# Patient Record
Sex: Male | Born: 1935 | Race: Black or African American | Hispanic: No | Smoking: Former smoker
Health system: Southern US, Community
[De-identification: ages and names within clinical notes are randomized; demographics above are authoritative.]

## PROBLEM LIST (undated history)

## (undated) DIAGNOSIS — M5136 Other intervertebral disc degeneration, lumbar region: Secondary | ICD-10-CM

## (undated) DIAGNOSIS — D494 Neoplasm of unspecified behavior of bladder: Secondary | ICD-10-CM

## (undated) DIAGNOSIS — R296 Repeated falls: Secondary | ICD-10-CM

## (undated) DIAGNOSIS — R079 Chest pain, unspecified: Secondary | ICD-10-CM

## (undated) DIAGNOSIS — J189 Pneumonia, unspecified organism: Secondary | ICD-10-CM

## (undated) DIAGNOSIS — I10 Essential (primary) hypertension: Secondary | ICD-10-CM

## (undated) DIAGNOSIS — E041 Nontoxic single thyroid nodule: Secondary | ICD-10-CM

## (undated) DIAGNOSIS — R0600 Dyspnea, unspecified: Secondary | ICD-10-CM

## (undated) DIAGNOSIS — C61 Malignant neoplasm of prostate: Secondary | ICD-10-CM

## (undated) DIAGNOSIS — G5603 Carpal tunnel syndrome, bilateral upper limbs: Principal | ICD-10-CM

## (undated) DIAGNOSIS — C189 Malignant neoplasm of colon, unspecified: Secondary | ICD-10-CM

## (undated) DIAGNOSIS — R319 Hematuria, unspecified: Secondary | ICD-10-CM

## (undated) DIAGNOSIS — M51369 Other intervertebral disc degeneration, lumbar region without mention of lumbar back pain or lower extremity pain: Secondary | ICD-10-CM

## (undated) DIAGNOSIS — E789 Disorder of lipoprotein metabolism, unspecified: Secondary | ICD-10-CM

## (undated) DIAGNOSIS — I5030 Unspecified diastolic (congestive) heart failure: Secondary | ICD-10-CM

## (undated) DIAGNOSIS — H9190 Unspecified hearing loss, unspecified ear: Secondary | ICD-10-CM

## (undated) DIAGNOSIS — J90 Pleural effusion, not elsewhere classified: Secondary | ICD-10-CM

## (undated) DIAGNOSIS — I447 Left bundle-branch block, unspecified: Secondary | ICD-10-CM

## (undated) DIAGNOSIS — R42 Dizziness and giddiness: Secondary | ICD-10-CM

## (undated) DIAGNOSIS — I517 Cardiomegaly: Secondary | ICD-10-CM

## (undated) DIAGNOSIS — R410 Disorientation, unspecified: Secondary | ICD-10-CM

## (undated) DIAGNOSIS — D369 Benign neoplasm, unspecified site: Secondary | ICD-10-CM

## (undated) DIAGNOSIS — D649 Anemia, unspecified: Secondary | ICD-10-CM

## (undated) DIAGNOSIS — N183 Chronic kidney disease, stage 3 unspecified: Secondary | ICD-10-CM

## (undated) DIAGNOSIS — E119 Type 2 diabetes mellitus without complications: Secondary | ICD-10-CM

## (undated) DIAGNOSIS — M792 Neuralgia and neuritis, unspecified: Secondary | ICD-10-CM

## (undated) DIAGNOSIS — Z8719 Personal history of other diseases of the digestive system: Secondary | ICD-10-CM

## (undated) DIAGNOSIS — M1990 Unspecified osteoarthritis, unspecified site: Secondary | ICD-10-CM

## (undated) HISTORY — DX: Unspecified osteoarthritis, unspecified site: M19.90

## (undated) HISTORY — PX: EYE SURGERY: SHX253

## (undated) HISTORY — PX: COLONOSCOPY: SHX174

## (undated) HISTORY — PX: ESOPHAGOGASTRODUODENOSCOPY: SHX1529

## (undated) HISTORY — DX: Benign neoplasm, unspecified site: D36.9

## (undated) HISTORY — DX: Disorder of lipoprotein metabolism, unspecified: E78.9

## (undated) HISTORY — DX: Carpal tunnel syndrome, bilateral upper limbs: G56.03

## (undated) HISTORY — DX: Unspecified hearing loss, unspecified ear: H91.90

---
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
## 2007-09-17 ENCOUNTER — Inpatient Hospital Stay (HOSPITAL_COMMUNITY): Admission: EM | Admit: 2007-09-17 | Discharge: 2007-09-26 | Payer: Self-pay | Admitting: Emergency Medicine

## 2007-09-18 ENCOUNTER — Encounter: Payer: Self-pay | Admitting: Internal Medicine

## 2007-09-19 ENCOUNTER — Encounter (INDEPENDENT_AMBULATORY_CARE_PROVIDER_SITE_OTHER): Payer: Self-pay | Admitting: *Deleted

## 2007-09-19 ENCOUNTER — Encounter: Payer: Self-pay | Admitting: Internal Medicine

## 2007-09-21 ENCOUNTER — Encounter (INDEPENDENT_AMBULATORY_CARE_PROVIDER_SITE_OTHER): Payer: Self-pay | Admitting: *Deleted

## 2007-09-21 ENCOUNTER — Encounter (INDEPENDENT_AMBULATORY_CARE_PROVIDER_SITE_OTHER): Payer: Self-pay | Admitting: General Surgery

## 2007-09-21 HISTORY — PX: OTHER SURGICAL HISTORY: SHX169

## 2007-09-22 DIAGNOSIS — E041 Nontoxic single thyroid nodule: Secondary | ICD-10-CM

## 2007-09-22 HISTORY — DX: Nontoxic single thyroid nodule: E04.1

## 2007-09-23 ENCOUNTER — Ambulatory Visit: Payer: Self-pay | Admitting: Internal Medicine

## 2007-09-25 ENCOUNTER — Encounter (INDEPENDENT_AMBULATORY_CARE_PROVIDER_SITE_OTHER): Payer: Self-pay | Admitting: *Deleted

## 2007-09-26 ENCOUNTER — Encounter (INDEPENDENT_AMBULATORY_CARE_PROVIDER_SITE_OTHER): Payer: Self-pay | Admitting: *Deleted

## 2007-09-30 ENCOUNTER — Ambulatory Visit: Payer: Self-pay | Admitting: Hematology and Oncology

## 2007-10-21 LAB — CBC WITH DIFFERENTIAL/PLATELET
BASO%: 0 % (ref 0.0–2.0)
EOS%: 2.1 % (ref 0.0–7.0)
LYMPH%: 22.9 % (ref 14.0–48.0)
MCH: 23 pg — ABNORMAL LOW (ref 28.0–33.4)
MCHC: 32.8 g/dL (ref 32.0–35.9)
MCV: 70.1 fL — ABNORMAL LOW (ref 81.6–98.0)
MONO%: 5.4 % (ref 0.0–13.0)
Platelets: 243 10*3/uL (ref 145–400)
RBC: 4.89 10*6/uL (ref 4.20–5.71)
WBC: 6.8 10*3/uL (ref 4.0–10.0)

## 2007-10-21 LAB — COMPREHENSIVE METABOLIC PANEL
ALT: 11 U/L (ref 0–53)
Alkaline Phosphatase: 68 U/L (ref 39–117)
Creatinine, Ser: 1.41 mg/dL (ref 0.40–1.50)
Sodium: 144 mEq/L (ref 135–145)
Total Bilirubin: 0.4 mg/dL (ref 0.3–1.2)
Total Protein: 7.6 g/dL (ref 6.0–8.3)

## 2007-11-25 ENCOUNTER — Ambulatory Visit (HOSPITAL_COMMUNITY): Admission: RE | Admit: 2007-11-25 | Discharge: 2007-11-25 | Payer: Self-pay | Admitting: Urology

## 2008-01-13 ENCOUNTER — Ambulatory Visit (HOSPITAL_COMMUNITY): Admission: RE | Admit: 2008-01-13 | Discharge: 2008-01-13 | Payer: Self-pay | Admitting: Hematology and Oncology

## 2008-01-13 ENCOUNTER — Ambulatory Visit: Payer: Self-pay | Admitting: Hematology and Oncology

## 2008-01-13 ENCOUNTER — Encounter (INDEPENDENT_AMBULATORY_CARE_PROVIDER_SITE_OTHER): Payer: Self-pay | Admitting: *Deleted

## 2008-04-05 ENCOUNTER — Ambulatory Visit: Payer: Self-pay | Admitting: Hematology and Oncology

## 2008-04-07 LAB — COMPREHENSIVE METABOLIC PANEL
ALT: 14 U/L (ref 0–53)
AST: 17 U/L (ref 0–37)
Albumin: 3.8 g/dL (ref 3.5–5.2)
Alkaline Phosphatase: 65 U/L (ref 39–117)
BUN: 38 mg/dL — ABNORMAL HIGH (ref 6–23)
Potassium: 4.1 mEq/L (ref 3.5–5.3)
Sodium: 139 mEq/L (ref 135–145)
Total Protein: 7.5 g/dL (ref 6.0–8.3)

## 2008-04-07 LAB — CBC WITH DIFFERENTIAL/PLATELET
BASO%: 0.4 % (ref 0.0–2.0)
EOS%: 3 % (ref 0.0–7.0)
MCH: 26.9 pg — ABNORMAL LOW (ref 28.0–33.4)
MCHC: 34.2 g/dL (ref 32.0–35.9)
MCV: 78.8 fL — ABNORMAL LOW (ref 81.6–98.0)
MONO%: 5.9 % (ref 0.0–13.0)
RBC: 4.3 10*6/uL (ref 4.20–5.71)
RDW: 17 % — ABNORMAL HIGH (ref 11.2–14.6)

## 2008-06-22 ENCOUNTER — Ambulatory Visit (HOSPITAL_COMMUNITY): Admission: RE | Admit: 2008-06-22 | Discharge: 2008-06-22 | Payer: Self-pay | Admitting: Urology

## 2008-07-09 ENCOUNTER — Ambulatory Visit: Payer: Self-pay | Admitting: Hematology and Oncology

## 2008-07-13 ENCOUNTER — Encounter (INDEPENDENT_AMBULATORY_CARE_PROVIDER_SITE_OTHER): Payer: Self-pay | Admitting: *Deleted

## 2008-07-13 ENCOUNTER — Ambulatory Visit (HOSPITAL_COMMUNITY): Admission: RE | Admit: 2008-07-13 | Discharge: 2008-07-13 | Payer: Self-pay | Admitting: Hematology and Oncology

## 2008-07-13 ENCOUNTER — Encounter: Payer: Self-pay | Admitting: Internal Medicine

## 2008-07-13 LAB — CBC WITH DIFFERENTIAL/PLATELET
BASO%: 0.3 % (ref 0.0–2.0)
EOS%: 2.7 % (ref 0.0–7.0)
HCT: 33.8 % — ABNORMAL LOW (ref 38.7–49.9)
LYMPH%: 27.7 % (ref 14.0–48.0)
MCH: 27.7 pg — ABNORMAL LOW (ref 28.0–33.4)
MCHC: 34.2 g/dL (ref 32.0–35.9)
NEUT%: 62.7 % (ref 40.0–75.0)
Platelets: 220 10*3/uL (ref 145–400)

## 2008-07-13 LAB — COMPREHENSIVE METABOLIC PANEL
ALT: 27 U/L (ref 0–53)
AST: 28 U/L (ref 0–37)
Albumin: 3.3 g/dL — ABNORMAL LOW (ref 3.5–5.2)
BUN: 29 mg/dL — ABNORMAL HIGH (ref 6–23)
CO2: 28 mEq/L (ref 19–32)
Calcium: 9.6 mg/dL (ref 8.4–10.5)
Chloride: 101 mEq/L (ref 96–112)
Potassium: 4 mEq/L (ref 3.5–5.3)

## 2008-07-13 LAB — CEA: CEA: 1.6 ng/mL (ref 0.0–5.0)

## 2008-07-15 ENCOUNTER — Encounter: Payer: Self-pay | Admitting: Internal Medicine

## 2008-08-19 ENCOUNTER — Ambulatory Visit: Payer: Self-pay | Admitting: Internal Medicine

## 2008-08-19 DIAGNOSIS — Z85038 Personal history of other malignant neoplasm of large intestine: Secondary | ICD-10-CM

## 2008-08-19 DIAGNOSIS — C61 Malignant neoplasm of prostate: Secondary | ICD-10-CM

## 2008-08-19 DIAGNOSIS — I10 Essential (primary) hypertension: Secondary | ICD-10-CM

## 2008-08-19 DIAGNOSIS — C189 Malignant neoplasm of colon, unspecified: Secondary | ICD-10-CM | POA: Insufficient documentation

## 2008-08-19 DIAGNOSIS — E785 Hyperlipidemia, unspecified: Secondary | ICD-10-CM

## 2008-08-19 DIAGNOSIS — E119 Type 2 diabetes mellitus without complications: Secondary | ICD-10-CM | POA: Insufficient documentation

## 2008-09-09 ENCOUNTER — Encounter: Payer: Self-pay | Admitting: Internal Medicine

## 2008-09-09 ENCOUNTER — Ambulatory Visit: Payer: Self-pay | Admitting: Internal Medicine

## 2008-09-09 DIAGNOSIS — D369 Benign neoplasm, unspecified site: Secondary | ICD-10-CM

## 2008-09-09 HISTORY — DX: Benign neoplasm, unspecified site: D36.9

## 2008-09-14 ENCOUNTER — Encounter: Payer: Self-pay | Admitting: Internal Medicine

## 2009-01-03 ENCOUNTER — Ambulatory Visit: Payer: Self-pay | Admitting: Hematology and Oncology

## 2009-01-05 LAB — CBC WITH DIFFERENTIAL/PLATELET
BASO%: 0.4 % (ref 0.0–2.0)
LYMPH%: 29.8 % (ref 14.0–49.0)
MCHC: 33.8 g/dL (ref 32.0–36.0)
MONO#: 0.3 10*3/uL (ref 0.1–0.9)
MONO%: 5.3 % (ref 0.0–14.0)
Platelets: 199 10*3/uL (ref 140–400)
RBC: 4.41 10*6/uL (ref 4.20–5.82)
WBC: 5.3 10*3/uL (ref 4.0–10.3)

## 2009-01-05 LAB — COMPREHENSIVE METABOLIC PANEL
ALT: 15 U/L (ref 0–53)
AST: 17 U/L (ref 0–37)
Creatinine, Ser: 1.64 mg/dL — ABNORMAL HIGH (ref 0.40–1.50)
Total Bilirubin: 0.7 mg/dL (ref 0.3–1.2)

## 2009-01-05 LAB — CEA: CEA: 1.5 ng/mL (ref 0.0–5.0)

## 2009-05-18 ENCOUNTER — Ambulatory Visit (HOSPITAL_COMMUNITY): Admission: RE | Admit: 2009-05-18 | Discharge: 2009-05-18 | Payer: Self-pay | Admitting: Urology

## 2009-07-01 ENCOUNTER — Ambulatory Visit: Payer: Self-pay | Admitting: Hematology and Oncology

## 2009-07-05 ENCOUNTER — Ambulatory Visit (HOSPITAL_COMMUNITY): Admission: RE | Admit: 2009-07-05 | Discharge: 2009-07-05 | Payer: Self-pay | Admitting: Hematology and Oncology

## 2009-07-05 LAB — COMPREHENSIVE METABOLIC PANEL
Albumin: 3.4 g/dL — ABNORMAL LOW (ref 3.5–5.2)
Alkaline Phosphatase: 54 U/L (ref 39–117)
CO2: 28 mEq/L (ref 19–32)
Calcium: 9.5 mg/dL (ref 8.4–10.5)
Chloride: 103 mEq/L (ref 96–112)
Glucose, Bld: 166 mg/dL — ABNORMAL HIGH (ref 70–99)
Potassium: 4 mEq/L (ref 3.5–5.3)
Sodium: 138 mEq/L (ref 135–145)
Total Protein: 7.4 g/dL (ref 6.0–8.3)

## 2009-07-05 LAB — CBC WITH DIFFERENTIAL/PLATELET
Eosinophils Absolute: 0.2 10*3/uL (ref 0.0–0.5)
MONO#: 0.3 10*3/uL (ref 0.1–0.9)
MONO%: 5.5 % (ref 0.0–14.0)
NEUT#: 3.5 10*3/uL (ref 1.5–6.5)
RBC: 3.95 10*6/uL — ABNORMAL LOW (ref 4.20–5.82)
RDW: 16.4 % — ABNORMAL HIGH (ref 11.0–14.6)
WBC: 5.4 10*3/uL (ref 4.0–10.3)
lymph#: 1.4 10*3/uL (ref 0.9–3.3)

## 2009-11-05 ENCOUNTER — Observation Stay (HOSPITAL_COMMUNITY): Admission: EM | Admit: 2009-11-05 | Discharge: 2009-11-06 | Payer: Self-pay | Admitting: Emergency Medicine

## 2009-12-30 ENCOUNTER — Ambulatory Visit: Payer: Self-pay | Admitting: Hematology and Oncology

## 2010-01-03 LAB — COMPREHENSIVE METABOLIC PANEL
ALT: 21 U/L (ref 0–53)
AST: 24 U/L (ref 0–37)
Albumin: 3.3 g/dL — ABNORMAL LOW (ref 3.5–5.2)
Calcium: 9.2 mg/dL (ref 8.4–10.5)
Chloride: 103 mEq/L (ref 96–112)
Potassium: 4.3 mEq/L (ref 3.5–5.3)
Sodium: 141 mEq/L (ref 135–145)

## 2010-01-03 LAB — CBC WITH DIFFERENTIAL/PLATELET
BASO%: 0.6 % (ref 0.0–2.0)
EOS%: 3.3 % (ref 0.0–7.0)
MCH: 27.4 pg (ref 27.2–33.4)
MCHC: 33.6 g/dL (ref 32.0–36.0)
RDW: 16.7 % — ABNORMAL HIGH (ref 11.0–14.6)
lymph#: 1.9 10*3/uL (ref 0.9–3.3)

## 2010-04-07 ENCOUNTER — Emergency Department (HOSPITAL_COMMUNITY): Admission: EM | Admit: 2010-04-07 | Discharge: 2010-04-07 | Payer: Self-pay | Admitting: Emergency Medicine

## 2010-09-13 ENCOUNTER — Ambulatory Visit (HOSPITAL_COMMUNITY)
Admission: RE | Admit: 2010-09-13 | Discharge: 2010-09-13 | Payer: Self-pay | Source: Home / Self Care | Admitting: Urology

## 2010-10-03 ENCOUNTER — Ambulatory Visit (HOSPITAL_COMMUNITY)
Admission: RE | Admit: 2010-10-03 | Discharge: 2010-10-03 | Payer: Self-pay | Source: Home / Self Care | Attending: Hematology and Oncology | Admitting: Hematology and Oncology

## 2010-10-03 ENCOUNTER — Ambulatory Visit: Payer: Self-pay | Admitting: Hematology and Oncology

## 2010-10-03 LAB — CBC WITH DIFFERENTIAL/PLATELET
Basophils Absolute: 0 10*3/uL (ref 0.0–0.1)
EOS%: 3.5 % (ref 0.0–7.0)
HGB: 12.3 g/dL — ABNORMAL LOW (ref 13.0–17.1)
MCH: 27 pg — ABNORMAL LOW (ref 27.2–33.4)
MCV: 79.6 fL (ref 79.3–98.0)
MONO%: 6.9 % (ref 0.0–14.0)
RBC: 4.55 10*6/uL (ref 4.20–5.82)
RDW: 17.4 % — ABNORMAL HIGH (ref 11.0–14.6)

## 2010-10-03 LAB — COMPREHENSIVE METABOLIC PANEL
AST: 29 U/L (ref 0–37)
Albumin: 3.5 g/dL (ref 3.5–5.2)
Alkaline Phosphatase: 66 U/L (ref 39–117)
BUN: 34 mg/dL — ABNORMAL HIGH (ref 6–23)
Potassium: 3.9 mEq/L (ref 3.5–5.3)

## 2010-11-12 ENCOUNTER — Encounter: Payer: Self-pay | Admitting: Hematology and Oncology

## 2010-11-13 ENCOUNTER — Ambulatory Visit: Payer: Self-pay | Admitting: Hematology and Oncology

## 2010-11-15 LAB — CBC WITH DIFFERENTIAL/PLATELET
BASO%: 0.5 % (ref 0.0–2.0)
EOS%: 4.1 % (ref 0.0–7.0)
HCT: 36.3 % — ABNORMAL LOW (ref 38.4–49.9)
LYMPH%: 33.3 % (ref 14.0–49.0)
MCH: 26.7 pg — ABNORMAL LOW (ref 27.2–33.4)
MCHC: 33.3 g/dL (ref 32.0–36.0)
NEUT%: 55.3 % (ref 39.0–75.0)
RBC: 4.53 10*6/uL (ref 4.20–5.82)
lymph#: 2 10*3/uL (ref 0.9–3.3)

## 2011-01-07 LAB — CBC
HCT: 33.9 % — ABNORMAL LOW (ref 39.0–52.0)
Hemoglobin: 11.6 g/dL — ABNORMAL LOW (ref 13.0–17.0)
RBC: 4.19 MIL/uL — ABNORMAL LOW (ref 4.22–5.81)
RBC: 4.73 MIL/uL (ref 4.22–5.81)
WBC: 6.5 10*3/uL (ref 4.0–10.5)
WBC: 7.2 10*3/uL (ref 4.0–10.5)

## 2011-01-07 LAB — GLUCOSE, CAPILLARY
Glucose-Capillary: 114 mg/dL — ABNORMAL HIGH (ref 70–99)
Glucose-Capillary: 137 mg/dL — ABNORMAL HIGH (ref 70–99)

## 2011-01-07 LAB — DIFFERENTIAL
Eosinophils Absolute: 0.1 10*3/uL (ref 0.0–0.7)
Eosinophils Relative: 2 % (ref 0–5)
Eosinophils Relative: 2 % (ref 0–5)
Lymphocytes Relative: 27 % (ref 12–46)
Lymphs Abs: 1.7 10*3/uL (ref 0.7–4.0)
Lymphs Abs: 2.2 10*3/uL (ref 0.7–4.0)
Monocytes Absolute: 0.4 10*3/uL (ref 0.1–1.0)
Monocytes Absolute: 0.5 10*3/uL (ref 0.1–1.0)

## 2011-01-07 LAB — CARDIAC PANEL(CRET KIN+CKTOT+MB+TROPI)
CK, MB: 2.9 ng/mL (ref 0.3–4.0)
CK, MB: 3.3 ng/mL (ref 0.3–4.0)
Relative Index: 1.4 (ref 0.0–2.5)
Total CK: 159 U/L (ref 7–232)
Troponin I: 0.06 ng/mL (ref 0.00–0.06)

## 2011-01-07 LAB — COMPREHENSIVE METABOLIC PANEL
ALT: 20 U/L (ref 0–53)
AST: 30 U/L (ref 0–37)
CO2: 27 mEq/L (ref 19–32)
Calcium: 10.2 mg/dL (ref 8.4–10.5)
Chloride: 100 mEq/L (ref 96–112)
GFR calc Af Amer: 39 mL/min — ABNORMAL LOW (ref >60)
GFR calc non Af Amer: 32 mL/min — ABNORMAL LOW (ref >60)
Sodium: 139 mEq/L (ref 135–145)

## 2011-01-07 LAB — URINALYSIS, ROUTINE W REFLEX MICROSCOPIC
Glucose, UA: NEGATIVE mg/dL
Ketones, ur: NEGATIVE mg/dL
Protein, ur: NEGATIVE mg/dL

## 2011-01-07 LAB — CK TOTAL AND CKMB (NOT AT ARMC)
Relative Index: 2.2 (ref 0.0–2.5)
Total CK: 116 U/L (ref 7–232)

## 2011-01-07 LAB — BASIC METABOLIC PANEL
GFR calc Af Amer: 50 mL/min — ABNORMAL LOW (ref >60)
GFR calc non Af Amer: 41 mL/min — ABNORMAL LOW (ref >60)
Potassium: 4 mEq/L (ref 3.5–5.1)
Sodium: 136 mEq/L (ref 135–145)

## 2011-01-07 LAB — POCT CARDIAC MARKERS
CKMB, poc: 1.9 ng/mL (ref 1.0–8.0)
CKMB, poc: 3.2 ng/mL (ref 1.0–8.0)
Myoglobin, poc: 480 ng/mL (ref 12–200)
Troponin i, poc: <0.05 ng/mL (ref 0.00–0.09)

## 2011-01-07 LAB — TROPONIN I: Troponin I: 0.03 ng/mL (ref 0.00–0.06)

## 2011-01-08 LAB — CBC
MCHC: 34.3 g/dL (ref 30.0–36.0)
Platelets: 153 10*3/uL (ref 150–400)
RBC: 3.93 MIL/uL — ABNORMAL LOW (ref 4.22–5.81)
RDW: 16 % — ABNORMAL HIGH (ref 11.5–15.5)

## 2011-01-08 LAB — GLUCOSE, CAPILLARY: Glucose-Capillary: 108 mg/dL — ABNORMAL HIGH (ref 70–99)

## 2011-01-08 LAB — BASIC METABOLIC PANEL
BUN: 27 mg/dL — ABNORMAL HIGH (ref 6–23)
CO2: 26 mEq/L (ref 19–32)
Calcium: 9.1 mg/dL (ref 8.4–10.5)
Creatinine, Ser: 1.57 mg/dL — ABNORMAL HIGH (ref 0.4–1.5)
GFR calc Af Amer: 51 mL/min — ABNORMAL LOW (ref >60)
Glucose, Bld: 99 mg/dL (ref 70–99)

## 2011-03-06 NOTE — Discharge Summary (Signed)
[name], [name]               ACCOUNT NO.:  [id number]   MEDICAL RECORD NO.:  [id number]          PATIENT TYPE:  INP   LOCATION:  [id number]                         FACILITY:  [facility]   PHYSICIAN:  Doree Albee, M.D.DATE OF BIRTH:  Oct 12, 1924   DATE OF ADMISSION:  09/17/2007  DATE OF DISCHARGE:  09/26/2007                               DISCHARGE SUMMARY   FINAL DISCHARGE DIAGNOSES:  1. Colon cancer, status post right colectomy, T3 N0.  2. Hypertension.  3. Diabetes mellitus type 2.  4. Renal insufficiency, resolved.  5. Iron-deficiency anemia secondary to colon cancer as above.   MEDICATIONS ON DISCHARGE:  Glimepiride 10 mg daily, lovastatin 20 mg  daily, Norvasc 10 mg daily, metoprolol 50 mg b.i.d.   CONDITION ON DISCHARGE:  Stable.   HISTORY:  This 77 year old man presented with shortness of breath,  dizziness, weakness and melena for 2 to 3 weeks.  He was found to be  anemic and required blood transfusion of 3 units of packed red blood  cells.  Please see initial history and physical examination done by Dr.  Girard Cooter.   HOSPITAL PROGRESS:  The patient was admitted, and on admission his  hemoglobin was 7.2.  He received 3 units of blood transfusion.  He then  underwent endoscopy with gastroenterology, who found a colon cancer.  He  then was referred to the surgeons, and he was seen by Dr. Fanny Skates.  He underwent a right hemicolectomy, and this was done on  September 21, 2007.  Postoperatively he has done well without any major  problems.  He has been off his antihypertensive medications and these  have been discontinued partly because of his renal dysfunction that he  had when he presented.  This is resolved now and his medications have  been changed to leave out ACE inhibitors and diuretics.  His diabetes  has been reasonably well controlled throughout his hospital stay.  He  was seen by oncology yesterday, who will follow him as an outpatient.  They suggested  doing an ultrasound of the thyroid and check a TSH as he  has a thyroid nodule seen.  Today he is doing well and his vital signs  are stable except for a raised blood pressure of 178/70.  I am adding a  beta blocker regularly to his antihypertensive regimen.   FURTHER DISPOSITION:  He will need to follow up with the oncologist, Dr.  Jamse Arn, and also surgery, Dr. Dalbert Batman, in the near future.  He will be  going home with a rolling walker and home health physical therapy.  His  hemoglobin most recently tested on September 25, 2007 was 9.3, which is  stable.  I have encouraged him to get a primary care physician also.      Doree Albee, M.D.  Electronically Signed    NCG/MEDQ  D:  09/26/2007  T:  09/26/2007  Job:  [id number]

## 2011-03-06 NOTE — H&P (Signed)
NAME:  [name], [name] NO.:  [id number]   MEDICAL RECORD NO.:  [id number]          PATIENT TYPE:  EMS   LOCATION:  ED                           [facility]ACILITY:  [facility]   PHYSICIAN:  Girard Cooter, MD         DATE OF BIRTH:  07/14/1924   DATE OF ADMISSION:  09/17/2007  DATE OF DISCHARGE:                              HISTORY & PHYSICAL   The patient is an 77 year old African American male from out of town,  who presents to [facility] with a chief complaint of  dizziness, weakness and floaty on the feet, accompanied by shortness of  breath that has been getting progressively worse for the last 2 or 3  weeks.  The patient could no longer ambulate today without getting  dizzy.  The patient also states that he has noticed dark black, tarry  stools.  He has not had a recent colonoscopy although he has had  colonoscopy in the past.  I do not have the records in front of me to  know this.  The patient denied describing any chest pain.  He also  denied any other abdominal-type symptoms, no nausea or vomiting, no  hematemesis, no gross blood per rectum.  Otherwise, a 12-point review of  systems is negative.  No focal neurologic deficits.   PAST MEDICAL HISTORY:  1. Hypertension.  2. Hyperlipidemia.  3. Type 2 diabetes, managed with orals.   ALLERGIES:  No known drug allergies.   SOCIAL HISTORY:  The patient lives alone.  Denies drugs, alcohol or  tobacco.   MEDICATIONS:  1. Aspirin 81 mg p.o. daily.  2. Clonidine 0.3 mg p.o. daily.  3. Enalapril 20 mg p.o. daily.  4. Glimepiride 10 mg p.o. daily.  5. Hydrochlorothiazide 25 mg daily.  6. Lovastatin 20 mg p.o. q.h.s.  7. Potassium chloride 20 mEq p.o. daily.   PHYSICAL EXAMINATION:  GENERAL:  The patient is a pleasant 77 year old  African American male in no acute distress.  He is alert and oriented  x3.  HEENT:  Normocephalic, atraumatic.  PERRLA.  Extraocular muscles intact.  Sclerae are anicteric.  Mucous  membranes moist.  NECK:  Supple.  No JVD, no carotid bruits.  CARDIOVASCULAR:  S1, S2, regular rate and rhythm, no murmurs, rubs or  clicks.  ABDOMEN:  Soft, nontender, nondistended, positive bowel sounds.  RECTAL:  No obvious hemorrhoids per rectum.  Stool was tarry, black.  Stool test for occult blood was positive.  LUNGS:  Clear to auscultation bilaterally.  No rhonchi, rales or  wheezes.  EXTREMITIES:  No clubbing, cyanosis, or edema.  NEUROLOGIC: The patient is alert and oriented x3.  Cranial nerves II-XII  grossly intact.  Strength 5/5 bilaterally upper and lower extremities.   LABORATORY DATA:  Hemoglobin 7.2, hematocrit 22.8, white count 9.1,  platelets 461, neutrophils 85.  Urinalysis essentially negative.  Cardiac enzymes x1 negative.  Basic metabolic panel shows a BUN of 27  and creatinine of 1.46.  Chest x-ray shows no acute cardiopulmonary  changes.   ASSESSMENT AND PLAN:  An 77 year old with:  1. Weakness, dizziness/anemia.  Will go ahead and transfuse two units      packed red blood cells, monitor H&H q.6h., GI from [facility]      Gastroenterology will be consulted for a possible upper endoscopy      versus colonoscopy.  2. Hypertension, well-controlled.  Continue to monitor.  3. Hyperlipidemia.  Continue to monitor.  4. Diabetes type 2.  Will hold orals for now/sliding scale insulin,      Lantus at night, for basal coverage.  5. Prerenal azotemia versus secondary to gastrointestinal bleed, acute      on chronic renal insufficiency.  Go ahead and gently hydrate and      monitor electrolytes and renal function.  6. Prophylaxis:  For deep vein thrombosis will start SCD boots, hold      aspirin and all nonsteroidal anti-inflammatory drugs given the      possible gastrointestinal GI bleed.  Hold his oral diabetic      medication given his renal status.  Control blood pressure with      clonidine.  Also hold his ACE inhibitor for now.   DISPOSITION:  Workup for weakness  secondary to most likely anemia in  progress.  The patient will be discharged after GI workup and anemia  workup delineated.  Also, for anemia workup will do a full set of labs  including iron, TIBC, ferritin.  For completeness I will go ahead and  add folate and B12 and reticulocyte count.   Patient a full code.      Girard Cooter, MD  Electronically Signed     RR/MEDQ  D:  09/17/2007  T:  09/17/2007  Job:  [id number]

## 2011-03-06 NOTE — Consult Note (Signed)
NAMELOFTON, DIGIUSEPPE               ACCOUNT NO.:  [id number]   MEDICAL RECORD NO.:  [id number]          PATIENT TYPE:  INP   LOCATION:  [id number]                         [facility]ACILITY:  [facility]   PHYSICIAN:  Eston Esters, MD        DATE OF BIRTH:  10-28-1923   DATE OF CONSULTATION:  09/25/2007  DATE OF DISCHARGE:                                 CONSULTATION   REASON FOR CONSULT:  Colon cancer.   REFERRING PHYSICIAN:  Dr. Dalbert Batman.   HISTORY OF PRESENT ILLNESS:  [name] is a pleasant 77 year old  African American male asked to see for evaluation of colon cancer.  The  patient was visiting his daughter, who works in the [place] processing unit  at [employer], from out of town, when he had to be admitted on  November 26, with shortness of breath, dizziness, and weakness, as well  as a 2-3 week history of melena.  He had no prior GI problems.  The  patient was found to be significantly anemic with a hemoglobin of 7.2,  required a total of 3 units of packed RBC transfusion.  Hemoccult was  positive at the time.  He had a colonoscopy on November 28, which  demonstrated a large malignant mass in the right colon, near the cecum  versus near the ascending colon.  His EGD was negative.  CT of the  abdomen and pelvis on November 28, with contrast, showed a long segment  of irregular colonic wall thickening consistent with malignancy.  These  was located just before the ileocecal valve, extending close to the  hepatic flexure.  He had large pericolonic colonic lymph nodes, as well  as two vague enhancing liver lesions, which looked like vascular  shunting versus hemangioma, but not likely to be malignant.  No  retroperitoneal lymphadenopathy.  Pelvis CT was negative.  He had an  enlarged prostate, as well as sacroiliac joint DJD.  He underwent right  colectomy with pathology of the colon and rectum, diagnostic for  invasive adenocarcinoma, moderately differentiated, negative margins, 4  cm from the  radial margin, no perforation.  The mass maximum size was 6  cm at the ascending colon.  The mass invades through the muscularis  propria into the pericolonic adipose tissue.  No vascular or lymphatic  invasion.  He is T3 N0 MX.  There is no tumor in the 13 mesenteric lymph  nodes resected.  Based on these findings, we were asked to see the  patient with recommendations regarding his care.   PAST MEDICAL HISTORY:  1. History of prostate cancer, diagnosed 15 years ago.  2. Hypertension.  3. Hyperlipidemia.  4. Diabetes mellitus type 2.  5. Protein-calorie malnutrition on admission.  6. BPH.   SURGERY:  Status post right colectomy and resection of fatty tumor of  the ileal mesentery - resection of nodule of ileal mesentery, Dr.  Dalbert Batman, September 21, 2007.   ALLERGIES:  NKDA.   CURRENT MEDICATIONS:  1. Entereg 12 mg b.i.d.  2. Norvasc 5 mg daily.  3. Lovenox 40 mg daily.  4. NovoLog as  directed.  5. Lopressor 5 mg q.6h.  6. Protonix 40 mg b.i.d.  7. Norco 1 to 2 tablets q.3h. p.r.n.  8. Dilaudid p.r.n.  9. Zofran p.r.n.  10.Phenergan p.r.n.   REVIEW OF SYSTEMS:  Remarkable for fatigue, no apparent weight loss, no  anorexia.  No respiratory symptoms, except for mild dyspnea on exertion  secondary to fatigue.  No cardiac complaints.  He has had abdominal  pain, but now his abdominal pain is more incisional.  No nausea or  vomiting.  He had melenic stools upon admission, no bowel movements to  this date.  He has nocturia.  No gross hematuria.  No musculoskeletal  pain.  He had some weakness, generalized, but it was improved  postoperatively.  No dysesthesias.   FAMILY HISTORY:  Noncontributory for colon cancer.   SOCIAL HISTORY:  The patient is single.  He has two daughters.  Retired.  No alcohol or tobacco history.  Lives in [place], at [place], [place].   HEALTH MAINTENANCE:  He had transfusions during this admission, total 3  units.  The last 5 years, he had  a Pneumovax vaccine, and the flu  vaccine was administered October 2008.  He had never had a colonoscopy  until September 19, 2007.   PHYSICAL EXAMINATION:  This is an 77 year old, African-American male in  no acute distress, but lethargic, secondary to the hospitalization.  He  has not been sleeping during the night.  Therefore, he falls back asleep  continuously during the day.  Blood pressure 178/64, pulse 74,  respirations 16, temperature 98, pulse oximetry 94% on room air, 95.3,  height 66 inches.  HEAD:  Normocephalic, atraumatic.  PERRLA.  Oral mucosa without thrush  or lesions.  NECK:  Supple.  No cervical or supraclavicular masses.  Cannot palpate  the described thyroid nodules per CT.  LUNGS:  Clear to auscultation with the exception of a trace of rhonchi  at the bases, clear with cough.  No axillary masses.  CARDIOVASCULAR:  Regular rate and rhythm without murmurs, rubs or  gallops.  ABDOMEN:  Distended.  There is postsurgical tenderness at the incision  area, moderately obese.  No palpable spleen or liver.  GENITOURINARY AND RECTAL:  Deferred.  EXTREMITIES:  No clubbing or cyanosis.  Trace of pedal edema.  SKIN:  Without lesions, bruising or petechiae other than the incisional  region with well healing abdominal scar.  NEUROLOGICAL:  Nonfocal.   LABORATORY DATA:  Hemoglobin 9.3, hematocrit 28, white count 7.4,  platelets 270, neutrophils 7.7, MCV 70.3.  PT 14.7, PTT 34, INR 1.1,  retic count 47.  Iron 18, TIBC 357.  Percent saturation 5, ferritin 8,  [id number], folic acid [id number].  Sodium 138, potassium 3.9, BUN 3 , creatinine  1.26, glucose 163, total bilirubin 0.6, alkaline phosphatase 53, AST 21,  ALT 12, total protein 5.8, albumin 2.3, calcium 8.8.  Hemoccult on  November 26 was positive.  A1c was 5.7.  CEA on November 29 was 2.0.   RADIOLOGIC STUDIES:  See HPI.  In addition, the CT of the chest had also  mentioned on September 24, 2007 that there were no pulmonary nodules  or  masses; there was an enlarged low density right paratracheal lymph node,  nonspecific.  In addition, enlarged thyroid, with multiple nodules.   ASSESSMENT:  Dr. Truddie Coco has seen and evaluated the patient, and the chart  has been reviewed.  This is an 77 year old, African-American male, with  a new diagnosis  of colon cancer, T3 N0 MX.  The patient will be arranged  to be followed up with Dr. Jamse Arn upon discharge.  This will be taking  place as an outpatient.  Of note, to further evaluate his thyroid  enlargement, we will proceed with an ultrasound of the thyroid.  Pending  on the results, further recommendations will proceed.  Consider p.o.  iodine replacement, since his ferritin is noted to be low, indicative of  iron deficiency.   Thank you very much for allowing [place] the opportunity to participate in  the care of Mr. Hutzell.      [name], P.A.      Eston Esters, MD  Electronically Signed    SW/MEDQ  D:  09/26/2007  T:  09/26/2007  Job:  [id number]

## 2011-03-06 NOTE — Consult Note (Signed)
[name], [name]               ACCOUNT NO.:  [id number]   MEDICAL RECORD NO.:  [id number]          PATIENT TYPE:  INP   LOCATION:  [place]                         FACILITY:  [facility]   PHYSICIAN:  Edsel Petrin. Dalbert Batman, M.D.DATE OF BIRTH:  04-Jun-1924   DATE OF CONSULTATION:  DATE OF DISCHARGE:                                 CONSULTATION   REASON FOR CONSULTATION:  Evaluate neoplastic mass of right colon.   HISTORY OF PRESENT ILLNESS:  This is an 77 year old black man who lives  in [place] but whose daughter works in the [place]  processing unit here at [employer].  He was admitted to this hospital  on September 17, 2007 with shortness of breath, dizziness, weakness and  melena for possibly 2-3 weeks.  Denies any abdominal pain or vomiting.  No prior GI problems.   Following admission he was found to be anemic, and I believe he has  required transfusion of what looks like 3 units of packed cells and has  been stable since that time.  He has had a colonoscopy today, which  shows a large malignant mass in the right colon, possibly near the cecum  or possibly mid ascending colon.  He had an upper endoscopy yesterday,  which was negative.  I was called to see him.  He is evaluated in the  recovery area of the endoscopy unit.  He is awake.  His daughter is with  him at his side.  He is in no distress.   PAST HISTORY:  1. Hypertension.  2. Hyperlipidemia.  3. Type 2 diabetes.   SURGICAL HISTORY:  Negative.   CURRENT MEDICATIONS:  1. Aspirin 81 mg a day.  2. Clonidine 0.3 mg a day.  3. Enalapril 20 mg daily.  4. Glimepiride 10 mg daily.  5. Hydrochlorothiazide 25 mg daily.  6. Lovastatin 20 mg at bedtime.  7. Potassium chloride 20 mEq daily.   DRUG ALLERGIES:  None known.   SOCIAL HISTORY:  The patient [place] country in [place].  Denies drugs, alcohol or tobacco. He has two daughers who are visiting  him in the hospital.   FAMILY HISTORY:   Noncontributory.  No obvious GI diseases outlined.   PHYSICAL EXAM:  GENERAL:  A pleasant older African American male in no  acute distress.  VITAL SIGNS:  Blood pressure 153/59, pulse 62, respirations 18, oxygen  saturation 100%.  HEENT:  Sclerae clear.  Extraocular movements intact.  Ears, mouth,  throat, nose, lips, tongue and oropharynx without gross lesions.  NECK:  Supple, nontender.  No mass.  No jugular distention.  No bruits.  HEART:  Regular rate and rhythm.  No murmur.  Radial and femoral pulses  are palpable.  LUNGS:  Clear to auscultation.  No chest wall tenderness.  ABDOMEN:  Slightly protuberant, soft, nontender, nondistended.  Active  bowel sounds.  I do not feel any hernias.  He is nontender.  SKIN:  He has a chronic dermatitis but no active ulcerated lesions and  no infections.  RECTAL EXAM:  Deferred.  Previously done, which showed  hemoccult  positive melanoma.  EXTREMITIES:  He moves all four extremities well without pain or  deformity.  No edema or cyanosis.  NEUROLOGIC:  No gross motor or sensory deficits.   LAB DATA:  Admission hemoglobin 7.2.  Liver function tests normal.  Albumin 2.7.  Hemoglobin this morning is 9.4.   IMAGING:  Portable chest x-ray done on September 17, 2007 shows mild  bibasilar atelectasis.   IMPRESSION:  1. Neoplastic mass of ascending colon, almost certainly this is an      adenocarcinoma.  2. Suspect protein calorie malnutrition.  3. Hypertension.  4. Hyperlipidemia.  5. Diabetes mellitus type 2.   PLAN:  1. A CT scan of the abdomen/pelvis will be performed today.  2. We will tentatively plan for a right colectomy on Sunday, November      30 .  3. We will plan completion of his bowel prep tomorrow, November 29.  4. We will ask for medical clearance for this surgery by the [facility]      medical team.      Edsel Petrin. Dalbert Batman, M.D.  Electronically Signed     HMI/MEDQ  D:  09/19/2007  T:  09/19/2007  Job:  [id number]   cc:    Girard Cooter, MD  Fax: [phone]  Email: [email or web address], [email or web address],FACG  [facility]  [place], [place] [address]

## 2011-03-06 NOTE — H&P (Signed)
NAMEJERRION, [name]               ACCOUNT NO.:  [id number]   MEDICAL RECORD NO.:  [id number]         PATIENT TYPE:  LINP   LOCATION:                               [facility]AC[facility]   PHYSICIAN:  Girard Cooter, MD         DATE OF BIRTH:  07/01/1924   DATE OF ADMISSION:  09/17/2007  DATE OF DISCHARGE:                              HISTORY & PHYSICAL   ADDENDUM:  Please add addendum to past medical problems:  Prostate  cancer for 15 years.   Please add to history of present illness, the patient has never had a  colonoscopy.   Please add:  EKG shows normal sinus rhythm, LVH, nonspecific ST-T wave  changes.      Girard Cooter, MD  Electronically Signed     RR/MEDQ  D:  09/17/2007  T:  09/17/2007  Job:  [id number]

## 2011-03-06 NOTE — Op Note (Signed)
NAMEPOWELL, GRELLE               ACCOUNT NO.:  [id number]   MEDICAL RECORD NO.:  [id number]          PATIENT TYPE:  INP   LOCATION:  [place]                         FACILITY:  [facility]   PHYSICIAN:  Edsel Petrin. Dalbert Batman, M.D.DATE OF BIRTH:  23-Mar-1924   DATE OF PROCEDURE:  09/21/2007  DATE OF DISCHARGE:                               OPERATIVE REPORT   PREOPERATIVE DIAGNOSIS:  Carcinoma of the right colon.   POSTOPERATIVE DIAGNOSES:  1. Carcinoma right colon.  2. Fatty tumor of ileal mesentery.  3. Nodule of ileal mesentery.   OPERATIONS PERFORMED:  1. Laparoscopic-assisted right colectomy.  2. Resection of fatty tumor of ileal mesentery.  3. Resection of nodule, ileal mesentery.   SURGEON:  Fanny Skates, M.D.   FIRST ASSISTANT:  Isabel Caprice. Hassell Done, M.D.   OPERATIVE INDICATIONS:  This is an 77 year old black man who was  admitted to the hospital recently with shortness of breath, weakness,  melena, and anemia.  He underwent blood transfusion.  He had a  colonoscopy which showed a large malignant mass in the right colon.  Had  an upper endoscopy which was normal.  He had a CT scan which showed  thickening of the ascending colon and some suspicious mesenteric  adenopathy.  He has undergone completion of his bowel prep.  He has been  counseled regarding colon resection, which he agrees with.  He is  brought to the operating room electively.   OPERATIVE FINDINGS:  There was a large firm mass in the mid-ascending  colon.  There was no obvious mesenteric adenopathy.  In the mesentery of  the ileum, there was about a 6 cm fatty mass which was not a duplication  cyst and was simply excised with electrocautery and sent for pathology.  Also in the mesentery of the distal ileum, there was about a 5 mm hard  whitish nodule of uncertain etiology.  This was also excised and sent  for routine histology.  I saw no masses of the anterior and superior  surfaces of the left or right lobes of the  liver.  There was no ascites.  Small bowel was collapsed.   OPERATIVE TECHNIQUE:  Following the induction of general endotracheal  anesthesia, the patient was identified as the correct patient and the  correct procedure.  The abdomen was prepped and draped in a sterile  fashion after a Foley catheter had been inserted.  0.5% Marcaine with  epinephrine was used as a local infiltration anesthetic.  A short  vertical incision was made at the superior rim of the umbilicus.  The  fascia was incised in the midline and the abdominal cavity entered under  direct vision.  A 12 mm Hassan trocar was inserted and secured with a  pursestring suture of 0 Vicryl.  Pneumoperitoneum was created.  Video  camera was inserted, with visualization and findings as described above.  Ultimately, I put a 12 mm trocar in the lower midline, a 12 mm trocar in  the upper midline, and two 5 mm trocars, one in the right midabdomen and  one in the left midabdomen.   Following  exploration of the abdomen and pelvis, we positioned the  patient in the Trendelenburg position.  We identified the terminal  ileum, ileocecal valve, and appendix and cecum.  We mobilized the  terminal ileum and cecum and right colon by dividing their lateral  peritoneal attachments.  We used the harmonic scalpel and blunt  dissection.  We slowly mobilized the terminal ileum and cecum and right  colon well off and to the left of the midline.  We identified the  duodenum and avoided injury to that.   We then placed the patient in reverse Trendelenburg position.  We took  down some adhesions between the transverse colon and gallbladder and  took down the hepatic flexure using the harmonic scalpel until we had  the hepatic flexure completely mobilized to where it would come down  into the lower abdomen.  At this point, we thought we had enough  mobilization, and hemostasis was good.  We released the  pneumoperitoneum.  We made a midline incision,  connecting the two port  sites above the umbilicus.  This incision was about 7 or 8 cm in length.  We incised the fascia in the midline.  We placed a wound protector in  the wound.  We brought the terminal ileum and right colon and hepatic  flexure out through the incision, and we had good visualization.  We  also looked at the terminal ileum, and we found a fatty tumor of the mid-  to distal ileum and the mesentery near the ileum.  This was resected  with sharp dissection and electrocautery and was not invading the ileum  or the mesentery.  It looked like a benign fatty tumor.  It was about  this 5 or 6 cm in diameter.  It was sent for routine histology.  Also,  in the terminal ileum mesentery there was about a 5 mm hard whitish  nodule that looked more like a calcified benign mass, but I could not be  sure was not cancer, and so I excised this with electrocautery and sent  it for routine histology.   We examined the specimen that was exteriorized and found that we could  palpate a large tumor, probably 5 cm or greater, in the mid-ascending  colon.  We transected the terminal ileum with a GIA stapling device.  We  transected the right transverse colon with a GIA stapling device.  We  took the mesentery down.  Larger vessels were clamped, divided, and  ligated with 2-0 silk suture ligatures.  Smaller vessels were controlled  with the LigaSure device.  We had excellent hemostasis.  We sent the  specimen to the lab for routine histology.   An anastomosis was created to the terminal ileum and the transverse  colon  using the GIA stapling device.  The defect in the bowel wall was  closed with the TA-60 stapling device.  We inspected the anastomosis  prior to closure, and there was no bleeding.  The proximal and distal  bowel looked pink and healthy.  We placed a few extra sutures of 3-0  silk in the staple lines for hemostasis and at critical points.   We then changed our instruments and  gloves.  We irrigated out the area  of the dissection.  There was good hemostasis by this time.  The  mesentery was closed with interrupted sutures of 2-0 silk.  We returned  the anastomosis to the abdominal cavity.  The fascia was closed with a  running  suture of #1 PDS, and the skin closed with skin staples.   We reinsufflated and reinserted the video camera.  We irrigated a little  bit and in the subphrenic and subhepatic space and paracolic gutter, but  there was really almost no fluid or blood there.  We inspected the  anastomosis.  It looked good.  There was no bleeding from the  anastomosis.  We released the pneumoperitoneum.  We removed the trocars.  The skin incisions were then closed with skin staples.  Clean bandages  were placed and the patient taken to the recovery room in stable  condition.   ESTIMATED BLOOD LOSS:  About 100 mL.   COMPLICATIONS:  None.   SPONGE, NEEDLE, AND INSTRUMENT COUNTS:  Correct.      Edsel Petrin. Dalbert Batman, M.D.  Electronically Signed     HMI/MEDQ  D:  09/21/2007  T:  09/21/2007  Job:  [id number]   cc:   Gatha Mayer, MD,FACG  [facility]  [place], [place] [address]   Girard Cooter, MD  Fax: [phone]  Email: [email or web address]

## 2011-03-24 ENCOUNTER — Emergency Department (HOSPITAL_COMMUNITY)
Admission: EM | Admit: 2011-03-24 | Discharge: 2011-03-25 | Disposition: A | Discharge status: Home / Self Care | Payer: Medicare Other | Attending: Emergency Medicine | Admitting: Emergency Medicine

## 2011-03-24 DIAGNOSIS — E119 Type 2 diabetes mellitus without complications: Secondary | ICD-10-CM | POA: Insufficient documentation

## 2011-03-24 DIAGNOSIS — M79609 Pain in unspecified limb: Secondary | ICD-10-CM | POA: Insufficient documentation

## 2011-03-24 DIAGNOSIS — I10 Essential (primary) hypertension: Secondary | ICD-10-CM | POA: Insufficient documentation

## 2011-03-24 DIAGNOSIS — Z8546 Personal history of malignant neoplasm of prostate: Secondary | ICD-10-CM | POA: Insufficient documentation

## 2011-03-24 DIAGNOSIS — M1990 Unspecified osteoarthritis, unspecified site: Secondary | ICD-10-CM | POA: Insufficient documentation

## 2011-03-24 DIAGNOSIS — Z85038 Personal history of other malignant neoplasm of large intestine: Secondary | ICD-10-CM | POA: Insufficient documentation

## 2011-03-24 DIAGNOSIS — M47817 Spondylosis without myelopathy or radiculopathy, lumbosacral region: Secondary | ICD-10-CM | POA: Insufficient documentation

## 2011-03-25 ENCOUNTER — Emergency Department (HOSPITAL_COMMUNITY): Payer: Medicare Other

## 2011-04-09 ENCOUNTER — Other Ambulatory Visit (HOSPITAL_COMMUNITY): Payer: Self-pay | Admitting: Urology

## 2011-04-09 DIAGNOSIS — C61 Malignant neoplasm of prostate: Secondary | ICD-10-CM

## 2011-04-16 ENCOUNTER — Encounter (HOSPITAL_COMMUNITY): Payer: Self-pay

## 2011-04-16 ENCOUNTER — Encounter (HOSPITAL_COMMUNITY)
Admission: RE | Admit: 2011-04-16 | Discharge: 2011-04-16 | Disposition: A | Discharge status: Home / Self Care | Payer: Medicare Other | Source: Ambulatory Visit | Attending: Urology | Admitting: Urology

## 2011-04-16 DIAGNOSIS — M545 Low back pain, unspecified: Secondary | ICD-10-CM | POA: Insufficient documentation

## 2011-04-16 DIAGNOSIS — M79609 Pain in unspecified limb: Secondary | ICD-10-CM | POA: Insufficient documentation

## 2011-04-16 DIAGNOSIS — R972 Elevated prostate specific antigen [PSA]: Secondary | ICD-10-CM | POA: Insufficient documentation

## 2011-04-16 DIAGNOSIS — C61 Malignant neoplasm of prostate: Secondary | ICD-10-CM | POA: Insufficient documentation

## 2011-04-16 HISTORY — DX: Malignant neoplasm of prostate: C61

## 2011-04-16 MED ORDER — TECHNETIUM TC 99M MEDRONATE IV KIT
24.0000 | PACK | Freq: Once | INTRAVENOUS | Status: AC | PRN
  Administered 2011-04-16: 24 via INTRAVENOUS

## 2011-07-30 LAB — COMPREHENSIVE METABOLIC PANEL
AST: 21
Albumin: 2.3 — ABNORMAL LOW
BUN: 3 — ABNORMAL LOW
CO2: 24
Calcium: 8.8
Chloride: 108
Creatinine, Ser: 1.26
GFR calc Af Amer: >60
GFR calc non Af Amer: 55 — ABNORMAL LOW
Total Bilirubin: 0.6

## 2011-07-30 LAB — CBC
HCT: 28 — ABNORMAL LOW
HCT: 29.8 — ABNORMAL LOW
Hemoglobin: 9.5 — ABNORMAL LOW
Hemoglobin: 9.9 — ABNORMAL LOW
MCHC: 33.1
MCHC: 33.1
MCHC: 33.4
MCV: 70.3 — ABNORMAL LOW
MCV: 70.6 — ABNORMAL LOW
Platelets: 270
RBC: 4.04 — ABNORMAL LOW
RBC: 4.21 — ABNORMAL LOW
RDW: 26.9 — ABNORMAL HIGH
WBC: 9.8

## 2011-07-30 LAB — BASIC METABOLIC PANEL
CO2: 24
CO2: 24
Chloride: 103
GFR calc Af Amer: 56 — ABNORMAL LOW
GFR calc Af Amer: >60
GFR calc non Af Amer: 46 — ABNORMAL LOW
Glucose, Bld: 149 — ABNORMAL HIGH
Potassium: 4.1
Potassium: 4.4
Sodium: 131 — ABNORMAL LOW
Sodium: 131 — ABNORMAL LOW

## 2011-07-31 LAB — HEMOGLOBIN AND HEMATOCRIT, BLOOD
HCT: 29.5 — ABNORMAL LOW
Hemoglobin: 9.8 — ABNORMAL LOW

## 2011-07-31 LAB — URINALYSIS, ROUTINE W REFLEX MICROSCOPIC
Leukocytes, UA: NEGATIVE
Protein, ur: NEGATIVE
Specific Gravity, Urine: 1.01
Urobilinogen, UA: 0.2

## 2011-07-31 LAB — DIFFERENTIAL
Basophils Absolute: 0
Basophils Absolute: 0.1
Eosinophils Relative: 1
Lymphocytes Relative: 11 — ABNORMAL LOW
Lymphocytes Relative: 22
Monocytes Relative: 3
Neutro Abs: 5.7
Neutro Abs: 7.7
Neutrophils Relative %: 67

## 2011-07-31 LAB — PREALBUMIN: Prealbumin: 16 — ABNORMAL LOW

## 2011-07-31 LAB — CEA: CEA: 2

## 2011-07-31 LAB — CBC
HCT: 22.8 — ABNORMAL LOW
HCT: 28.8 — ABNORMAL LOW
Hemoglobin: 7.2 — CL
Hemoglobin: 7.7 — CL
MCHC: 31.6
MCHC: 31.6
MCHC: 33.3
MCV: 62.3 — ABNORMAL LOW
Platelets: 318
Platelets: 326
RBC: 3.66 — ABNORMAL LOW
RBC: 4.05 — ABNORMAL LOW
RDW: 20.9 — ABNORMAL HIGH
RDW: 26.5 — ABNORMAL HIGH
WBC: 8.5
WBC: 9.1

## 2011-07-31 LAB — BASIC METABOLIC PANEL
BUN: 9
CO2: 23
CO2: 25
Calcium: 8.6
Calcium: 8.7
Chloride: 104
Chloride: 107
Creatinine, Ser: 1.46
Creatinine, Ser: 1.58 — ABNORMAL HIGH
GFR calc Af Amer: 56 — ABNORMAL LOW
GFR calc Af Amer: 57 — ABNORMAL LOW
GFR calc non Af Amer: 42 — ABNORMAL LOW
Potassium: 3.9
Potassium: 4.4
Sodium: 135
Sodium: 137

## 2011-07-31 LAB — PROTIME-INR
INR: 1.1
Prothrombin Time: 14.7

## 2011-07-31 LAB — TYPE AND SCREEN: ABO/RH(D): B POS

## 2011-07-31 LAB — COMPREHENSIVE METABOLIC PANEL
Calcium: 8.5
GFR calc Af Amer: 54 — ABNORMAL LOW
GFR calc non Af Amer: 45 — ABNORMAL LOW
Glucose, Bld: 89
Potassium: 4.2
Sodium: 133 — ABNORMAL LOW
Total Bilirubin: 1.1
Total Protein: 6.5

## 2011-07-31 LAB — CROSSMATCH

## 2011-07-31 LAB — URINE MICROSCOPIC-ADD ON

## 2011-07-31 LAB — CARDIAC PANEL(CRET KIN+CKTOT+MB+TROPI)
CK, MB: 2
CK, MB: 2.1
Relative Index: INVALID
Relative Index: INVALID
Total CK: 88
Total CK: 91
Total CK: 98
Troponin I: 0.01
Troponin I: 0.02

## 2011-07-31 LAB — POCT CARDIAC MARKERS
CKMB, poc: 2
Myoglobin, poc: 160
Troponin i, poc: <0.05

## 2011-07-31 LAB — FERRITIN: Ferritin: 8 — ABNORMAL LOW (ref 22–322)

## 2011-07-31 LAB — VITAMIN B12: Vitamin B-12: 689 (ref 211–911)

## 2011-07-31 LAB — IRON AND TIBC: UIBC: 339

## 2011-07-31 LAB — HEMOGLOBIN: Hemoglobin: 10 — ABNORMAL LOW

## 2011-07-31 LAB — HEMOGLOBIN A1C
Hgb A1c MFr Bld: 5.7
Mean Plasma Glucose: 126

## 2011-07-31 LAB — URINE CULTURE

## 2011-07-31 LAB — ABO/RH: ABO/RH(D): B POS

## 2011-08-14 ENCOUNTER — Other Ambulatory Visit: Payer: Self-pay | Admitting: Hematology and Oncology

## 2011-08-14 ENCOUNTER — Encounter (HOSPITAL_BASED_OUTPATIENT_CLINIC_OR_DEPARTMENT_OTHER): Payer: Medicare Other | Admitting: Hematology and Oncology

## 2011-08-14 DIAGNOSIS — C182 Malignant neoplasm of ascending colon: Secondary | ICD-10-CM

## 2011-08-14 DIAGNOSIS — N289 Disorder of kidney and ureter, unspecified: Secondary | ICD-10-CM

## 2011-08-14 LAB — CBC WITH DIFFERENTIAL/PLATELET
BASO%: 0.3 % (ref 0.0–2.0)
Eosinophils Absolute: 0.2 10*3/uL (ref 0.0–0.5)
HCT: 35.4 % — ABNORMAL LOW (ref 38.4–49.9)
LYMPH%: 32.6 % (ref 14.0–49.0)
MCHC: 33.8 g/dL (ref 32.0–36.0)
MONO#: 0.4 10*3/uL (ref 0.1–0.9)
NEUT#: 3 10*3/uL (ref 1.5–6.5)
NEUT%: 57.1 % (ref 39.0–75.0)
Platelets: 217 10*3/uL (ref 140–400)
WBC: 5.3 10*3/uL (ref 4.0–10.3)
lymph#: 1.7 10*3/uL (ref 0.9–3.3)

## 2011-08-14 LAB — COMPREHENSIVE METABOLIC PANEL
ALT: 10 U/L (ref 0–53)
CO2: 29 mEq/L (ref 19–32)
Calcium: 9.5 mg/dL (ref 8.4–10.5)
Chloride: 101 mEq/L (ref 96–112)
Glucose, Bld: 217 mg/dL — ABNORMAL HIGH (ref 70–99)
Sodium: 139 mEq/L (ref 135–145)
Total Bilirubin: 0.6 mg/dL (ref 0.3–1.2)
Total Protein: 7.4 g/dL (ref 6.0–8.3)

## 2011-08-14 LAB — CEA: CEA: 3.4 ng/mL (ref 0.0–5.0)

## 2011-08-17 ENCOUNTER — Telehealth: Payer: Self-pay | Admitting: Internal Medicine

## 2011-08-17 ENCOUNTER — Other Ambulatory Visit: Payer: Self-pay | Admitting: Hematology and Oncology

## 2011-08-17 ENCOUNTER — Encounter (HOSPITAL_BASED_OUTPATIENT_CLINIC_OR_DEPARTMENT_OTHER): Payer: Medicare Other | Admitting: Hematology and Oncology

## 2011-08-17 DIAGNOSIS — C189 Malignant neoplasm of colon, unspecified: Secondary | ICD-10-CM

## 2011-08-17 DIAGNOSIS — C182 Malignant neoplasm of ascending colon: Secondary | ICD-10-CM

## 2011-08-17 NOTE — Telephone Encounter (Signed)
Left message for Crystal to call back. 

## 2011-08-20 NOTE — Telephone Encounter (Signed)
I placed the recall (likely) due to history of colon cancer but would always recommend an REV to assess appropriateness as you have suggested - so REV if colonoscopy desired but may decide against

## 2011-08-20 NOTE — Telephone Encounter (Signed)
I have left a message for [name] with Dr Celesta Aver recommendations.  I have attempted to reach the patient, but the person that answers the phone at the number states he does not live there.  I have asked [name] to call back and set up an appt if they desire the patient to have a colon

## 2011-08-20 NOTE — Telephone Encounter (Signed)
I have advised Crystal that we don't do routine colon cancer screenings on patient's of this age.  If Dr Jamse Arn is wanting him to have a colon then we can schedule an office visit for him to come in and discuss.  Dr Carlean Purl there is a recall in for 09/2011.  Should this be?  He does have a history of colon CA and last colon was in 08/2008 and had 3 polyps.  I have a message in to Dr Hollice Espy nurse to call back to discuss

## 2011-10-15 ENCOUNTER — Telehealth: Payer: Self-pay | Admitting: Hematology and Oncology

## 2011-10-15 NOTE — Telephone Encounter (Signed)
Converted 08/14/2012 appt to [facility] and moved to LO due to RJ's absence. S/w pt's son in law re new time for 10/24 @ 11:30 am and confirmed 10/21 lb/ct.

## 2012-07-16 ENCOUNTER — Encounter: Payer: Self-pay | Admitting: Internal Medicine

## 2012-07-24 ENCOUNTER — Encounter: Payer: Self-pay | Admitting: Internal Medicine

## 2012-08-11 ENCOUNTER — Other Ambulatory Visit (HOSPITAL_BASED_OUTPATIENT_CLINIC_OR_DEPARTMENT_OTHER): Payer: Medicare Other | Admitting: Lab

## 2012-08-11 ENCOUNTER — Other Ambulatory Visit: Payer: Self-pay | Admitting: Hematology and Oncology

## 2012-08-11 ENCOUNTER — Ambulatory Visit (HOSPITAL_COMMUNITY)
Admission: RE | Admit: 2012-08-11 | Discharge: 2012-08-11 | Disposition: A | Discharge status: Home / Self Care | Payer: Medicare Other | Source: Ambulatory Visit | Attending: Hematology and Oncology | Admitting: Hematology and Oncology

## 2012-08-11 ENCOUNTER — Encounter: Payer: Self-pay | Admitting: *Deleted

## 2012-08-11 ENCOUNTER — Encounter (HOSPITAL_COMMUNITY): Payer: Self-pay

## 2012-08-11 DIAGNOSIS — C189 Malignant neoplasm of colon, unspecified: Secondary | ICD-10-CM

## 2012-08-11 DIAGNOSIS — C61 Malignant neoplasm of prostate: Secondary | ICD-10-CM | POA: Insufficient documentation

## 2012-08-11 DIAGNOSIS — I10 Essential (primary) hypertension: Secondary | ICD-10-CM | POA: Insufficient documentation

## 2012-08-11 DIAGNOSIS — E119 Type 2 diabetes mellitus without complications: Secondary | ICD-10-CM | POA: Insufficient documentation

## 2012-08-11 DIAGNOSIS — I2510 Atherosclerotic heart disease of native coronary artery without angina pectoris: Secondary | ICD-10-CM | POA: Insufficient documentation

## 2012-08-11 DIAGNOSIS — C182 Malignant neoplasm of ascending colon: Secondary | ICD-10-CM

## 2012-08-11 DIAGNOSIS — E049 Nontoxic goiter, unspecified: Secondary | ICD-10-CM | POA: Insufficient documentation

## 2012-08-11 HISTORY — DX: Type 2 diabetes mellitus without complications: E11.9

## 2012-08-11 HISTORY — DX: Essential (primary) hypertension: I10

## 2012-08-11 HISTORY — DX: Malignant neoplasm of colon, unspecified: C18.9

## 2012-08-11 LAB — COMPREHENSIVE METABOLIC PANEL (CC13)
Albumin: 3.3 g/dL — ABNORMAL LOW (ref 3.5–5.0)
BUN: 31 mg/dL — ABNORMAL HIGH (ref 7.0–26.0)
CO2: 27 mEq/L (ref 22–29)
Calcium: 9.9 mg/dL (ref 8.4–10.4)
Chloride: 104 mEq/L (ref 98–107)
Glucose: 123 mg/dl — ABNORMAL HIGH (ref 70–99)
Potassium: 3.8 mEq/L (ref 3.5–5.1)
Total Protein: 7.7 g/dL (ref 6.4–8.3)

## 2012-08-11 LAB — CBC WITH DIFFERENTIAL/PLATELET
BASO%: 0.6 % (ref 0.0–2.0)
EOS%: 5.7 % (ref 0.0–7.0)
HCT: 35.8 % — ABNORMAL LOW (ref 38.4–49.9)
MCH: 27.5 pg (ref 27.2–33.4)
MCHC: 33.6 g/dL (ref 32.0–36.0)
MONO#: 0.4 10*3/uL (ref 0.1–0.9)
RBC: 4.37 10*6/uL (ref 4.20–5.82)
RDW: 16.2 % — ABNORMAL HIGH (ref 11.0–14.6)
WBC: 5.1 10*3/uL (ref 4.0–10.3)
lymph#: 1.4 10*3/uL (ref 0.9–3.3)

## 2012-08-11 MED ORDER — IOHEXOL 300 MG/ML  SOLN
100.0000 mL | Freq: Once | INTRAMUSCULAR | Status: AC | PRN
  Administered 2012-08-11: 100 mL via INTRAVENOUS

## 2012-08-12 ENCOUNTER — Other Ambulatory Visit: Payer: Self-pay | Admitting: *Deleted

## 2012-08-12 ENCOUNTER — Telehealth: Payer: Self-pay | Admitting: *Deleted

## 2012-08-12 LAB — CEA: CEA: 2.2 ng/mL (ref 0.0–5.0)

## 2012-08-12 NOTE — Telephone Encounter (Signed)
Received message from daughter Lukis Rota requesting a rescheduled appt for pt .  Called Festus Holts back and was informed that pt will be out of town for funeral and will not be back until [facility] 08/18/12.   Informed Festus Holts that md will be notified on 08/13/12.  Festus Holts understood that a scheduler will contact her with pt's new appt date and time.

## 2012-08-13 ENCOUNTER — Other Ambulatory Visit: Payer: Self-pay | Admitting: *Deleted

## 2012-08-14 ENCOUNTER — Ambulatory Visit: Payer: Medicare Other | Admitting: Physician Assistant

## 2012-08-14 ENCOUNTER — Ambulatory Visit: Payer: Medicare Other | Admitting: Hematology and Oncology

## 2012-08-25 ENCOUNTER — Ambulatory Visit: Payer: Medicare Other | Admitting: Internal Medicine

## 2012-09-02 ENCOUNTER — Encounter: Payer: Self-pay | Admitting: Internal Medicine

## 2012-09-02 ENCOUNTER — Ambulatory Visit (INDEPENDENT_AMBULATORY_CARE_PROVIDER_SITE_OTHER): Payer: Medicare Other | Admitting: Internal Medicine

## 2012-09-02 VITALS — BP 138/60 | HR 78 | Ht 67.0 in | Wt 269.0 lb

## 2012-09-02 DIAGNOSIS — Z85038 Personal history of other malignant neoplasm of large intestine: Secondary | ICD-10-CM

## 2012-09-02 DIAGNOSIS — K862 Cyst of pancreas: Secondary | ICD-10-CM

## 2012-09-02 DIAGNOSIS — K863 Pseudocyst of pancreas: Secondary | ICD-10-CM

## 2012-09-02 NOTE — Patient Instructions (Addendum)
Please call Dr. Jamse Arn for an appointment.  The # is [phone].    You do not need a colonoscopy.   Thank you for choosing me and [facility].  Gatha Mayer, M.D., [facility]

## 2012-09-02 NOTE — Progress Notes (Signed)
  Subjective:    Patient ID: [name], male    DOB: 21-Jun-1924, 78 y.o.   MRN: [id number]  HPI The patient returns with his granddaughter for followup, he has a personal history of colon cancer and adenomatous colon polyps. His colon cancer was found in 2008 and treated with a right hemicolectomy, it was stage II with no lymph nodes. He did not have chemotherapy. Recent CT scanning showed no evidence of colon cancer asked to see for recurrence. He did have a new he describes cystic lesion in the pancreas that was most likely not picked up on previous CT scans and was thought to be increasing in size, it was fluid density and thought to be a cystic lesion.  He had been constipated but took a stool softener and says he's had no problems in the last 3-4 months.  He is not interested in pursuing routine colonoscopy to his age. He is still functional but needs a walker to walk much of the time, and feels like he is somewhat frail. He is gaining weight. Medications, allergies, past medical history, past surgical history, family history and social history are reviewed and updated in the EMR.   Review of Systems As above and he also complains of frequent urination and some urinary leakage.    Objective:   Physical Exam General:  NAD Eyes:   anicteric Lungs:  Scattered wheezes with good air movement Heart:  S1S2 no rubs, murmurs or gallops Abdomen:  soft and nontender, BS+ obese with well-healed midline upper abdominal scar     Data Reviewed:  previous oncology notes, CT scanning including images, and as above in the history of present illness Lab Results  Component Value Date   WBC 5.1 08/11/2012   HGB 12.0* 08/11/2012   HCT 35.8* 08/11/2012   MCV 81.8 08/11/2012   PLT 207 08/11/2012   Lab Results  Component Value Date   CEA 2.2 08/11/2012           Assessment & Plan:   1. Personal history of colon cancer   2. Pancreatic cyst    1. The patient decided not to pursue  routine surveillance and screening colonoscopy. I think this is appropriate given his age, comorbidities and functional status. He understands there is always a risk of recurrent or subsequent new colon cancer. The odds of colonoscopy having an impact on extending his white expectancy are minimal at best. 2. The pancreatic cystic lesion may be a neoplasm, however at his age, just this and routine colonoscopy I do not think it makes sense to work this up further as the odds of infecting his life expectancy seem minimal at best. He will followup with his oncologist, Dr. Jamse Arn. 3. He will see me on an as-needed basis.  CC: Elyn Peers, MD DR. Jamse Arn

## 2012-09-03 ENCOUNTER — Telehealth: Payer: Self-pay | Admitting: Nurse Practitioner

## 2012-09-03 ENCOUNTER — Telehealth: Payer: Self-pay | Admitting: Hematology and Oncology

## 2012-09-03 NOTE — Telephone Encounter (Signed)
Received vm re pt needing an appt w/LO. Person leaving message not identified. Message forwarded to desk nurse.

## 2012-09-03 NOTE — Telephone Encounter (Signed)
Called patient and offered appoitnment for this afternoon.  Pt declined.  Per Dr. Jamse Arn POF sent for appointment in January.

## 2012-09-03 NOTE — Telephone Encounter (Signed)
Received message from [facility]- re: pt called requesting followup appointment to be scheduled.  Information forwarded to Dr. Jamse Arn for date and time.

## 2012-09-04 ENCOUNTER — Telehealth: Payer: Self-pay | Admitting: *Deleted

## 2012-09-04 NOTE — Telephone Encounter (Signed)
Patient confirmed over the phone the new date and time on 10-24-2012 starting at 10:15

## 2012-10-11 ENCOUNTER — Telehealth: Payer: Self-pay | Admitting: Oncology

## 2012-10-11 NOTE — Telephone Encounter (Signed)
lmonvm adviisng the pt/caregiver to call me back regarding an appt change.

## 2012-10-13 ENCOUNTER — Telehealth: Payer: Self-pay | Admitting: Oncology

## 2012-10-13 NOTE — Telephone Encounter (Signed)
Pt's dtr called back this morning re call they received on 12/21 about rescheduling appt. dtr was informed that LO would no longer be with [place] come January and that we were calling to inform pt and reassign a new provider. dtr given new appt for 1/3 for lb/KC and made aware that her father's new provider will be Dr. Alen Blew.

## 2012-10-23 ENCOUNTER — Other Ambulatory Visit: Payer: Self-pay | Admitting: Oncology

## 2012-10-23 DIAGNOSIS — C61 Malignant neoplasm of prostate: Secondary | ICD-10-CM

## 2012-10-24 ENCOUNTER — Ambulatory Visit: Payer: Medicare Other | Admitting: Hematology and Oncology

## 2012-10-24 ENCOUNTER — Telehealth: Payer: Self-pay | Admitting: Oncology

## 2012-10-24 ENCOUNTER — Encounter: Payer: Self-pay | Admitting: Oncology

## 2012-10-24 ENCOUNTER — Other Ambulatory Visit (HOSPITAL_BASED_OUTPATIENT_CLINIC_OR_DEPARTMENT_OTHER): Payer: PRIVATE HEALTH INSURANCE | Admitting: Lab

## 2012-10-24 ENCOUNTER — Ambulatory Visit: Payer: Medicare Other | Admitting: Oncology

## 2012-10-24 ENCOUNTER — Ambulatory Visit (HOSPITAL_BASED_OUTPATIENT_CLINIC_OR_DEPARTMENT_OTHER): Payer: PRIVATE HEALTH INSURANCE | Admitting: Oncology

## 2012-10-24 VITALS — BP 182/64 | HR 63 | Temp 97.6°F | Resp 20 | Ht 67.0 in | Wt 228.1 lb

## 2012-10-24 DIAGNOSIS — Z85038 Personal history of other malignant neoplasm of large intestine: Secondary | ICD-10-CM

## 2012-10-24 DIAGNOSIS — C61 Malignant neoplasm of prostate: Secondary | ICD-10-CM

## 2012-10-24 DIAGNOSIS — C182 Malignant neoplasm of ascending colon: Secondary | ICD-10-CM

## 2012-10-24 LAB — COMPREHENSIVE METABOLIC PANEL (CC13)
Albumin: 3.3 g/dL — ABNORMAL LOW (ref 3.5–5.0)
BUN: 43 mg/dL — ABNORMAL HIGH (ref 7.0–26.0)
Calcium: 9.9 mg/dL (ref 8.4–10.4)
Chloride: 103 mEq/L (ref 98–107)
Creatinine: 1.7 mg/dL — ABNORMAL HIGH (ref 0.7–1.3)
Glucose: 115 mg/dl — ABNORMAL HIGH (ref 70–99)
Potassium: 3.6 mEq/L (ref 3.5–5.1)

## 2012-10-24 LAB — CBC WITH DIFFERENTIAL/PLATELET
Basophils Absolute: 0 10*3/uL (ref 0.0–0.1)
Eosinophils Absolute: 0.2 10*3/uL (ref 0.0–0.5)
HCT: 36 % — ABNORMAL LOW (ref 38.4–49.9)
HGB: 12.4 g/dL — ABNORMAL LOW (ref 13.0–17.1)
MCH: 28 pg (ref 27.2–33.4)
MCV: 81.5 fL (ref 79.3–98.0)
NEUT#: 2.5 10*3/uL (ref 1.5–6.5)
NEUT%: 51.2 % (ref 39.0–75.0)
RDW: 16 % — ABNORMAL HIGH (ref 11.0–14.6)
lymph#: 1.8 10*3/uL (ref 0.9–3.3)

## 2012-10-24 LAB — CEA: CEA: 1.8 ng/mL (ref 0.0–5.0)

## 2012-10-24 NOTE — Telephone Encounter (Signed)
appts made and printed for pt aom °

## 2012-10-24 NOTE — Progress Notes (Signed)
Hematology and Oncology Follow Up Visit  [name] [id number] 12-07-1923 79 y.o. 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 0/13 lymph nodes positive.  Prior Therapy:  S/P laparoscopic right hemicolectomy on 09/21/07.  Current therapy: Observation  Interim History:  [name] returns for routine follow-up with his granddaughter. It has been over 1 year since his last visit in our office. He has been doing well overall. Appetite is good and weight is up by 6 lbs since his last visit here in October 2012. Denies abdominal pain, nausea, vomiting. No change in his bowel habits. He has not noticed any hematuria or melena. He was seen by Dr Carlean Purl within the past few months and elected not to undergo a colonoscopy for routine screening.   Medications: I have reviewed the patient's current medications. Current outpatient prescriptions:amLODipine (NORVASC) 10 MG tablet, Take 10 mg by mouth daily., Disp: , Rfl: ;  atorvastatin (LIPITOR) 40 MG tablet, Take 40 mg by mouth daily., Disp: , Rfl: ;  chlorthalidone (HYGROTON) 25 MG tablet, Take 25 mg by mouth daily., Disp: , Rfl: ;  glimepiride (AMARYL) 4 MG tablet, Take 4 mg by mouth daily before breakfast., Disp: , Rfl: ;  pioglitazone (ACTOS) 15 MG tablet, Take 15 mg by mouth daily., Disp: , Rfl:  potassium chloride SA (K-DUR,KLOR-CON) 20 MEQ tablet, Take 20 mEq by mouth daily., Disp: , Rfl:   Allergies: No Known Allergies  Past Medical History, Surgical history, Social history, and Family History were reviewed and updated.  Review of Systems: Constitutional:  Negative for fever, chills, night sweats, anorexia, weight loss, pain. Cardiovascular: no chest pain or dyspnea on exertion Respiratory: no cough, shortness of breath, or wheezing Neurological: no TIA or stroke symptoms Dermatological:  negative ENT: negative Skin: Negative. Gastrointestinal: no abdominal pain, change in bowel habits, or black or bloody stools Genito-Urinary: no dysuria, trouble voiding, or hematuria Hematological and Lymphatic: negative Breast: negative for breast lumps Musculoskeletal: negative Remaining ROS negative.  Physical Exam: Blood pressure 182/64, pulse 63, temperature 97.6 F (36.4 C), temperature source Oral, resp. rate 20, height 5\' 7"  (1.702 m), weight 228 lb 1.6 oz (103.465 kg). ECOG: 1 General appearance: alert, cooperative and no distress Head: Normocephalic, without obvious abnormality, atraumatic Neck: no adenopathy, no carotid bruit, no JVD, supple, symmetrical, trachea midline and thyroid not enlarged, symmetric, no tenderness/mass/nodules Lymph nodes: Cervical, supraclavicular, and axillary nodes normal. Heart:regular rate and rhythm, S1, S2 normal, no murmur, click, rub or gallop Lung:chest clear, no wheezing, rales, normal symmetric air entry, no tachypnea, retractions or cyanosis Abdomen: soft, non-tender, without masses or organomegaly EXT:no erythema, induration, or nodules   Lab Results: Lab Results  Component Value Date   WBC 4.9 10/24/2012   HGB 12.4* 10/24/2012   HCT 36.0* 10/24/2012   MCV 81.5 10/24/2012   PLT 202 10/24/2012     Chemistry      Component Value Date/Time   NA 141 08/11/2012 0904   NA 139 08/14/2011 1319   NA 139 08/14/2011 1319   K 3.8 08/11/2012 0904   K 3.9 08/14/2011 1319   K 3.9 08/14/2011 1319   CL 104 08/11/2012 0904   CL 101 08/14/2011 1319   CL 101 08/14/2011 1319   CO2 27 08/11/2012 0904   CO2 29 08/14/2011 1319   CO2 29 08/14/2011 1319   BUN 31.0* 08/11/2012  0904   BUN 33* 08/14/2011 1319   BUN 33* 08/14/2011 1319   CREATININE 1.7* 08/11/2012 0904   CREATININE 1.70* 08/14/2011 1319   CREATININE 1.70* 08/14/2011 1319      Component Value Date/Time   CALCIUM 9.9 08/11/2012 0904   CALCIUM 9.5 08/14/2011 1319   CALCIUM 9.5  08/14/2011 1319   ALKPHOS 65 08/11/2012 0904   ALKPHOS 55 08/14/2011 1319   ALKPHOS 55 08/14/2011 1319   AST 18 08/11/2012 0904   AST 18 08/14/2011 1319   AST 18 08/14/2011 1319   ALT 14 08/11/2012 0904   ALT 10 08/14/2011 1319   ALT 10 08/14/2011 1319   BILITOT 0.50 08/11/2012 0904   BILITOT 0.6 08/14/2011 1319   BILITOT 0.6 08/14/2011 1319     Imaging:  *RADIOLOGY REPORT*  Clinical Data: Colon cancer. Prostate cancer. Hypertension.  Diabetes.  CT CHEST, ABDOMEN AND PELVIS WITH CONTRAST  Technique: Multidetector CT imaging of the chest, abdomen and  pelvis was performed following the standard protocol during bolus  administration of intravenous contrast.  Contrast: 144mL OMNIPAQUE IOHEXOL 300 MG/ML SOLN  Comparison: Multiple exams, including 04/16/2011, 12/29/2010, and  10/03/2010  CT CHEST  Findings: Scattered small thyroid nodules are well under 1 cm in  diameter.  Atherosclerotic calcification of the thoracic aorta noted.  Coronary artery atherosclerosis is present.  Anterior mediastinal lymph node measures 7 mm in short axis. Left  infrahilar node measures 8 mm in short axis. No overt pathologic  adenopathy observed.  No pleural effusion. The right subclavian vein and upper SVC are  slit-like and small in caliber, with some venous collaterals  extending in the right breast and right shoulder region.  Stable scarring anteriorly in the right lower lobe noted. No  significant appearing pulmonary nodule observed.  Thoracic spondylosis is present.  IMPRESSION:  1. Small caliber right subclavian vein and upper SVC, suspicious  for stenosis. Collateral vessels noted.  2. Stable scarring in the right lower lobe.  3. Scattered small thyroid nodules, likely incidental.  CT ABDOMEN AND PELVIS  Findings: 1.4 cm hypodense lesion in segment 7 of the liver on  image 40 of series 2, stable from 07/13/2000 and accordingly  unlikely to represent malignancy. Liver otherwise  unremarkable.  The spleen likewise stable from 2009 and without significant  abnormality. The adrenal glands and gallbladder appear within  normal limits.  The fluid density lesion and partially exophytic from the uncinate  process of the pancreas measures 2.7 x 1.2 cm on image 60 of series  2. I suspect that this is arising from the pancreas rather than  representing a duodenal diverticulum, and the appearance is more  conspicuous than on prior exams due to both increasing size and use  of IV contrast.  Bilateral nonobstructive nephrolithiasis appears stable.  Aortoiliac atherosclerotic calcification noted. No pathologic  retroperitoneal or porta hepatis adenopathy is identified.  No pathologic pelvic adenopathy is identified.  The bladder base is indented by the enlarged median lobe of the  prostate gland.  Asymmetric degenerative arthropathy left hip noted, with loss of  articular space and degenerative subcortical cyst formation.  Mildly congenitally short pedicles noted in the lumbar spine, with  spondylosis and degenerative disc disease believed to be  contributing to potential impingement at all levels between L2 and  S1. A transitional lumbosacral vertebra is assumed to represent the  S1 level.  Incidentally, the right hepatic lobe appears to be supplied by the  superior mesenteric artery branch.  IMPRESSION:  1. Increased size and conspicuity of a cystic lesion extending  inferiorly from the uncinate process of the pancreas, measuring  about 2.7 x 1.2 cm. Although a post inflammatory lesion could have  this appearance, entities such as intraductal papillary mucinous  tumor are not excluded. This could be further investigated by MRI  with and without contrast, or endoscopic ultrasound.  2. Prominent prostate gland indents the bladder base.  3. Lumbar spondylosis and degenerative disc disease contributing  to multilevel impingement in the lumbar spine.  4. 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  5. Asymmetric degenerative left hip arthropathy.  Original Report Authenticated By: Carron Curie, M.D.   Impression and Plan:  This is an 79 year old gentleman with the following issues:  1. Stage II, T3 N0, M0 colon cancer. S/P right hemicolectomy in 2008. He has no evidence of recurrence based on history, exam, or labs today. Recommend continued observation. He has declined a colonoscopy.  2. History of prostate cancer. Diagnosed in 1993. PSA is followed by PCP. No symptoms of disease at this time.  3. HTN. On Amlodipine per PCP.  4. DM. On Actos per PCP.  5. Hyperlipidemia. On Lipitor per PCP.  6. Pancreas lesion on CT scan. Patient is asymptomatic. CT reviewed by Dr Alen Blew and this area most likely represents a cyst. No additional work-up at this time.  7. Follow-up. In 1 year.  Spent more than half the time coordinating care.    [name] 1/3/201410:54 AM

## 2013-05-28 ENCOUNTER — Other Ambulatory Visit: Payer: Self-pay | Admitting: Ophthalmology

## 2013-05-28 NOTE — H&P (Signed)
Patient Record  Gabin, Schwieterman  Patient Number:  [id number] Date of Birth:  [birth year] Age:  79 years old    Gender:  male Date of Evaluation:  May 28, 2013  Chief Complaint:   79 year old male refered by Dr. Criss Rosales, c/o all of a noticed not being able to see out left eye. History of Present Illness:   79 yo male c/o sudden loss of vision os x 5 days ago.  No p ain or discomfort.  N o spots or flashes.  Diabetic.  CHO controlled. Past History:  Allergies:  nkda, Active Medications:   Other Medications:  Amitiza (lubiprostone) by Ara Kussmaul, capsule 24 mcg, amlodipine (amlodipine) by Ara Kussmaul, tablet 10 mg, pioglitazone (pioglitazone) by Ara Kussmaul, tablet 15 mg, meloxicam (meloxicam) by Ara Kussmaul, tablet 7.5 mg, chlorthalidone (chlorthalidone) by Ara Kussmaul, tablet 25 mg, atorvastatin (atorvastatin) by Ara Kussmaul, tablet 40 mg Birth History:  none Past Ocular History:   cataract removed od Past Medical History:   Hypertension diabetes colon cancer prostate cancer Past Surgical History:   cancer Family History:  no amblyopia, no blindness, no cataracts, no crossed eyes, no diabetic retinopathy, no glaucoma, no macular degeneration, no retinal detachment, + cancer (nephew), + diabetes (nephew, niece), + heart disease (brother), + high blood pressure (brother), + stroke (brother, nephew) Social History:   Smoking Status: former smoker  Alcohol:  none   Driving status:  driving Marital status:  widowed Review of Systems:   All other systems are negative.  Examination:  Visual Acuity:   Distance VA [place]:  OD: 20/100    OS: CF 77ft IOP:  OD:  21     OS:  17    @ 05:14PM (Goldmann applanation) Manifest Refraction:    Sphere    Cyl Axis       VA         Add       VA Prism Base R:  [place]  -2.00  090    20/50                                  L: NO Ref                   CF                                    Confrontation visual field:   OU:  Normal  Motility:  OU:  Normal  Pupils:  OU:  Shape, size, direct and consensual reaction normal, no APD  Adnexa:  Preauricular LN, lacrimal drainage, lacrimal glands, orbit normal  Eyelids:  Eyelids:  normal Conjunctiva:  OD:  prolapse of lacrimal/fat pad, OU:  bulbar, palpebral normal, pingueculum medial and lateral, melanosis  Cornea:  OU:  epithelium, stroma, endothelium, tear film normal, arcus  Anterior Chamber:  OU:  depth normal, no cell, no flare, 3+ deep / clear  Iris:  OU:  normal Dilation:  OU: Tropicamide 1%/ N 2.5% @ 05:16PM  Lens:  OD:  PC IOL in good position OS:  3+ nuclear sclerotic  cataract, 4+ PSC  Vitreous:  OU:  normal  Optic Disc:  OD:  cupping: 0.9 x 0.85 OS:  No View  Macula:  OD:  normal, no retinopathy OS:  poor view  Vessels:  OD:  normal OS:  poor view  Periphery:  OD:  normal OS:  poor view  A Scan / IOLMaster:  Predicts +21.00 Acrysof [id number] for implant OS for emmetropia   Orientation to person, place and time:  Normal  Mood and affect:  Normal  Impression:  366.19  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 We discussed the related symptoms , visual significance and when we intervene with surgery. We discussed the surgical techniques used, risks and benefits of surgery.  I did not see evidence of Diabetic Retinopathy in the right eye and does not have an afferent defect OS. I do think his CAtaract is sufficient to explain his vision loss OS.  I have recommended proceeding with Phaco IOL OS x 06/10/2013.  He indicated understanding our discussion and felt that his questions had been answered to his satisfaction.  He indicates that he desires to proceed with Cataract Surgery OS.  Patient Instructions: Please do not eat anything after mignight  the day before surgery. Return to clinic:  06/11/2013, 4:45 PM  post-operative follow-up  Schedule:  Phacoemulsification, Posterior Chamber Intraocular Lens OS x 05/29/2013   (electronically signed) Adonis Brook, MD

## 2013-06-01 ENCOUNTER — Encounter (HOSPITAL_COMMUNITY): Payer: Self-pay | Admitting: Pharmacy Technician

## 2013-06-09 ENCOUNTER — Encounter (HOSPITAL_COMMUNITY): Payer: Self-pay | Admitting: *Deleted

## 2013-06-09 MED ORDER — TETRACAINE HCL 0.5 % OP SOLN
2.0000 [drp] | OPHTHALMIC | Status: AC
  Administered 2013-06-10: 2 [drp] via OPHTHALMIC
  Filled 2013-06-09: qty 2

## 2013-06-09 MED ORDER — GATIFLOXACIN 0.5 % OP SOLN
1.0000 [drp] | OPHTHALMIC | Status: AC | PRN
  Administered 2013-06-10 (×3): 1 [drp] via OPHTHALMIC
  Filled 2013-06-09: qty 2.5

## 2013-06-09 MED ORDER — PREDNISOLONE ACETATE 1 % OP SUSP
1.0000 [drp] | OPHTHALMIC | Status: AC
  Administered 2013-06-10: 1 [drp] via OPHTHALMIC
  Filled 2013-06-09: qty 5

## 2013-06-09 MED ORDER — PHENYLEPHRINE HCL 2.5 % OP SOLN
1.0000 [drp] | OPHTHALMIC | Status: AC | PRN
  Administered 2013-06-10 (×3): 1 [drp] via OPHTHALMIC
  Filled 2013-06-09: qty 2

## 2013-06-09 NOTE — Progress Notes (Signed)
Pt's daughter, Kaan Rota, gave and verified pre-op information.

## 2013-06-10 ENCOUNTER — Ambulatory Visit (HOSPITAL_COMMUNITY)
Admission: AD | Admit: 2013-06-10 | Discharge: 2013-06-10 | Disposition: A | Discharge status: Home / Self Care | Payer: PRIVATE HEALTH INSURANCE | Source: Ambulatory Visit | Attending: Ophthalmology | Admitting: Ophthalmology

## 2013-06-10 ENCOUNTER — Encounter (HOSPITAL_COMMUNITY)
Admission: AD | Disposition: A | Discharge status: Home / Self Care | Payer: Self-pay | Source: Ambulatory Visit | Attending: Ophthalmology

## 2013-06-10 ENCOUNTER — Ambulatory Visit (HOSPITAL_COMMUNITY): Payer: PRIVATE HEALTH INSURANCE | Admitting: Anesthesiology

## 2013-06-10 ENCOUNTER — Ambulatory Visit (HOSPITAL_COMMUNITY): Payer: PRIVATE HEALTH INSURANCE

## 2013-06-10 ENCOUNTER — Encounter (HOSPITAL_COMMUNITY): Payer: Self-pay | Admitting: *Deleted

## 2013-06-10 ENCOUNTER — Encounter (HOSPITAL_COMMUNITY): Payer: Self-pay | Admitting: Anesthesiology

## 2013-06-10 DIAGNOSIS — H11139 Conjunctival pigmentations, unspecified eye: Secondary | ICD-10-CM | POA: Insufficient documentation

## 2013-06-10 DIAGNOSIS — IMO0002 Reserved for concepts with insufficient information to code with codable children: Secondary | ICD-10-CM | POA: Insufficient documentation

## 2013-06-10 DIAGNOSIS — H11159 Pinguecula, unspecified eye: Secondary | ICD-10-CM | POA: Insufficient documentation

## 2013-06-10 DIAGNOSIS — H2510 Age-related nuclear cataract, unspecified eye: Secondary | ICD-10-CM | POA: Insufficient documentation

## 2013-06-10 DIAGNOSIS — H25049 Posterior subcapsular polar age-related cataract, unspecified eye: Secondary | ICD-10-CM | POA: Insufficient documentation

## 2013-06-10 DIAGNOSIS — H409 Unspecified glaucoma: Secondary | ICD-10-CM | POA: Insufficient documentation

## 2013-06-10 DIAGNOSIS — E65 Localized adiposity: Secondary | ICD-10-CM | POA: Insufficient documentation

## 2013-06-10 DIAGNOSIS — Z79899 Other long term (current) drug therapy: Secondary | ICD-10-CM | POA: Insufficient documentation

## 2013-06-10 DIAGNOSIS — Z9849 Cataract extraction status, unspecified eye: Secondary | ICD-10-CM | POA: Insufficient documentation

## 2013-06-10 DIAGNOSIS — H4011X0 Primary open-angle glaucoma, stage unspecified: Secondary | ICD-10-CM | POA: Insufficient documentation

## 2013-06-10 DIAGNOSIS — S0530XA Ocular laceration without prolapse or loss of intraocular tissue, unspecified eye, initial encounter: Secondary | ICD-10-CM | POA: Insufficient documentation

## 2013-06-10 DIAGNOSIS — Z87891 Personal history of nicotine dependence: Secondary | ICD-10-CM | POA: Insufficient documentation

## 2013-06-10 DIAGNOSIS — I10 Essential (primary) hypertension: Secondary | ICD-10-CM | POA: Insufficient documentation

## 2013-06-10 DIAGNOSIS — Y9210 Unspecified residential institution as the place of occurrence of the external cause: Secondary | ICD-10-CM | POA: Insufficient documentation

## 2013-06-10 DIAGNOSIS — E119 Type 2 diabetes mellitus without complications: Secondary | ICD-10-CM | POA: Insufficient documentation

## 2013-06-10 DIAGNOSIS — Z85038 Personal history of other malignant neoplasm of large intestine: Secondary | ICD-10-CM | POA: Insufficient documentation

## 2013-06-10 DIAGNOSIS — Z961 Presence of intraocular lens: Secondary | ICD-10-CM | POA: Insufficient documentation

## 2013-06-10 DIAGNOSIS — Z8546 Personal history of malignant neoplasm of prostate: Secondary | ICD-10-CM | POA: Insufficient documentation

## 2013-06-10 HISTORY — PX: CATARACT EXTRACTION W/PHACO: SHX586

## 2013-06-10 LAB — CBC
HCT: 36 % — ABNORMAL LOW (ref 39.0–52.0)
Hemoglobin: 12.8 g/dL — ABNORMAL LOW (ref 13.0–17.0)
MCV: 78.6 fL (ref 78.0–100.0)
RBC: 4.58 MIL/uL (ref 4.22–5.81)
WBC: 6.2 10*3/uL (ref 4.0–10.5)

## 2013-06-10 LAB — BASIC METABOLIC PANEL
BUN: 36 mg/dL — ABNORMAL HIGH (ref 6–23)
CO2: 28 mEq/L (ref 19–32)
Chloride: 102 mEq/L (ref 96–112)
Creatinine, Ser: 1.84 mg/dL — ABNORMAL HIGH (ref 0.50–1.35)
Glucose, Bld: 134 mg/dL — ABNORMAL HIGH (ref 70–99)

## 2013-06-10 LAB — GLUCOSE, CAPILLARY: Glucose-Capillary: 112 mg/dL — ABNORMAL HIGH (ref 70–99)

## 2013-06-10 SURGERY — PHACOEMULSIFICATION, CATARACT, WITH IOL INSERTION
Anesthesia: Monitor Anesthesia Care | Site: Eye | Laterality: Left | Wound class: Clean

## 2013-06-10 MED ORDER — BSS IO SOLN
INTRAOCULAR | Status: AC
  Filled 2013-06-10: qty 500

## 2013-06-10 MED ORDER — ONDANSETRON HCL 4 MG/2ML IJ SOLN
INTRAMUSCULAR | Status: DC | PRN
  Administered 2013-06-10: 4 mg via INTRAVENOUS

## 2013-06-10 MED ORDER — CEFAZOLIN SUBCONJUNCTIVAL INJECTION 100 MG/0.5 ML
INJECTION | SUBCONJUNCTIVAL | Status: DC | PRN
  Administered 2013-06-10: 200 mg via SUBCONJUNCTIVAL

## 2013-06-10 MED ORDER — SODIUM HYALURONATE 10 MG/ML IO SOLN
INTRAOCULAR | Status: AC
  Filled 2013-06-10: qty 0.85

## 2013-06-10 MED ORDER — ACETAZOLAMIDE SODIUM 500 MG IJ SOLR
INTRAMUSCULAR | Status: AC
  Filled 2013-06-10: qty 500

## 2013-06-10 MED ORDER — NA CHONDROIT SULF-NA HYALURON 40-30 MG/ML IO SOLN
INTRAOCULAR | Status: AC
  Filled 2013-06-10: qty 0.5

## 2013-06-10 MED ORDER — DEXAMETHASONE SODIUM PHOSPHATE 10 MG/ML IJ SOLN
INTRAMUSCULAR | Status: AC
  Filled 2013-06-10: qty 1

## 2013-06-10 MED ORDER — BUPIVACAINE HCL (PF) 0.75 % IJ SOLN
INTRAMUSCULAR | Status: AC
  Filled 2013-06-10: qty 10

## 2013-06-10 MED ORDER — ACETYLCHOLINE CHLORIDE 1:100 IO SOLR
INTRAOCULAR | Status: AC
  Filled 2013-06-10: qty 1

## 2013-06-10 MED ORDER — ACETYLCHOLINE CHLORIDE 1:100 IO SOLR
INTRAOCULAR | Status: DC | PRN
  Administered 2013-06-10: 5 mg via INTRAOCULAR

## 2013-06-10 MED ORDER — EPINEPHRINE HCL 1 MG/ML IJ SOLN
INTRAMUSCULAR | Status: AC
  Filled 2013-06-10: qty 1

## 2013-06-10 MED ORDER — CEFAZOLIN SUBCONJUNCTIVAL INJECTION 100 MG/0.5 ML
100.0000 mg | INJECTION | SUBCONJUNCTIVAL | Status: DC
  Filled 2013-06-10: qty 0.5

## 2013-06-10 MED ORDER — NA CHONDROIT SULF-NA HYALURON 40-30 MG/ML IO SOLN
INTRAOCULAR | Status: DC | PRN
  Administered 2013-06-10: 0.5 mL via INTRAOCULAR

## 2013-06-10 MED ORDER — TETRACAINE HCL 0.5 % OP SOLN
OPHTHALMIC | Status: AC
  Filled 2013-06-10: qty 2

## 2013-06-10 MED ORDER — HYPROMELLOSE (GONIOSCOPIC) 2.5 % OP SOLN
OPHTHALMIC | Status: AC
  Filled 2013-06-10: qty 15

## 2013-06-10 MED ORDER — EPINEPHRINE HCL 1 MG/ML IJ SOLN
INTRAOCULAR | Status: DC | PRN
  Administered 2013-06-10: 15:00:00

## 2013-06-10 MED ORDER — BACITRACIN-POLYMYXIN B 500-10000 UNIT/GM OP OINT
TOPICAL_OINTMENT | OPHTHALMIC | Status: AC
  Filled 2013-06-10: qty 3.5

## 2013-06-10 MED ORDER — SODIUM CHLORIDE 0.9 % IV SOLN: INTRAVENOUS | Status: DC

## 2013-06-10 MED ORDER — HYPROMELLOSE (GONIOSCOPIC) 2.5 % OP SOLN
OPHTHALMIC | Status: DC | PRN
  Administered 2013-06-10: 2 [drp] via OPHTHALMIC

## 2013-06-10 MED ORDER — LIDOCAINE HCL 2 % IJ SOLN
INTRAMUSCULAR | Status: AC
  Filled 2013-06-10: qty 20

## 2013-06-10 MED ORDER — LIDOCAINE HCL 2 % IJ SOLN
INTRAMUSCULAR | Status: DC | PRN
  Administered 2013-06-10: 15:00:00 via RETROBULBAR

## 2013-06-10 MED ORDER — BACITRACIN-POLYMYXIN B 500-10000 UNIT/GM OP OINT
TOPICAL_OINTMENT | OPHTHALMIC | Status: DC | PRN
  Administered 2013-06-10: 1 via OPHTHALMIC

## 2013-06-10 MED ORDER — PROPOFOL 10 MG/ML IV BOLUS
INTRAVENOUS | Status: DC | PRN
  Administered 2013-06-10: 20 mg via INTRAVENOUS

## 2013-06-10 MED ORDER — DEXAMETHASONE SODIUM PHOSPHATE 10 MG/ML IJ SOLN
INTRAMUSCULAR | Status: DC | PRN
  Administered 2013-06-10: 10 mg

## 2013-06-10 MED ORDER — BSS IO SOLN
INTRAOCULAR | Status: DC | PRN
  Administered 2013-06-10: 15 mL via INTRAOCULAR

## 2013-06-10 MED ORDER — SODIUM CHLORIDE 0.9 % IV SOLN
INTRAVENOUS | Status: DC | PRN
  Administered 2013-06-10: 15:00:00 via INTRAVENOUS

## 2013-06-10 MED ORDER — SODIUM HYALURONATE 10 MG/ML IO SOLN
INTRAOCULAR | Status: DC | PRN
  Administered 2013-06-10: 0.85 mL via INTRAOCULAR

## 2013-06-10 MED ORDER — TRIAMCINOLONE ACETONIDE 40 MG/ML IJ SUSP
INTRAMUSCULAR | Status: AC
  Filled 2013-06-10: qty 5

## 2013-06-10 SURGICAL SUPPLY — 57 items
APPLICATOR COTTON TIP 6IN STRL (MISCELLANEOUS) ×2 IMPLANT
APPLICATOR DR MATTHEWS STRL (MISCELLANEOUS) ×2 IMPLANT
BAG MINI COLL DRAIN (WOUND CARE) IMPLANT
BLADE EYE MINI 60D BEAVER (BLADE) IMPLANT
BLADE KERATOME 2.75 (BLADE) ×2 IMPLANT
BLADE STAB KNIFE 15DEG (BLADE) IMPLANT
CANNULA ANTERIOR CHAMBER 27GA (MISCELLANEOUS) IMPLANT
CLOTH BEACON ORANGE TIMEOUT ST (SAFETY) ×2 IMPLANT
COVER MAYO STAND STRL (DRAPES) ×2 IMPLANT
DRAPE OPHTHALMIC 77X100 STRL (CUSTOM PROCEDURE TRAY) ×2 IMPLANT
DRAPE POUCH INSTRU U-SHP 10X18 (DRAPES) ×2 IMPLANT
DRSG TEGADERM 4X4.75 (GAUZE/BANDAGES/DRESSINGS) ×2 IMPLANT
FILTER BLUE MILLIPORE (MISCELLANEOUS) IMPLANT
GLOVE BIO SURGEON STRL SZ7.5 (GLOVE) ×2 IMPLANT
GLOVE SS BIOGEL STRL SZ 6.5 (GLOVE) ×1 IMPLANT
GLOVE SUPERSENSE BIOGEL SZ 6.5 (GLOVE) ×1
GLOVE SURG SS PI 6.5 STRL IVOR (GLOVE) ×2 IMPLANT
GOWN SRG XL XLNG 56XLVL 4 (GOWN DISPOSABLE) ×1 IMPLANT
GOWN STRL NON-REIN LRG LVL3 (GOWN DISPOSABLE) ×2 IMPLANT
GOWN STRL NON-REIN XL XLG LVL4 (GOWN DISPOSABLE) ×1
KIT BASIN OR (CUSTOM PROCEDURE TRAY) ×2 IMPLANT
KIT ROOM TURNOVER OR (KITS) IMPLANT
KNIFE GRIESHABER SHARP 2.5MM (MISCELLANEOUS) ×2 IMPLANT
LENS IOL ACRYSOF MP POST 21.0 (Intraocular Lens) ×2 IMPLANT
MASK EYE SHIELD (GAUZE/BANDAGES/DRESSINGS) ×2 IMPLANT
NEEDLE 18GX1X1/2 (RX/OR ONLY) (NEEDLE) IMPLANT
NEEDLE 22X1 1/2 (OR ONLY) (NEEDLE) ×2 IMPLANT
NEEDLE 25GX 5/8IN NON SAFETY (NEEDLE) ×2 IMPLANT
NEEDLE FILTER BLUNT 18X 1/2SAF (NEEDLE) ×1
NEEDLE FILTER BLUNT 18X1 1/2 (NEEDLE) ×1 IMPLANT
NEEDLE HYPO 30X.5 LL (NEEDLE) ×4 IMPLANT
NS IRRIG 1000ML POUR BTL (IV SOLUTION) ×2 IMPLANT
PACK CATARACT CUSTOM (CUSTOM PROCEDURE TRAY) ×2 IMPLANT
PAD ARMBOARD 7.5X6 YLW CONV (MISCELLANEOUS) ×4 IMPLANT
PAD EYE OVAL STERILE LF (GAUZE/BANDAGES/DRESSINGS) ×2 IMPLANT
PROBE ANTERIOR 20G W/INFUS NDL (MISCELLANEOUS) IMPLANT
ROLLS DENTAL (MISCELLANEOUS) IMPLANT
SHUTTLE MONARCH TYPE A (NEEDLE) ×2 IMPLANT
SPEAR EYE SURG WECK-CEL (MISCELLANEOUS) IMPLANT
SUT ETHILON 10-0 CS-B-6CS-B-6 (SUTURE)
SUT ETHILON 5 0 P 3 18 (SUTURE)
SUT ETHILON 9 0 TG140 8 (SUTURE) IMPLANT
SUT NYLON ETHILON 5-0 P-3 1X18 (SUTURE) IMPLANT
SUT PLAIN 6 0 TG1408 (SUTURE) IMPLANT
SUT POLY NON ABSORB 10-0 8 STR (SUTURE) IMPLANT
SUT VICRYL 6 0 S 29 12 (SUTURE) IMPLANT
SUTURE EHLN 10-0 CS-B-6CS-B-6 (SUTURE) IMPLANT
SYR 20CC LL (SYRINGE) IMPLANT
SYR 5ML LL (SYRINGE) IMPLANT
SYR TB 1ML LUER SLIP (SYRINGE) ×2 IMPLANT
SYRINGE 10CC LL (SYRINGE) IMPLANT
TAPE PAPER MEDFIX 1IN X 10YD (GAUZE/BANDAGES/DRESSINGS) ×2 IMPLANT
TIP ABS 45DEG FLARED 0.9MM (TIP) ×2 IMPLANT
TOWEL OR 17X24 6PK STRL BLUE (TOWEL DISPOSABLE) ×4 IMPLANT
WATER STERILE IRR 1000ML POUR (IV SOLUTION) ×2 IMPLANT
WIPE INSTRUMENT ADHESIVE BACK (MISCELLANEOUS) ×2 IMPLANT
WIPE INSTRUMENT VISIWIPE 73X73 (MISCELLANEOUS) ×2 IMPLANT

## 2013-06-10 NOTE — Interval H&P Note (Signed)
History and Physical Interval Note:  06/10/2013 12:39 PM  [name]  has presented today for surgery, with the diagnosis of Combined Cataract left Eye  The various methods of treatment have been discussed with the patient and family. After consideration of risks, benefits and other options for treatment, the patient has consented to  Procedure(s): CATARACT EXTRACTION PHACO AND INTRAOCULAR LENS PLACEMENT ([place]) (Left) as a surgical intervention .  The patient's history has been reviewed, patient examined, no change in status, stable for surgery.  I have reviewed the patient's chart and labs.  Questions were answered to the patient's satisfaction.     [name], [name]

## 2013-06-10 NOTE — Anesthesia Postprocedure Evaluation (Signed)
  Anesthesia Post-op Note  Patient: Armed forces technical officer  Procedure(s) Performed: Procedure(s): CATARACT EXTRACTION PHACO AND INTRAOCULAR LENS PLACEMENT ([facility]) (Left)  Patient Location: PACU  Anesthesia Type:MAC  Level of Consciousness: awake and alert   Airway and Oxygen Therapy: Patient Spontanous Breathing  Post-op Pain: none  Post-op Assessment: Post-op Vital signs reviewed, Patient's Cardiovascular Status Stable, Respiratory Function Stable and Patent Airway  Post-op Vital Signs: Reviewed and stable  Complications: No apparent anesthesia complications

## 2013-06-10 NOTE — Anesthesia Preprocedure Evaluation (Signed)
Anesthesia Evaluation  Patient identified by MRN, date of birth, ID band Patient awake    Reviewed: Allergy & Precautions, H&P , NPO status , Patient's Chart, lab work & pertinent test results  Airway Mallampati: II TM Distance: >3 FB Neck ROM: Full    Dental no notable dental hx. (+) Poor Dentition and Dental Advisory Given   Pulmonary neg pulmonary ROS,  breath sounds clear to auscultation  Pulmonary exam normal       Cardiovascular hypertension, On Medications negative cardio ROS  Rhythm:Regular Rate:Normal     Neuro/Psych negative neurological ROS  negative psych ROS   GI/Hepatic negative GI ROS, Neg liver ROS,   Endo/Other  diabetes, Type 2, Oral Hypoglycemic AgentsMorbid obesity  Renal/GU negative Renal ROS  negative genitourinary   Musculoskeletal   Abdominal   Peds  Hematology negative hematology ROS (+)   Anesthesia Other Findings   Reproductive/Obstetrics negative OB ROS                           Anesthesia Physical Anesthesia Plan  ASA: III  Anesthesia Plan: MAC   Post-op Pain Management:    Induction: Intravenous  Airway Management Planned: Simple Face Mask and Nasal Cannula  Additional Equipment:   Intra-op Plan:   Post-operative Plan:   Informed Consent: I have reviewed the patients History and Physical, chart, labs and discussed the procedure including the risks, benefits and alternatives for the proposed anesthesia with the patient or authorized representative who has indicated his/her understanding and acceptance.   Dental advisory given  Plan Discussed with: CRNA  Anesthesia Plan Comments:         Anesthesia Quick Evaluation

## 2013-06-10 NOTE — Op Note (Signed)
[name] 06/10/2013 Cataract: Combined, Nuclear  Procedure: Phacoemulsification, Posterior Chamber Intra-ocular Lens Operative Eye:  left eye  Surgeon: Adonis Brook Estimated Blood Loss: minimal Specimens for Pathology:  None Complications: radial capsule tear  The patient was prepared and draped in the usual manner for ocular surgery on the left eye. A Cook lid speculum was placed. A peripheral clear corneal incision was made at the surgical limbus centered at the 11:00 meridian. A separate clear corneal stab incision was made with a 15 degree blade at the 2:00 meridian to permit bi-manual technique. Viscoat and  Provisc as an underlying layer next to the capsule was instilled into the anterior chamber through that incision.  A keratome was used to create a self sealing incision entering the anterior chamber at the 11:00 meridian. A capsulorhexis was performed using a bent 25g needle. A temporal radial capsule radial tear developed.  The lens was hydrodissected and the nucleus was hydrodilineated using a Nichammin cannula. The Chang chopper was inserted and used to rotate the lens to insure adequate lens mobility. The phacoemulsification handpiece was inserted and a combined phaco-chop technique was employed, fracturing the lens into separate sections with subsequent removal with the phaco handpiece.   The I/A cannula was used to remove remaining lens cortex. Provisc was instilled and used to deepen the anterior chamber and posterior capsule bag. The Monarch injector was used to place a folded Acrysof MA50BM PC IOL, + 21.00  diopters, into the capsule bag. A Sinskey lens hook was used to dial in the trailing haptic.  The I/A cannula was used to remove the viscoelastic from the anterior chamber. BSS was used to bring IOP to the desired range and the wound was checked to insure it was watertight. Subconjunctival injections of Ancef 100/0.44ml and Dexamethasone 0.5 ml of a 10mg /6ml solution were  placed without complication. The lid speculum and drapes were removed and the patient's eye was patched with Polymixin/Bacitracin ophthalmic ointment. An eye shield was placed and the patient was transferred alert and conversant from the operating room to the post-operative recovery area.   Adonis Brook, MD

## 2013-06-10 NOTE — Transfer of Care (Signed)
Immediate Anesthesia Transfer of Care Note  Patient: [name]  Procedure(s) Performed: Procedure(s): CATARACT EXTRACTION PHACO AND INTRAOCULAR LENS PLACEMENT ([facility]) (Left)  Patient Location: PACU  Anesthesia Type:MAC  Level of Consciousness: awake, alert , oriented and sedated  Airway & Oxygen Therapy: Patient Spontanous Breathing  Post-op Assessment: Report given to PACU RN, Post -op Vital signs reviewed and stable and Patient moving all extremities  Post vital signs: Reviewed and stable  Complications: No apparent anesthesia complications

## 2013-06-10 NOTE — H&P (View-Only) (Signed)
Patient Record  [name], [name]  Patient Number:  [id number] Date of Birth:  [birth year] Age:  79 years old    Gender:  male Date of Evaluation:  May 28, 2013  Chief Complaint:   79 year old male refered by Dr. Criss Rosales, c/o all of a noticed not being able to see out left eye. History of Present Illness:   79 yo male c/o sudden loss of vision os x 5 days ago.  No p ain or discomfort.  N o spots or flashes.  Diabetic.  CHO controlled. Past History:  Allergies:  nkda, Active Medications:   Other Medications:  Amitiza (lubiprostone) by Ara Kussmaul, capsule 24 mcg, amlodipine (amlodipine) by Ara Kussmaul, tablet 10 mg, pioglitazone (pioglitazone) by Ara Kussmaul, tablet 15 mg, meloxicam (meloxicam) by Ara Kussmaul, tablet 7.5 mg, chlorthalidone (chlorthalidone) by Ara Kussmaul, tablet 25 mg, atorvastatin (atorvastatin) by Ara Kussmaul, tablet 40 mg Birth History:  none Past Ocular History:   cataract removed od Past Medical History:   Hypertension diabetes colon cancer prostate cancer Past Surgical History:   cancer Family History:  no amblyopia, no blindness, no cataracts, no crossed eyes, no diabetic retinopathy, no glaucoma, no macular degeneration, no retinal detachment, + cancer (nephew), + diabetes (nephew, niece), + heart disease (brother), + high blood pressure (brother), + stroke (brother, nephew) Social History:   Smoking Status: former smoker  Alcohol:  none   Driving status:  driving Marital status:  widowed Review of Systems:   All other systems are negative.  Examination:  Visual Acuity:   Distance VA :  OD: 20/100    OS: CF 77ft IOP:  OD:  21     OS:  17    @ 05:14PM (Goldmann applanation) Manifest Refraction:    Sphere    Cyl Axis       VA         Add       VA Prism Base R:  [place]  -2.00  090    20/50                                  L: NO Ref                   CF                                    Confrontation visual field:   OU:  Normal  Motility:  OU:  Normal  Pupils:  OU:  Shape, size, direct and consensual reaction normal, no APD  Adnexa:  Preauricular LN, lacrimal drainage, lacrimal glands, orbit normal  Eyelids:  Eyelids:  normal Conjunctiva:  OD:  prolapse of lacrimal/fat pad, OU:  bulbar, palpebral normal, pingueculum medial and lateral, melanosis  Cornea:  OU:  epithelium, stroma, endothelium, tear film normal, arcus  Anterior Chamber:  OU:  depth normal, no cell, no flare, 3+ deep / clear  Iris:  OU:  normal Dilation:  OU: Tropicamide 1%/ N 2.5% @ 05:16PM  Lens:  OD:  PC IOL in good position OS:  3+ nuclear sclerotic  cataract, 4+ PSC  Vitreous:  OU:  normal  Optic Disc:  OD:  cupping: 0.9 x 0.85 OS:  No View  Macula:  OD:  normal, no retinopathy OS:  poor view  Vessels:  OD:  normal OS:  poor view  Periphery:  OD:  normal OS:  poor view  A Scan / IOLMaster:  Predicts +21.00 Acrysof MA50BM for implant OS for emmetropia   Orientation to person, place and time:  Normal  Mood and affect:  Normal  Impression:  366.19  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 We discussed the related symptoms , visual significance and when we intervene with surgery. We discussed the surgical techniques used, risks and benefits of surgery.  I did not see evidence of Diabetic Retinopathy in the right eye and does not have an afferent defect OS. I do think his CAtaract is sufficient to explain his vision loss OS.  I have recommended proceeding with Phaco IOL OS x 06/10/2013.  He indicated understanding our discussion and felt that his questions had been answered to his satisfaction.  He indicates that he desires to proceed with Cataract Surgery OS.  Patient Instructions: Please do not eat anything after mignight  the day before surgery. Return to clinic:  06/11/2013, 4:45 PM  post-operative follow-up  Schedule:  Phacoemulsification, Posterior Chamber Intraocular Lens OS x 05/29/2013   (electronically signed) Adonis Brook, MD

## 2013-06-10 NOTE — Preoperative (Signed)
Beta Blockers   Reason not to administer Beta Blockers:Not Applicable 

## 2013-06-12 ENCOUNTER — Encounter (HOSPITAL_COMMUNITY): Payer: Self-pay | Admitting: Ophthalmology

## 2013-10-27 ENCOUNTER — Other Ambulatory Visit: Payer: Self-pay | Admitting: Oncology

## 2013-10-27 DIAGNOSIS — Z85038 Personal history of other malignant neoplasm of large intestine: Secondary | ICD-10-CM

## 2013-10-28 ENCOUNTER — Ambulatory Visit (HOSPITAL_BASED_OUTPATIENT_CLINIC_OR_DEPARTMENT_OTHER): Payer: PRIVATE HEALTH INSURANCE | Admitting: Oncology

## 2013-10-28 ENCOUNTER — Other Ambulatory Visit (HOSPITAL_BASED_OUTPATIENT_CLINIC_OR_DEPARTMENT_OTHER): Payer: PRIVATE HEALTH INSURANCE

## 2013-10-28 VITALS — BP 170/66 | HR 76 | Temp 97.2°F | Resp 18 | Ht 66.0 in | Wt 221.9 lb

## 2013-10-28 DIAGNOSIS — Z8546 Personal history of malignant neoplasm of prostate: Secondary | ICD-10-CM

## 2013-10-28 DIAGNOSIS — I10 Essential (primary) hypertension: Secondary | ICD-10-CM

## 2013-10-28 DIAGNOSIS — Z85038 Personal history of other malignant neoplasm of large intestine: Secondary | ICD-10-CM

## 2013-10-28 DIAGNOSIS — E119 Type 2 diabetes mellitus without complications: Secondary | ICD-10-CM

## 2013-10-28 LAB — CBC WITH DIFFERENTIAL/PLATELET
BASO%: 0.6 % (ref 0.0–2.0)
Basophils Absolute: 0 10*3/uL (ref 0.0–0.1)
EOS%: 2.3 % (ref 0.0–7.0)
Eosinophils Absolute: 0.2 10*3/uL (ref 0.0–0.5)
HEMATOCRIT: 37.9 % — AB (ref 38.4–49.9)
HGB: 12.7 g/dL — ABNORMAL LOW (ref 13.0–17.1)
LYMPH#: 2.3 10*3/uL (ref 0.9–3.3)
LYMPH%: 35.1 % (ref 14.0–49.0)
MCH: 27.2 pg (ref 27.2–33.4)
MCHC: 33.4 g/dL (ref 32.0–36.0)
MCV: 81.3 fL (ref 79.3–98.0)
MONO#: 0.5 10*3/uL (ref 0.1–0.9)
MONO%: 7.4 % (ref 0.0–14.0)
NEUT#: 3.6 10*3/uL (ref 1.5–6.5)
NEUT%: 54.6 % (ref 39.0–75.0)
Platelets: 242 10*3/uL (ref 140–400)
RBC: 4.67 10*6/uL (ref 4.20–5.82)
RDW: 15.9 % — AB (ref 11.0–14.6)
WBC: 6.6 10*3/uL (ref 4.0–10.3)

## 2013-10-28 LAB — COMPREHENSIVE METABOLIC PANEL (CC13)
ALBUMIN: 3.5 g/dL (ref 3.5–5.0)
ALT: 9 U/L (ref 0–55)
ANION GAP: 12 meq/L — AB (ref 3–11)
AST: 14 U/L (ref 5–34)
Alkaline Phosphatase: 69 U/L (ref 40–150)
BUN: 44.9 mg/dL — ABNORMAL HIGH (ref 7.0–26.0)
CALCIUM: 10.2 mg/dL (ref 8.4–10.4)
CHLORIDE: 103 meq/L (ref 98–109)
CO2: 29 meq/L (ref 22–29)
CREATININE: 1.9 mg/dL — AB (ref 0.7–1.3)
Glucose: 134 mg/dl (ref 70–140)
POTASSIUM: 3.6 meq/L (ref 3.5–5.1)
SODIUM: 144 meq/L (ref 136–145)
TOTAL PROTEIN: 8.1 g/dL (ref 6.4–8.3)
Total Bilirubin: 0.73 mg/dL (ref 0.20–1.20)

## 2013-10-28 NOTE — Progress Notes (Signed)
Hematology and Oncology Follow Up Visit  [name] [id number] 05/17/1924 80 y.o. 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 0/13 lymph nodes positive.  Prior Therapy:  S/P laparoscopic right hemicolectomy on 09/21/07.  Current therapy: Observation  Interim History:  [name] returns for routine follow-up with his granddaughter. It has been 1 year since his last visit in our office. He has been doing well overall. Appetite is good and weight is up by 6 lbs since his last visit. Denies abdominal pain, nausea, vomiting. No change in his bowel habits. He has not noticed any hematuria or melena. He has not reported any recent hospitalization or illnesses. Has not reported any hematochezia or melena. Performance status although limited has not changed dramatically.  Medications: I have reviewed the patient's current medications. Current Outpatient Prescriptions  Medication Sig Dispense Refill  . amLODipine (NORVASC) 10 MG tablet Take 10 mg by mouth daily.      Marland Kitchen atorvastatin (LIPITOR) 40 MG tablet Take 40 mg by mouth daily.      . bifidobacterium infantis (ALIGN) capsule Take 1 capsule by mouth daily.      . chlorthalidone (HYGROTON) 25 MG tablet Take 25 mg by mouth daily.      . pioglitazone (ACTOS) 15 MG tablet Take 15 mg by mouth daily.      . potassium chloride SA (K-DUR,KLOR-CON) 20 MEQ tablet Take 20 mEq by mouth daily.       No current facility-administered medications for this visit.    Allergies: No Known Allergies  Past Medical History, Surgical history, Social history, and Family History were reviewed and updated.  Review of Systems: Constitutional:  Negative for fever, chills, night sweats, anorexia, weight loss, pain.  Remaining ROS negative.  Physical Exam: Blood pressure 170/66, pulse 76, temperature 97.2 F (36.2 C),  temperature source Oral, resp. rate 18, height 5\' 6"  (1.676 m), weight 221 lb 14.4 oz (100.653 kg), SpO2 100.00%. ECOG: 1 General appearance: alert, cooperative and no distress Head: Normocephalic, without obvious abnormality, atraumatic Neck: no adenopathy, no carotid bruit, no JVD, supple, symmetrical, trachea midline and thyroid not enlarged, symmetric, no tenderness/mass/nodules Lymph nodes: Cervical, supraclavicular, and axillary nodes normal. Heart:regular rate and rhythm, S1, S2 normal, no murmur, click, rub or gallop Lung:chest clear, no wheezing, rales, normal symmetric air entry, no tachypnea, retractions or cyanosis Abdomen: soft, non-tender, without masses or organomegaly EXT:no erythema, induration, or nodules   Lab Results: Lab Results  Component Value Date   WBC 6.6 10/28/2013   HGB 12.7* 10/28/2013   HCT 37.9* 10/28/2013   MCV 81.3 10/28/2013   PLT 242 10/28/2013     Chemistry      Component Value Date/Time   NA 142 06/10/2013 1130   NA 143 10/24/2012 1017   K 3.0* 06/10/2013 1130   K 3.6 10/24/2012 1017   CL 102 06/10/2013 1130   CL 103 10/24/2012 1017   CO2 28 06/10/2013 1130   CO2 28 10/24/2012 1017   BUN 36* 06/10/2013 1130   BUN 43.0* 10/24/2012 1017   CREATININE 1.84* 06/10/2013 1130   CREATININE 1.7* 10/24/2012 1017      Component Value Date/Time   CALCIUM 10.0 06/10/2013 1130   CALCIUM 9.9 10/24/2012 1017   ALKPHOS 76 10/24/2012 1017   ALKPHOS 55 08/14/2011 1319   AST 19 10/24/2012 1017   AST 18  08/14/2011 1319   ALT 12 10/24/2012 1017   ALT 10 08/14/2011 1319   BILITOT 0.47 10/24/2012 1017   BILITOT 0.6 08/14/2011 1319      Impression and Plan:  This is an 80 year old gentleman with the following issues:  1. Stage II, T3 N0, M0 colon cancer. S/P right hemicolectomy in 2008. He has no evidence of recurrence based on history, exam, or labs today. From a cancer standpoint, I see no further surveillance is needed as he is over 6 years out from his cancer operation. We'll be happy  to see him in the future as needed.  2. History of prostate cancer. Diagnosed in 1993. PSA is followed by PCP. No symptoms of disease at this time.  3. HTN. On Amlodipine per PCP.  4. DM. On Actos per PCP.  5. Hyperlipidemia. On Lipitor per PCP.     [id number] 1/7/201511:37 AM

## 2013-10-29 LAB — CEA: CEA: 2.1 ng/mL (ref 0.0–5.0)

## 2013-10-30 ENCOUNTER — Encounter: Payer: Self-pay | Admitting: Oncology

## 2014-02-02 ENCOUNTER — Other Ambulatory Visit (HOSPITAL_COMMUNITY): Payer: Self-pay | Admitting: Urology

## 2014-02-02 DIAGNOSIS — C61 Malignant neoplasm of prostate: Secondary | ICD-10-CM

## 2014-02-18 ENCOUNTER — Encounter (HOSPITAL_COMMUNITY): Payer: Self-pay

## 2014-02-18 ENCOUNTER — Ambulatory Visit (HOSPITAL_COMMUNITY)
Admission: RE | Admit: 2014-02-18 | Discharge: 2014-02-18 | Disposition: A | Discharge status: Home / Self Care | Payer: PRIVATE HEALTH INSURANCE | Source: Ambulatory Visit | Attending: Urology | Admitting: Urology

## 2014-02-18 ENCOUNTER — Encounter (HOSPITAL_COMMUNITY)
Admission: RE | Admit: 2014-02-18 | Discharge: 2014-02-18 | Disposition: A | Discharge status: Home / Self Care | Payer: PRIVATE HEALTH INSURANCE | Source: Ambulatory Visit | Attending: Urology | Admitting: Urology

## 2014-02-18 DIAGNOSIS — C61 Malignant neoplasm of prostate: Secondary | ICD-10-CM | POA: Insufficient documentation

## 2014-02-18 MED ORDER — TECHNETIUM TC 99M MEDRONATE IV KIT
27.0000 | PACK | Freq: Once | INTRAVENOUS | Status: AC | PRN
  Administered 2014-02-18: 27 via INTRAVENOUS

## 2014-05-20 ENCOUNTER — Encounter (HOSPITAL_COMMUNITY): Payer: Self-pay | Admitting: Emergency Medicine

## 2014-05-20 ENCOUNTER — Emergency Department (HOSPITAL_COMMUNITY): Payer: PRIVATE HEALTH INSURANCE

## 2014-05-20 ENCOUNTER — Observation Stay (HOSPITAL_COMMUNITY): Payer: PRIVATE HEALTH INSURANCE

## 2014-05-20 ENCOUNTER — Observation Stay (HOSPITAL_COMMUNITY)
Admission: EM | Admit: 2014-05-20 | Discharge: 2014-05-22 | Disposition: A | Discharge status: Home / Self Care | Payer: PRIVATE HEALTH INSURANCE | Attending: Internal Medicine | Admitting: Internal Medicine

## 2014-05-20 DIAGNOSIS — Z9049 Acquired absence of other specified parts of digestive tract: Secondary | ICD-10-CM | POA: Diagnosis not present

## 2014-05-20 DIAGNOSIS — Z8546 Personal history of malignant neoplasm of prostate: Secondary | ICD-10-CM | POA: Insufficient documentation

## 2014-05-20 DIAGNOSIS — C61 Malignant neoplasm of prostate: Secondary | ICD-10-CM

## 2014-05-20 DIAGNOSIS — E876 Hypokalemia: Secondary | ICD-10-CM

## 2014-05-20 DIAGNOSIS — R799 Abnormal finding of blood chemistry, unspecified: Secondary | ICD-10-CM | POA: Diagnosis not present

## 2014-05-20 DIAGNOSIS — Z87891 Personal history of nicotine dependence: Secondary | ICD-10-CM | POA: Diagnosis not present

## 2014-05-20 DIAGNOSIS — G9389 Other specified disorders of brain: Secondary | ICD-10-CM | POA: Insufficient documentation

## 2014-05-20 DIAGNOSIS — R42 Dizziness and giddiness: Secondary | ICD-10-CM | POA: Diagnosis not present

## 2014-05-20 DIAGNOSIS — Z85038 Personal history of other malignant neoplasm of large intestine: Secondary | ICD-10-CM | POA: Diagnosis not present

## 2014-05-20 DIAGNOSIS — N184 Chronic kidney disease, stage 4 (severe): Secondary | ICD-10-CM | POA: Diagnosis not present

## 2014-05-20 DIAGNOSIS — R0602 Shortness of breath: Secondary | ICD-10-CM

## 2014-05-20 DIAGNOSIS — Z79899 Other long term (current) drug therapy: Secondary | ICD-10-CM | POA: Diagnosis not present

## 2014-05-20 DIAGNOSIS — I10 Essential (primary) hypertension: Secondary | ICD-10-CM

## 2014-05-20 DIAGNOSIS — I129 Hypertensive chronic kidney disease with stage 1 through stage 4 chronic kidney disease, or unspecified chronic kidney disease: Secondary | ICD-10-CM | POA: Diagnosis not present

## 2014-05-20 DIAGNOSIS — E119 Type 2 diabetes mellitus without complications: Secondary | ICD-10-CM

## 2014-05-20 DIAGNOSIS — E785 Hyperlipidemia, unspecified: Secondary | ICD-10-CM

## 2014-05-20 LAB — D-DIMER, QUANTITATIVE: D-Dimer, Quant: 0.69 ug/mL-FEU — ABNORMAL HIGH (ref 0.00–0.48)

## 2014-05-20 LAB — I-STAT TROPONIN, ED: Troponin i, poc: 0.01 ng/mL (ref 0.00–0.08)

## 2014-05-20 LAB — COMPREHENSIVE METABOLIC PANEL
ALK PHOS: 71 U/L (ref 39–117)
ALT: 12 U/L (ref 0–53)
ANION GAP: 19 — AB (ref 5–15)
AST: 21 U/L (ref 0–37)
Albumin: 3.7 g/dL (ref 3.5–5.2)
BUN: 45 mg/dL — AB (ref 6–23)
CALCIUM: 10.2 mg/dL (ref 8.4–10.5)
CO2: 24 meq/L (ref 19–32)
Chloride: 95 mEq/L — ABNORMAL LOW (ref 96–112)
Creatinine, Ser: 1.81 mg/dL — ABNORMAL HIGH (ref 0.50–1.35)
GFR, EST AFRICAN AMERICAN: 37 mL/min — AB (ref >90)
GFR, EST NON AFRICAN AMERICAN: 32 mL/min — AB (ref >90)
GLUCOSE: 152 mg/dL — AB (ref 70–99)
Potassium: 2.8 mEq/L — CL (ref 3.7–5.3)
SODIUM: 138 meq/L (ref 137–147)
TOTAL PROTEIN: 8.1 g/dL (ref 6.0–8.3)
Total Bilirubin: 0.6 mg/dL (ref 0.3–1.2)

## 2014-05-20 LAB — CBC
HEMATOCRIT: 37 % — AB (ref 39.0–52.0)
HEMOGLOBIN: 12.7 g/dL — AB (ref 13.0–17.0)
MCH: 26.9 pg (ref 26.0–34.0)
MCHC: 34.3 g/dL (ref 30.0–36.0)
MCV: 78.4 fL (ref 78.0–100.0)
Platelets: 218 10*3/uL (ref 150–400)
RBC: 4.72 MIL/uL (ref 4.22–5.81)
RDW: 15.6 % — ABNORMAL HIGH (ref 11.5–15.5)
WBC: 6.3 10*3/uL (ref 4.0–10.5)

## 2014-05-20 LAB — GLUCOSE, CAPILLARY
Glucose-Capillary: 105 mg/dL — ABNORMAL HIGH (ref 70–99)
Glucose-Capillary: 164 mg/dL — ABNORMAL HIGH (ref 70–99)

## 2014-05-20 LAB — PRO B NATRIURETIC PEPTIDE: Pro B Natriuretic peptide (BNP): 183.3 pg/mL (ref 0–450)

## 2014-05-20 LAB — MAGNESIUM: Magnesium: 2.2 mg/dL (ref 1.5–2.5)

## 2014-05-20 LAB — CBG MONITORING, ED: Glucose-Capillary: 121 mg/dL — ABNORMAL HIGH (ref 70–99)

## 2014-05-20 MED ORDER — MECLIZINE HCL 25 MG PO TABS
25.0000 mg | ORAL_TABLET | Freq: Once | ORAL | Status: AC
  Administered 2014-05-20: 25 mg via ORAL
  Filled 2014-05-20: qty 1

## 2014-05-20 MED ORDER — SODIUM CHLORIDE 0.9 % IV BOLUS (SEPSIS)
500.0000 mL | Freq: Once | INTRAVENOUS | Status: AC
Start: 2014-05-20 — End: 2014-05-20
  Administered 2014-05-20: 500 mL via INTRAVENOUS

## 2014-05-20 MED ORDER — ONDANSETRON HCL 4 MG/2ML IJ SOLN: 4.0000 mg | Freq: Four times a day (QID) | INTRAMUSCULAR | Status: DC | PRN

## 2014-05-20 MED ORDER — ASPIRIN 325 MG PO TABS
325.0000 mg | ORAL_TABLET | Freq: Every day | ORAL | Status: DC
  Administered 2014-05-20 – 2014-05-22 (×3): 325 mg via ORAL
  Filled 2014-05-20 (×3): qty 1

## 2014-05-20 MED ORDER — AMLODIPINE BESYLATE 10 MG PO TABS
10.0000 mg | ORAL_TABLET | Freq: Every day | ORAL | Status: DC
  Administered 2014-05-20 – 2014-05-22 (×3): 10 mg via ORAL
  Filled 2014-05-20 (×3): qty 1

## 2014-05-20 MED ORDER — PIOGLITAZONE HCL 15 MG PO TABS
15.0000 mg | ORAL_TABLET | Freq: Every day | ORAL | Status: DC
  Administered 2014-05-21 – 2014-05-22 (×2): 15 mg via ORAL
  Filled 2014-05-20 (×2): qty 1

## 2014-05-20 MED ORDER — MECLIZINE HCL 25 MG PO TABS
25.0000 mg | ORAL_TABLET | Freq: Three times a day (TID) | ORAL | Status: DC
  Administered 2014-05-20 – 2014-05-22 (×5): 25 mg via ORAL
  Filled 2014-05-20 (×7): qty 1

## 2014-05-20 MED ORDER — ACETAMINOPHEN 650 MG RE SUPP: 650.0000 mg | Freq: Four times a day (QID) | RECTAL | Status: DC | PRN

## 2014-05-20 MED ORDER — CETYLPYRIDINIUM CHLORIDE 0.05 % MT LIQD
7.0000 mL | Freq: Two times a day (BID) | OROMUCOSAL | Status: DC
  Administered 2014-05-21 – 2014-05-22 (×2): 7 mL via OROMUCOSAL

## 2014-05-20 MED ORDER — LORAZEPAM 2 MG/ML IJ SOLN
0.5000 mg | Freq: Four times a day (QID) | INTRAMUSCULAR | Status: DC | PRN
  Filled 2014-05-20: qty 1

## 2014-05-20 MED ORDER — ENOXAPARIN SODIUM 30 MG/0.3ML ~~LOC~~ SOLN
30.0000 mg | SUBCUTANEOUS | Status: DC
  Administered 2014-05-20: 30 mg via SUBCUTANEOUS
  Filled 2014-05-20 (×2): qty 0.3

## 2014-05-20 MED ORDER — POTASSIUM CHLORIDE CRYS ER 20 MEQ PO TBCR
40.0000 meq | EXTENDED_RELEASE_TABLET | Freq: Three times a day (TID) | ORAL | Status: AC
  Administered 2014-05-20 – 2014-05-21 (×4): 40 meq via ORAL
  Filled 2014-05-20 (×5): qty 2

## 2014-05-20 MED ORDER — LEVALBUTEROL HCL 0.63 MG/3ML IN NEBU: 0.6300 mg | INHALATION_SOLUTION | Freq: Four times a day (QID) | RESPIRATORY_TRACT | Status: DC | PRN

## 2014-05-20 MED ORDER — SODIUM CHLORIDE 0.9 % IJ SOLN
3.0000 mL | Freq: Two times a day (BID) | INTRAMUSCULAR | Status: DC
  Administered 2014-05-20 – 2014-05-22 (×4): 3 mL via INTRAVENOUS

## 2014-05-20 MED ORDER — TECHNETIUM TC 99M DIETHYLENETRIAME-PENTAACETIC ACID: 40.0000 | Freq: Once | INTRAVENOUS | Status: AC | PRN

## 2014-05-20 MED ORDER — INSULIN ASPART 100 UNIT/ML ~~LOC~~ SOLN: 0.0000 [IU] | Freq: Three times a day (TID) | SUBCUTANEOUS | Status: DC

## 2014-05-20 MED ORDER — ACETAMINOPHEN 325 MG PO TABS: 650.0000 mg | ORAL_TABLET | Freq: Four times a day (QID) | ORAL | Status: DC | PRN

## 2014-05-20 MED ORDER — ATORVASTATIN CALCIUM 40 MG PO TABS
40.0000 mg | ORAL_TABLET | Freq: Every day | ORAL | Status: DC
  Administered 2014-05-20 – 2014-05-21 (×2): 40 mg via ORAL
  Filled 2014-05-20 (×3): qty 1

## 2014-05-20 MED ORDER — SODIUM CHLORIDE 0.9 % IV SOLN
INTRAVENOUS | Status: AC
  Administered 2014-05-20: 23:00:00 via INTRAVENOUS

## 2014-05-20 MED ORDER — CHLORTHALIDONE 25 MG PO TABS
25.0000 mg | ORAL_TABLET | Freq: Every day | ORAL | Status: DC
  Administered 2014-05-20 – 2014-05-22 (×3): 25 mg via ORAL
  Filled 2014-05-20 (×3): qty 1

## 2014-05-20 MED ORDER — TECHNETIUM TO 99M ALBUMIN AGGREGATED
5.0000 | Freq: Once | INTRAVENOUS | Status: AC | PRN
  Administered 2014-05-20: 5 via INTRAVENOUS

## 2014-05-20 MED ORDER — DIAZEPAM 5 MG PO TABS
5.0000 mg | ORAL_TABLET | Freq: Once | ORAL | Status: AC
  Administered 2014-05-20: 5 mg via ORAL
  Filled 2014-05-20: qty 1

## 2014-05-20 MED ORDER — ONDANSETRON HCL 4 MG PO TABS: 4.0000 mg | ORAL_TABLET | Freq: Four times a day (QID) | ORAL | Status: DC | PRN

## 2014-05-20 NOTE — ED Notes (Signed)
Patient transported to CT 

## 2014-05-20 NOTE — ED Notes (Addendum)
Pt c/o dizziness and SOB, denies chest pain. Pt is having slurred speech. Pt eyes were going side to side rapidly.

## 2014-05-20 NOTE — H&P (Addendum)
[facility] History and Physical  [name] [id number] DOB: Nov 23, 1923 DOA: 05/20/2014  Referring physician:  PCP: Elyn Peers, MD   Chief Complaint: Vertigo  HPI:  80 year old male with a history of prostate cancer colon cancer in remission, comes in with vertigo that started acutely last night. The patient had associated symptoms of blurry vision and diplopia. No nausea, no vomiting. He denies any tinnitus or loss of hearing. He describes the symptoms to be different from his usual spells when his sugar is less than then 60. During these hypoglycemic episodes the patient passes out completely.Worsened with getting up and moving around. He has intermittent shortness of breath, not particularly worse today. He was found to have sinus rhythm with PVCs on his EKG. Rule out hypokalemic.CT Head and CXR normal. Patient admitted for SOB, possible anginal equivalent. Initial troponin       Review of Systems: negative for the following  Constitutional: Denies fever, chills, diaphoresis, appetite change and fatigue.  HEENT: Denies photophobia, eye pain, redness, hearing loss, ear pain, congestion, sore throat, rhinorrhea, sneezing, mouth sores, trouble swallowing, neck pain, neck stiffness and tinnitus.  Respiratory: Denies SOB, DOE, cough, chest tightness, and wheezing.  Cardiovascular: Denies chest pain, palpitations and leg swelling.  Gastrointestinal: Denies nausea, vomiting, abdominal pain, diarrhea, constipation, blood in stool and abdominal distention.  Genitourinary: Denies dysuria, urgency, frequency, hematuria, flank pain and difficulty urinating.  Musculoskeletal: Denies myalgias, back pain, joint swelling, arthralgias and gait problem.  Skin: Denies pallor, rash and wound.  Neurological: Head spinning and lightheadedness seizures, syncope, weakness, light-headedness, numbness and headaches.  Hematological: Denies adenopathy. Easy bruising, personal or family bleeding  history  Psychiatric/Behavioral: Denies suicidal ideation, mood changes, confusion, nervousness, sleep disturbance and agitation       Past Medical History  Diagnosis Date  . Prostate cancer dx'd 1993    surg only  . Colon cancer dx'd 2008    surg only  . Hypertension   . Diabetes mellitus without complication   . Adenomatous polyps 09/09/2008  . Arthritis      Past Surgical History  Procedure Laterality Date  . Laparoscopic assisted right hemicolectomy  09/21/07  . Colonoscopy    . Esophagogastroduodenoscopy    . Eye surgery      cataract surgery, right eye  . Cataract extraction w/phaco Left 06/10/2013    Procedure: CATARACT EXTRACTION PHACO AND INTRAOCULAR LENS PLACEMENT (IOC);  Surgeon: Adonis Brook, MD;  Location: [facility];  Service: Ophthalmology;  Laterality: Left;      Social History:  reports that he quit smoking about 30 years ago. His smoking use included Cigarettes. He smoked 0.00 packs per day for 1 year. He has never used smokeless tobacco. He reports that he drinks alcohol. He reports that he does not use illicit drugs.    No Known Allergies  Family History  Problem Relation Age of Onset  . Diabetes       Prior to Admission medications   Medication Sig Start Date End Date Taking? Authorizing Provider  amLODipine (NORVASC) 10 MG tablet Take 10 mg by mouth daily.   Yes Historical Provider, MD  atorvastatin (LIPITOR) 40 MG tablet Take 40 mg by mouth daily.   Yes Historical Provider, MD  chlorthalidone (HYGROTON) 25 MG tablet Take 25 mg by mouth daily.   Yes Historical Provider, MD  pioglitazone (ACTOS) 15 MG tablet Take 15 mg by mouth daily.   Yes Historical Provider, MD  potassium chloride SA (K-DUR,KLOR-CON) 20 MEQ tablet Take 20 mEq  by mouth daily.   Yes Historical Provider, MD     Physical Exam: Filed Vitals:   05/20/14 1532 05/20/14 1630 05/20/14 1647 05/20/14 1708  BP:  143/57  155/57  Pulse:    61  Temp: 97.5 F (36.4 C)   97.6 F (36.4 C)   TempSrc:    Oral  Resp:  17  18  Height:    5\' 6"  (1.676 m)  Weight:    96.798 kg (213 lb 6.4 oz)  SpO2:   98% 100%     Constitutional: Vital signs reviewed. Patient is a well-developed and well-nourished in no acute distress and cooperative with exam. Alert and oriented x3.  Head: Normocephalic and atraumatic  Ear: TM normal bilaterally  Mouth: no erythema or exudates, MMM  Eyes: PERRL, EOMI, conjunctivae normal, No scleral icterus.  Neck: Supple, Trachea midline normal ROM, No JVD, mass, thyromegaly, or carotid bruit present.  Cardiovascular: RRR, S1 normal, S2 normal, no MRG, pulses symmetric and intact bilaterally  Pulmonary/Chest: CTAB, no wheezes, rales, or rhonchi  Abdominal: Soft. Non-tender, non-distended, bowel sounds are normal, no masses, organomegaly, or guarding present.  GU: no CVA tenderness Musculoskeletal: No joint deformities, erythema, or stiffness, ROM full and no nontender Ext: no edema and no cyanosis, pulses palpable bilaterally (DP and PT)  Hematology: no cervical, inginal, or axillary adenopathy.  Neurological: A&O x3, Strenght is normal and symmetric bilaterally, cranial nerve II-XII are grossly intact, no focal motor deficit, sensory intact to light touch bilaterally. Horizontal nystagmus Skin: Warm, dry and intact. No rash, cyanosis, or clubbing.  Psychiatric: Normal mood and affect. speech and behavior is normal. Judgment and thought content normal. Cognition and memory are normal.       Labs on Admission:    Basic Metabolic Panel:  Recent Labs Lab 05/20/14 1433  NA 138  K 2.8*  CL 95*  CO2 24  GLUCOSE 152*  BUN 45*  CREATININE 1.81*  CALCIUM 10.2   Liver Function Tests:  Recent Labs Lab 05/20/14 1433  AST 21  ALT 12  ALKPHOS 71  BILITOT 0.6  PROT 8.1  ALBUMIN 3.7   No results found for this basename: LIPASE, AMYLASE,  in the last 168 hours No results found for this basename: AMMONIA,  in the last 168 hours CBC:  Recent  Labs Lab 05/20/14 1433  WBC 6.3  HGB 12.7*  HCT 37.0*  MCV 78.4  PLT 218   Cardiac Enzymes: No results found for this basename: CKTOTAL, CKMB, CKMBINDEX, TROPONINI,  in the last 168 hours  BNP (last 3 results)  Recent Labs  05/20/14 1435  PROBNP 183.3      CBG:  Recent Labs Lab 05/20/14 1425  GLUCAP 121*    Radiological Exams on Admission: Dg Chest 1 View  05/20/2014   CLINICAL DATA:  Difficulty breathing and cough  EXAM: CHEST - 1 VIEW  COMPARISON:  April 10, 2013  FINDINGS: Patient is somewhat rotated. There is no edema or consolidation. The heart size and pulmonary vascularity are normal. No adenopathy. No bone lesions.  IMPRESSION: No edema or consolidation.   Electronically Signed   By: Lowella Grip M.D.   On: 05/20/2014 15:11   Ct Head Wo Contrast  05/20/2014   CLINICAL DATA:  Headaches and dizziness. Slurred speech. History of prostate and colon cancer.  EXAM: CT HEAD WITHOUT CONTRAST  TECHNIQUE: Contiguous axial images were obtained from the base of the skull through the vertex without intravenous contrast.  COMPARISON:  02/18/2014 bone  scan, 04/07/2010 CT and 11/05/2009 brain MR.  FINDINGS: No intracranial hemorrhage.  Small vessel disease type changes without CT evidence of large acute infarct.  Mild atrophy without hydrocephalus.  Vascular calcifications.  Frontal sinuses Minimal mucosal thickening. Partial opacification/mucosal thickening ethmoid sinus air cells. Minimal right maxillary sinus mucosal thickening.  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 This may reflect result of prior trauma, infection or possibly congenital in origin. The lack of change since 2011 and the fact that there is no abnormal radiotracer uptake in this region on the relatively recent bone scan suggests that this is not related to malignancy.  IMPRESSION: No intracranial hemorrhage.  Small vessel disease type changes without CT evidence of large  acute infarct.  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 This may reflect result of prior trauma, infection or possibly congenital in origin.  Frontal sinuses Minimal mucosal thickening. Partial opacification/mucosal thickening ethmoid sinus air cells. Minimal right maxillary sinus mucosal thickening.   Electronically Signed   By: Chauncey Cruel M.D.   On: 05/20/2014 15:51    EKG: Independently reviewed.  Sinus rhythm Paired ventricular premature complexes   Assessment/Plan Active Problems:   Vertigo   Vertigo Benign positional vs central vertigo No recent history of either infection therefore doubt labyrinthitis Given his risk factors will obtain an MRI of the brain, rule out metastases, CVA Loss obtain a 2-D echo given PVCs on his EKG Meclizine/Ativan to treat his symptoms Will also start the patient on aspirin until stroke is ruled out PT/OT  Hypertension continue chlorthalidone and Norvasc  Hypokalemia replete, continue telemetry  Diabetes Continue Actos, check hemoglobin A1c patient on sliding scale initially   Shortness of breath Because of his history of cancer in the past, d-dimer was checked This is abnormal therefore we'll do a V/Q /Doppler of the lower extremities Continue nebulizer treatments, chest x-ray negative  Chronic kidney disease stage IV Patient's creatinine is at baseline, we'll continue to monitor  Code Status:   full Family Communication: bedside Disposition Plan: admit   Time spent: 70 mins   [facility] Pager [phone]  If 7PM-7AM, please contact night-coverage [email or web address] Password [facility] 05/20/2014, 5:47 PM

## 2014-05-20 NOTE — ED Provider Notes (Signed)
CSN: [id number]     Arrival date & time 05/20/14  1415 History   First MD Initiated Contact with Patient 05/20/14 1430     Chief Complaint  Patient presents with  . Dizziness     (Consider location/radiation/quality/duration/timing/severity/associated sxs/prior Treatment) Patient is a 80 y.o. male presenting with dizziness. The history is provided by the patient.  Dizziness Quality:  Head spinning and lightheadedness Severity:  Moderate Onset quality:  Sudden Timing:  Intermittent Progression:  Worsening Chronicity:  New Context: bending over   Context: not with ear pain, not with loss of consciousness and not when urinating   Relieved by:  Being still Worsened by:  Nothing tried Associated symptoms: shortness of breath   Associated symptoms: no chest pain and no vomiting     Past Medical History  Diagnosis Date  . Prostate cancer dx'd 1993    surg only  . Colon cancer dx'd 2008    surg only  . Hypertension   . Diabetes mellitus without complication   . Adenomatous polyps 09/09/2008  . Arthritis    Past Surgical History  Procedure Laterality Date  . Laparoscopic assisted right hemicolectomy  09/21/07  . Colonoscopy    . Esophagogastroduodenoscopy    . Eye surgery      cataract surgery, right eye  . Cataract extraction w/phaco Left 06/10/2013    Procedure: CATARACT EXTRACTION PHACO AND INTRAOCULAR LENS PLACEMENT (IOC);  Surgeon: Adonis Brook, MD;  Location: [place];  Service: Ophthalmology;  Laterality: Left;   Family History  Problem Relation Age of Onset  . Diabetes     History  Substance Use Topics  . Smoking status: Former Smoker -- 1 years    Types: Cigarettes    Quit date: 06/10/1983  . Smokeless tobacco: Never Used  . Alcohol Use: Yes     Comment: occasional wine    Review of Systems  Constitutional: Negative for fever and chills.  Respiratory: Positive for shortness of breath. Negative for cough.   Cardiovascular: Negative for chest pain and leg  swelling.  Gastrointestinal: Negative for vomiting and abdominal pain.  Neurological: Positive for dizziness.  All other systems reviewed and are negative.     Allergies  Review of patient's allergies indicates no known allergies.  Home Medications   Prior to Admission medications   Medication Sig Start Date End Date Taking? Authorizing Provider  amLODipine (NORVASC) 10 MG tablet Take 10 mg by mouth daily.   Yes Historical Provider, MD  atorvastatin (LIPITOR) 40 MG tablet Take 40 mg by mouth daily.   Yes Historical Provider, MD  chlorthalidone (HYGROTON) 25 MG tablet Take 25 mg by mouth daily.   Yes Historical Provider, MD  pioglitazone (ACTOS) 15 MG tablet Take 15 mg by mouth daily.   Yes Historical Provider, MD  potassium chloride SA (K-DUR,KLOR-CON) 20 MEQ tablet Take 20 mEq by mouth daily.   Yes Historical Provider, MD   BP 159/57  Pulse 77  Temp(Src) 97.5 F (36.4 C)  Resp 17  SpO2 100% Physical Exam  Nursing note and vitals reviewed. Constitutional: He is oriented to person, place, and time. He appears well-developed and well-nourished. No distress.  HENT:  Head: Normocephalic and atraumatic.  Mouth/Throat: Oropharynx is clear and moist. No oropharyngeal exudate.  Eyes: EOM are normal. Pupils are equal, round, and reactive to light.  Neck: Normal range of motion. Neck supple.  Cardiovascular: Normal rate and regular rhythm.  Exam reveals no friction rub.   No murmur heard. Pulmonary/Chest: Breath sounds  normal. He is in respiratory distress (labored respirations). He has no wheezes. He has no rales.  Abdominal: He exhibits no distension. There is no tenderness. There is no rebound.  Musculoskeletal: Normal range of motion. He exhibits no edema.  Neurological: He is alert and oriented to person, place, and time. A cranial nerve deficit (horizontal nystagmus on exam) is present. He exhibits normal muscle tone. Coordination normal.  Skin: He is not diaphoretic.    ED  Course  Procedures (including critical care time) Labs Review Labs Reviewed  CBC - Abnormal; Notable for the following:    Hemoglobin 12.7 (*)    HCT 37.0 (*)    RDW 15.6 (*)    All other components within normal limits  COMPREHENSIVE METABOLIC PANEL - Abnormal; Notable for the following:    Potassium 2.8 (*)    Chloride 95 (*)    Glucose, Bld 152 (*)    BUN 45 (*)    Creatinine, Ser 1.81 (*)    GFR calc non Af Amer 32 (*)    GFR calc Af Amer 37 (*)    Anion gap 19 (*)    All other components within normal limits  CBG MONITORING, ED - Abnormal; Notable for the following:    Glucose-Capillary 121 (*)    All other components within normal limits  PRO B NATRIURETIC PEPTIDE  I-STAT TROPOININ, ED    Imaging Review Dg Chest 1 View  05/20/2014   CLINICAL DATA:  Difficulty breathing and cough  EXAM: CHEST - 1 VIEW  COMPARISON:  April 10, 2013  FINDINGS: Patient is somewhat rotated. There is no edema or consolidation. The heart size and pulmonary vascularity are normal. No adenopathy. No bone lesions.  IMPRESSION: No edema or consolidation.   Electronically Signed   By: Lowella Grip M.D.   On: 05/20/2014 15:11   Ct Head Wo Contrast  05/20/2014   CLINICAL DATA:  Headaches and dizziness. Slurred speech. History of prostate and colon cancer.  EXAM: CT HEAD WITHOUT CONTRAST  TECHNIQUE: Contiguous axial images were obtained from the base of the skull through the vertex without intravenous contrast.  COMPARISON:  02/18/2014 bone scan, 04/07/2010 CT and 11/05/2009 brain MR.  FINDINGS: No intracranial hemorrhage.  Small vessel disease type changes without CT evidence of large acute infarct.  Mild atrophy without hydrocephalus.  Vascular calcifications.  Frontal sinuses Minimal mucosal thickening. Partial opacification/mucosal thickening ethmoid sinus air cells. Minimal right maxillary sinus mucosal thickening.  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  This may reflect result of prior trauma, infection or possibly congenital in origin. The lack of change since 2011 and the fact that there is no abnormal radiotracer uptake in this region on the relatively recent bone scan suggests that this is not related to malignancy.  IMPRESSION: No intracranial hemorrhage.  Small vessel disease type changes without CT evidence of large acute infarct.  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 This may reflect result of prior trauma, infection or possibly congenital in origin.  Frontal sinuses Minimal mucosal thickening. Partial opacification/mucosal thickening ethmoid sinus air cells. Minimal right maxillary sinus mucosal thickening.   Electronically Signed   By: Chauncey Cruel M.D.   On: 05/20/2014 15:51     EKG Interpretation   Date/Time:  Thursday May 20 2014 14:26:25 EDT Ventricular Rate:  75 PR Interval:  152 QRS Duration: 146 QT Interval:  439 QTC Calculation: 490 R Axis:  67 Text Interpretation:  Sinus rhythm Paired ventricular premature complexes  Left bundle branch block LBBB similar to prior Confirmed by Mingo Amber  MD,  Deosha Werden ([id number]) on 05/20/2014 2:32:42 PM      MDM   Final diagnoses:  Vertigo  Hypokalemia  Shortness of breath    80 year old male presents with dizziness. Acute onset last night. Intermittent. Worsened with getting up and moving around. Described lightheadedness and room-spinning type of dizziness. Denies CP, but has SOB. Granddaughter states worse than his normal SOB. No N/V. Here with stable vitals. EKG similar to prior. Horizontal nystagmus on exam. TMs clear. Will give meclizine and valium. Will get CXR for his labored breathing. Dizziness improving. Labs show hypokalemia. CT Head and CXR normal. Patient admitted for SOB, possible anginal equivalent. Initial troponin normal.     Osvaldo Shipper, MD 05/20/14 [phone]

## 2014-05-20 NOTE — Progress Notes (Signed)
Pt did not want MRI tonight because after laying still for an hour with the VQ scan he knew even with medicine he would not be able to handle it. He also stated that he knew he was going to have to urinate before the MRI would be over and he would not be able to hold it. He wanted to wait and move around for a little while and have the chance to urinate. MRI called to say that they would schedule him for early tomorrow for another chance at MRI. Will continue to monitor.

## 2014-05-21 ENCOUNTER — Observation Stay (HOSPITAL_COMMUNITY): Payer: PRIVATE HEALTH INSURANCE

## 2014-05-21 DIAGNOSIS — I10 Essential (primary) hypertension: Secondary | ICD-10-CM

## 2014-05-21 DIAGNOSIS — E119 Type 2 diabetes mellitus without complications: Secondary | ICD-10-CM

## 2014-05-21 DIAGNOSIS — E876 Hypokalemia: Secondary | ICD-10-CM

## 2014-05-21 DIAGNOSIS — I517 Cardiomegaly: Secondary | ICD-10-CM

## 2014-05-21 DIAGNOSIS — M7989 Other specified soft tissue disorders: Secondary | ICD-10-CM

## 2014-05-21 DIAGNOSIS — R42 Dizziness and giddiness: Secondary | ICD-10-CM | POA: Diagnosis not present

## 2014-05-21 LAB — GLUCOSE, CAPILLARY
GLUCOSE-CAPILLARY: 75 mg/dL (ref 70–99)
GLUCOSE-CAPILLARY: 87 mg/dL (ref 70–99)
Glucose-Capillary: 102 mg/dL — ABNORMAL HIGH (ref 70–99)
Glucose-Capillary: 161 mg/dL — ABNORMAL HIGH (ref 70–99)

## 2014-05-21 LAB — COMPREHENSIVE METABOLIC PANEL
ALBUMIN: 3 g/dL — AB (ref 3.5–5.2)
ALK PHOS: 59 U/L (ref 39–117)
ALT: 10 U/L (ref 0–53)
AST: 19 U/L (ref 0–37)
Anion gap: 11 (ref 5–15)
BUN: 43 mg/dL — ABNORMAL HIGH (ref 6–23)
CHLORIDE: 102 meq/L (ref 96–112)
CO2: 25 meq/L (ref 19–32)
CREATININE: 1.64 mg/dL — AB (ref 0.50–1.35)
Calcium: 9.4 mg/dL (ref 8.4–10.5)
GFR calc Af Amer: 42 mL/min — ABNORMAL LOW (ref >90)
GFR, EST NON AFRICAN AMERICAN: 36 mL/min — AB (ref >90)
Glucose, Bld: 72 mg/dL (ref 70–99)
POTASSIUM: 3.8 meq/L (ref 3.7–5.3)
SODIUM: 138 meq/L (ref 137–147)
Total Bilirubin: 0.5 mg/dL (ref 0.3–1.2)
Total Protein: 7.1 g/dL (ref 6.0–8.3)

## 2014-05-21 LAB — CBC
HCT: 33.8 % — ABNORMAL LOW (ref 39.0–52.0)
Hemoglobin: 11.5 g/dL — ABNORMAL LOW (ref 13.0–17.0)
MCH: 26.6 pg (ref 26.0–34.0)
MCHC: 34 g/dL (ref 30.0–36.0)
MCV: 78.2 fL (ref 78.0–100.0)
PLATELETS: 204 10*3/uL (ref 150–400)
RBC: 4.32 MIL/uL (ref 4.22–5.81)
RDW: 15.7 % — AB (ref 11.5–15.5)
WBC: 5.6 10*3/uL (ref 4.0–10.5)

## 2014-05-21 LAB — HEMOGLOBIN A1C
HEMOGLOBIN A1C: 6.5 % — AB (ref <5.7)
MEAN PLASMA GLUCOSE: 140 mg/dL — AB (ref <117)

## 2014-05-21 LAB — LIPID PANEL
Cholesterol: 164 mg/dL (ref 0–200)
HDL: 50 mg/dL (ref >39)
LDL Cholesterol: 88 mg/dL (ref 0–99)
Total CHOL/HDL Ratio: 3.3 RATIO
Triglycerides: 132 mg/dL (ref <150)
VLDL: 26 mg/dL (ref 0–40)

## 2014-05-21 LAB — TSH: TSH: 1.13 u[IU]/mL (ref 0.350–4.500)

## 2014-05-21 MED ORDER — ENOXAPARIN SODIUM 40 MG/0.4ML ~~LOC~~ SOLN
40.0000 mg | SUBCUTANEOUS | Status: DC
  Administered 2014-05-21: 40 mg via SUBCUTANEOUS
  Filled 2014-05-21 (×2): qty 0.4

## 2014-05-21 NOTE — Progress Notes (Signed)
OT Cancellation Note  Patient Details Name: [name] MRN: [id number] DOB: [birth year]   Cancelled Treatment:    Reason Eval/Treat Not Completed: Patient at procedure or test/ unavailable. Pt at MRI per nursing. Will check back later today as schedule allows or in am.  Jules Schick [id number] 05/21/2014, 11:28 AM

## 2014-05-21 NOTE — Progress Notes (Signed)
Utilization Review Completed.[name] T7/31/2015  

## 2014-05-21 NOTE — Progress Notes (Signed)
Echocardiogram 2D Echocardiogram has been performed.  [name] 05/21/2014, 10:09 AM

## 2014-05-21 NOTE — Evaluation (Signed)
Physical Therapy Evaluation Patient Details Name: [name] MRN: [id number] DOB: Feb 19, 1924 Today's Date: 05/21/2014   History of Present Illness  80 year old male with a history of prostate cancer colon cancer in remission, comes in with acute onset vertigo.  Clinical Impression  Pt admitted with SOB and vertigo. Pt currently with functional limitations due to the deficits listed below (see PT Problem List).  Pt will benefit from skilled PT to increase their independence and safety with mobility to allow discharge to the venue listed below.  Pt reports dizziness started the night prior to admission and describes as off-balance, dizzy, and spinning which is short in duration.  Pt reports onset with movement however also states will come on at rest.  Eye movement, saccades, head shaking all WNL and pt denies symptoms, R eye lateral mass observed however pt states present for years and has not effected eye sight.  Pt without vertigo symptoms during mobility today, only c/o weakness and fatigue.      Follow Up Recommendations Home health PT;Supervision for mobility/OOB    Equipment Recommendations  None recommended by PT    Recommendations for Other Services       Precautions / Restrictions Precautions Precautions: Fall Restrictions Weight Bearing Restrictions: No      Mobility  Bed Mobility Overal bed mobility: Needs Assistance Bed Mobility: Supine to Sit     Supine to sit: Min assist     General bed mobility comments: assist for trunk  Transfers Overall transfer level: Needs assistance Equipment used: Rolling walker (2 wheeled) Transfers: Sit to/from Stand Sit to Stand: Min assist         General transfer comment: assist for reported weakness  Ambulation/Gait Ambulation/Gait assistance: Min assist Ambulation Distance (Feet): 200 Feet Assistive device: Rolling walker (2 wheeled) Gait Pattern/deviations: Step-through pattern;Decreased stride length;Trunk  flexed;Drifts right/left     General Gait Details: verbal cues for safe use of RW, pt denies dizziness only reports weakness and extreme fatigue, assist to steady  Stairs            Wheelchair Mobility    Modified Rankin (Stroke Patients Only)       Balance                                             Pertinent Vitals/Pain No pain    Home Living Family/patient expects to be discharged to:: Private residence Living Arrangements: Children Available Help at Discharge: Family;Available 24 hours/day Type of Home: House Home Access: Level entry     Home Layout: One level Home Equipment: Walker - 2 wheels;Cane - single point      Prior Function Level of Independence: Independent with assistive device(s)         Comments: uses SPC     Hand Dominance        Extremity/Trunk Assessment               Lower Extremity Assessment: Generalized weakness         Communication   Communication: No difficulties  Cognition Arousal/Alertness: Awake/alert Behavior During Therapy: WFL for tasks assessed/performed Overall Cognitive Status: Within Functional Limits for tasks assessed                      General Comments      Exercises        Assessment/Plan  PT Assessment Patient needs continued PT services  PT Diagnosis Difficulty walking;Generalized weakness   PT Problem List Decreased strength;Decreased activity tolerance;Decreased balance;Decreased mobility;Decreased knowledge of use of DME  PT Treatment Interventions Gait training;DME instruction;Functional mobility training;Therapeutic activities;Therapeutic exercise;Patient/family education   PT Goals (Current goals can be found in the Care Plan section) Acute Rehab PT Goals PT Goal Formulation: With patient Time For Goal Achievement: 05/28/14 Potential to Achieve Goals: Good    Frequency Min 3X/week   Barriers to discharge        Co-evaluation                End of Session   Activity Tolerance: Patient limited by fatigue Patient left: in bed;with call bell/phone within reach;with nursing/sitter in room      Functional Assessment Tool Used: clinical judgement Functional Limitation: Mobility: Walking and moving around Mobility: Walking and Moving Around Current Status [id number]): At least 20 percent but less than 40 percent impaired, limited or restricted Mobility: Walking and Moving Around Goal Status [phone]): At least 1 percent but less than 20 percent impaired, limited or restricted    Time: 0937-0957 PT Time Calculation (min): 20 min   Charges:   PT Evaluation $Initial PT Evaluation Tier I: 1 Procedure PT Treatments $Gait Training: 8-22 mins   PT G Codes:   Functional Assessment Tool Used: clinical judgement Functional Limitation: Mobility: Walking and moving around    Murray E 05/21/2014, 10:13 AM Carmelia Bake, PT, DPT 05/21/2014 Pager: [phone]

## 2014-05-21 NOTE — Progress Notes (Addendum)
[facility] PROGRESS NOTE  [name] [id number] DOB: [birth year] DOA: 05/20/2014 PCP: Elyn Peers, MD  Assessment/Plan: Vertigo  Benign positional vs central vertigo  No recent history of either infection therefore doubt labyrinthitis  -Await MRI and 2-D echo results -Continue aspirin till stroke is ruled out -Continue Meclizine/Ativan to treat his symptoms  -Improving, follow up on PT/OT  Hypertension  -continue chlorthalidone and Norvasc  Diabetes  Continue Actos and sliding scale insulin, hemoglobin A1c pending-follow   Shortness of breath  -Chest x-ray negative for acute infiltrates -VQ scan low probability, await Doppler results Chronic kidney disease stage IV  Patient's creatinine is at baseline, trending down-continue to monitor Hypokalemia  -Likely secondary to diuretic -K. repleted, follow and recheck     Code Status: Full Family Communication: None the bedside Disposition Plan: Pending clinical course/PT eval   Consultants:  None  Procedures:  2-D echo  Dopplers of lower extremity  Antibiotics:  None  HPI/Subjective: He states his not had any further dizziness/vertigo since admission, he denies chest pain and states breathing better today  Objective: Filed Vitals:   05/21/14 0515  BP: 134/44  Pulse: 61  Temp: 97.7 F (36.5 C)  Resp: 18    Intake/Output Summary (Last 24 hours) at 05/21/14 0925 Last data filed at 05/21/14 0801  Gross per 24 hour  Intake    675 ml  Output   1650 ml  Net   -975 ml   Filed Weights   05/20/14 1708  Weight: 96.798 kg (213 lb 6.4 oz)    Exam:  General: alert & oriented x 3 In NAD Cardiovascular: RRR, nl S1 s2 Respiratory: Decreased breath sounds at the bases, scattered rhonchi, no crackles Abdomen: soft +BS NT/ND, no masses palpable Extremities: No cyanosis and no edema    Data Reviewed: Basic Metabolic Panel:  Recent Labs Lab 05/20/14 1433 05/20/14 2235 05/21/14 0443  NA 138  --   138  K 2.8*  --  3.8  CL 95*  --  102  CO2 24  --  25  GLUCOSE 152*  --  72  BUN 45*  --  43*  CREATININE 1.81*  --  1.64*  CALCIUM 10.2  --  9.4  MG  --  2.2  --    Liver Function Tests:  Recent Labs Lab 05/20/14 1433 05/21/14 0443  AST 21 19  ALT 12 10  ALKPHOS 71 59  BILITOT 0.6 0.5  PROT 8.1 7.1  ALBUMIN 3.7 3.0*   No results found for this basename: LIPASE, AMYLASE,  in the last 168 hours No results found for this basename: AMMONIA,  in the last 168 hours CBC:  Recent Labs Lab 05/20/14 1433 05/21/14 0443  WBC 6.3 5.6  HGB 12.7* 11.5*  HCT 37.0* 33.8*  MCV 78.4 78.2  PLT 218 204   Cardiac Enzymes: No results found for this basename: CKTOTAL, CKMB, CKMBINDEX, TROPONINI,  in the last 168 hours BNP (last 3 results)  Recent Labs  05/20/14 1435  PROBNP 183.3   CBG:  Recent Labs Lab 05/20/14 1425 05/20/14 1810 05/20/14 2157 05/21/14 0723  GLUCAP 121* 105* 164* 75    No results found for this or any previous visit (from the past 240 hour(s)).   Studies: Dg Chest 1 View  05/20/2014   CLINICAL DATA:  Difficulty breathing and cough  EXAM: CHEST - 1 VIEW  COMPARISON:  April 10, 2013  FINDINGS: Patient is somewhat rotated. There is no edema or consolidation. The heart size  and pulmonary vascularity are normal. No adenopathy. No bone lesions.  IMPRESSION: No edema or consolidation.   Electronically Signed   By: Lowella Grip M.D.   On: 05/20/2014 15:11   Ct Head Wo Contrast  05/20/2014   CLINICAL DATA:  Headaches and dizziness. Slurred speech. History of prostate and colon cancer.  EXAM: CT HEAD WITHOUT CONTRAST  TECHNIQUE: Contiguous axial images were obtained from the base of the skull through the vertex without intravenous contrast.  COMPARISON:  02/18/2014 bone scan, 04/07/2010 CT and 11/05/2009 brain MR.  FINDINGS: No intracranial hemorrhage.  Small vessel disease type changes without CT evidence of large acute infarct.  Mild atrophy without  hydrocephalus.  Vascular calcifications.  Frontal sinuses Minimal mucosal thickening. Partial opacification/mucosal thickening ethmoid sinus air cells. Minimal right maxillary sinus mucosal thickening.  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 This may reflect result of prior trauma, infection or possibly congenital in origin. The lack of change since 2011 and the fact that there is no abnormal radiotracer uptake in this region on the relatively recent bone scan suggests that this is not related to malignancy.  IMPRESSION: No intracranial hemorrhage.  Small vessel disease type changes without CT evidence of large acute infarct.  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 This may reflect result of prior trauma, infection or possibly congenital in origin.  Frontal sinuses Minimal mucosal thickening. Partial opacification/mucosal thickening ethmoid sinus air cells. Minimal right maxillary sinus mucosal thickening.   Electronically Signed   By: Chauncey Cruel M.D.   On: 05/20/2014 15:51   Nm Pulmonary Perf And Vent  05/20/2014   CLINICAL DATA:  Difficulty breathing  EXAM: NUCLEAR MEDICINE VENTILATION - PERFUSION LUNG SCAN  Views: Anterior, posterior, left lateral, right lateral, RPO, LPO, RAO, LAO -ventilation and perfusion  Radionuclide: Technetium 40m DTPA-ventilation; Technetium 84m macroaggregated albumin-perfusion  Dose:  40.0 mCi-ventilation; 5.0 mCi-perfusion  Route of administration: Inhalation-ventilation; intravenous -perfusion  COMPARISON:  Chest radiograph May 20, 2014  FINDINGS: Ventilation: There is no appreciable ventilation defect. Radiotracer uptake is homogeneous and symmetric bilaterally.  Perfusion: There are no appreciable perfusion defects. Radiotracer uptake is homogeneous and symmetric bilaterally.  There is no appreciable ventilation/perfusion mismatch.  IMPRESSION: There are no appreciable ventilation or  perfusion defects. This study constitutes a very low probability of pulmonary embolus.   Electronically Signed   By: Lowella Grip M.D.   On: 05/20/2014 21:27    Scheduled Meds: . amLODipine  10 mg Oral Daily  . antiseptic oral rinse  7 mL Mouth Rinse BID  . aspirin  325 mg Oral Daily  . atorvastatin  40 mg Oral q1800  . chlorthalidone  25 mg Oral Daily  . enoxaparin (LOVENOX) injection  30 mg Subcutaneous Q24H  . insulin aspart  0-9 Units Subcutaneous TID WC  . meclizine  25 mg Oral TID  . pioglitazone  15 mg Oral Daily  . potassium chloride  40 mEq Oral TID  . sodium chloride  3 mL Intravenous Q12H   Continuous Infusions:   Active Problems:   Vertigo    Time spent: 52mins    Ceasia Elwell C  [facility] Pager [phone]. If 7PM-7AM, please contact night-coverage at [email or web address], password [facility] 05/21/2014, 9:25 AM  LOS: 1 day

## 2014-05-21 NOTE — Progress Notes (Signed)
*  PRELIMINARY RESULTS* Vascular Ultrasound Lower extremity venous duplex has been completed.  Preliminary findings:  No evidence of DVT. Large baker's cyst is noted on the left.  Landry Mellow, RDMS, RVT ' 05/21/2014, 9:33 AM

## 2014-05-21 NOTE — Evaluation (Signed)
Occupational Therapy Evaluation Patient Details Name: [name] MRN: [id number] DOB: Sep 06, 1924 Today's Date: 05/21/2014    History of Present Illness 80 year old male with a history of prostate cancer colon cancer in remission, comes in with acute onset vertigo.   Clinical Impression   Pt currently is able to perform ADL and functional transfers at min assist level. No dizziness/spinning reported with activity today. No report of diplopia. He states he is alittle more fatigued than usual but has assist at d/c. Will follow on acute to progress ADL independence for return home with family.     Follow Up Recommendations  No OT follow up    Equipment Recommendations  3 in 1 bedside comode    Recommendations for Other Services       Precautions / Restrictions Precautions Precautions: Fall Restrictions Weight Bearing Restrictions: No      Mobility Bed Mobility             Transfers Overall transfer level: Needs assistance Equipment used: Rolling walker (2 wheeled) Transfers: Sit to/from Stand Sit to Stand: Min guard         General transfer comment: min cues for hand placement.    Balance                                            ADL Overall ADL's : Needs assistance/impaired Eating/Feeding: Independent;Sitting   Grooming: Wash/dry hands;Set up;Sitting   Upper Body Bathing: Set up;Sitting   Lower Body Bathing: Minimal assistance;Sit to/from stand   Upper Body Dressing : Set up;Sitting   Lower Body Dressing: Minimal assistance;Sit to/from stand   Toilet Transfer: Minimal assistance;Stand-pivot;RW   Toileting- Clothing Manipulation and Hygiene: Minimal assistance;Sit to/from stand         General ADL Comments: Pt with some fatigue trying to lean forward and don socks but he is able with effort and time. Pt not able to cross LEs up to don socks that method. He currently reports no diplopia or dizziness/spinning with being OOB.       Vision                 Additional Comments: currently states no diplopia or difficulty with vision. had had R eye cataract surgery. has a mass R lateral eye but states this doesnt interfere with vision. saccades and tracking WFL.   Perception     Praxis      Pertinent Vitals/Pain No report of pain.     Hand Dominance     Extremity/Trunk Assessment Upper Extremity Assessment Upper Extremity Assessment: Generalized weakness          Communication Communication Communication: No difficulties   Cognition Arousal/Alertness: Awake/alert Behavior During Therapy: WFL for tasks assessed/performed Overall Cognitive Status: Within Functional Limits for tasks assessed                     General Comments       Exercises       Shoulder Instructions      Home Living Family/patient expects to be discharged to:: Private residence Living Arrangements: Children Available Help at Discharge: Family;Available 24 hours/day Type of Home: House Home Access: Level entry     Home Layout: One level     Bathroom Shower/Tub: Teacher, early years/pre: Standard     Home Equipment: Environmental consultant - 2 wheels;Cane - single point;Shower seat  Prior Functioning/Environment Level of Independence: Independent with assistive device(s)        Comments: uses SPC    OT Diagnosis: Generalized weakness   OT Problem List: Decreased strength;Decreased knowledge of use of DME or AE   OT Treatment/Interventions: Self-care/ADL training;Patient/family education;Therapeutic activities;DME and/or AE instruction    OT Goals(Current goals can be found in the care plan section) Acute Rehab OT Goals Patient Stated Goal: none stated OT Goal Formulation: With patient Time For Goal Achievement: 06/04/14 Potential to Achieve Goals: Good  OT Frequency: Min 2X/week   Barriers to D/C:            Co-evaluation              End of Session Equipment Utilized  During Treatment: Rolling walker  Activity Tolerance: Patient tolerated treatment well Patient left: in chair;with call bell/phone within reach;with chair alarm set   Time: 1210-1229 OT Time Calculation (min): 19 min Charges:  OT General Charges $OT Visit: 1 Procedure OT Evaluation $Initial OT Evaluation Tier I: 1 Procedure OT Treatments $Self Care/Home Management : 8-22 mins G-Codes: OT G-codes **NOT FOR INPATIENT CLASS** Functional Assessment Tool Used: clinical judgement Functional Limitation: Self care Self Care Current Status [id number]): At least 1 percent but less than 20 percent impaired, limited or restricted Self Care Goal Status [id number]): At least 1 percent but less than 20 percent impaired, limited or restricted  Jules Schick [id number] 05/21/2014, 12:44 PM

## 2014-05-22 DIAGNOSIS — R42 Dizziness and giddiness: Secondary | ICD-10-CM | POA: Diagnosis not present

## 2014-05-22 LAB — BASIC METABOLIC PANEL
Anion gap: 12 (ref 5–15)
BUN: 39 mg/dL — ABNORMAL HIGH (ref 6–23)
CO2: 23 mEq/L (ref 19–32)
Calcium: 9.3 mg/dL (ref 8.4–10.5)
Chloride: 104 mEq/L (ref 96–112)
Creatinine, Ser: 1.52 mg/dL — ABNORMAL HIGH (ref 0.50–1.35)
GFR, EST AFRICAN AMERICAN: 46 mL/min — AB (ref >90)
GFR, EST NON AFRICAN AMERICAN: 39 mL/min — AB (ref >90)
GLUCOSE: 85 mg/dL (ref 70–99)
POTASSIUM: 4.2 meq/L (ref 3.7–5.3)
Sodium: 139 mEq/L (ref 137–147)

## 2014-05-22 LAB — CBC
HCT: 34.9 % — ABNORMAL LOW (ref 39.0–52.0)
Hemoglobin: 11.8 g/dL — ABNORMAL LOW (ref 13.0–17.0)
MCH: 26.7 pg (ref 26.0–34.0)
MCHC: 33.8 g/dL (ref 30.0–36.0)
MCV: 79 fL (ref 78.0–100.0)
Platelets: 192 10*3/uL (ref 150–400)
RBC: 4.42 MIL/uL (ref 4.22–5.81)
RDW: 16 % — AB (ref 11.5–15.5)
WBC: 4.8 10*3/uL (ref 4.0–10.5)

## 2014-05-22 LAB — GLUCOSE, CAPILLARY
Glucose-Capillary: 85 mg/dL (ref 70–99)
Glucose-Capillary: 89 mg/dL (ref 70–99)

## 2014-05-22 MED ORDER — MECLIZINE HCL 25 MG PO TABS: 25.0000 mg | ORAL_TABLET | Freq: Three times a day (TID) | ORAL | Status: DC | PRN

## 2014-05-22 MED ORDER — ASPIRIN EC 81 MG PO TBEC: 81.0000 mg | DELAYED_RELEASE_TABLET | Freq: Every day | ORAL | Status: DC

## 2014-05-22 NOTE — Progress Notes (Addendum)
NCM spoke to pt and gave permission to speak with dtr, Ludwin Rota. Contacted dtr at home. Offered choice for [facility]. She requested [facility] for [facility] PT/OT. Pt has old RW at home. Requesting RW for home. Notified. [facility] rep for [facility] and DME for scheduled dc home today. Jonnie Finner RN CCM Case Mgmt phone [phone]

## 2014-05-22 NOTE — Discharge Summary (Signed)
Physician Discharge Summary  [name] MRN: [id number] DOB/AGE: Aug 10, 1924 80 y.o.  PCP: Elyn Peers, MD   Admit date: 05/20/2014 Discharge date: 05/22/2014  Discharge Diagnoses:     Active Problems:   Vertigo  hypertension Diabetes CK D. stage IV stable  follow up recommendations Follow up with PCP in 5-7 days CBC, BMP in one week    Medication List         amLODipine 10 MG tablet  Commonly known as:  NORVASC  Take 10 mg by mouth daily.     aspirin EC 81 MG tablet  Take 1 tablet (81 mg total) by mouth daily.     atorvastatin 40 MG tablet  Commonly known as:  LIPITOR  Take 40 mg by mouth daily.     chlorthalidone 25 MG tablet  Commonly known as:  HYGROTON  Take 25 mg by mouth daily.     meclizine 25 MG tablet  Commonly known as:  ANTIVERT  Take 1 tablet (25 mg total) by mouth 3 (three) times daily as needed for dizziness.     pioglitazone 15 MG tablet  Commonly known as:  ACTOS  Take 15 mg by mouth daily.     potassium chloride SA 20 MEQ tablet  Commonly known as:  K-DUR,KLOR-CON  Take 20 mEq by mouth daily.        Discharge Condition: Stable Disposition: 01-Home or Self Care   Consults:    Significant Diagnostic Studies: Dg Chest 1 View  05/20/2014   CLINICAL DATA:  Difficulty breathing and cough  EXAM: CHEST - 1 VIEW  COMPARISON:  April 10, 2013  FINDINGS: Patient is somewhat rotated. There is no edema or consolidation. The heart size and pulmonary vascularity are normal. No adenopathy. No bone lesions.  IMPRESSION: No edema or consolidation.   Electronically Signed   By: Lowella Grip M.D.   On: 05/20/2014 15:11   Ct Head Wo Contrast  05/20/2014   CLINICAL DATA:  Headaches and dizziness. Slurred speech. History of prostate and colon cancer.  EXAM: CT HEAD WITHOUT CONTRAST  TECHNIQUE: Contiguous axial images were obtained from the base of the skull through the vertex without intravenous contrast.  COMPARISON:  02/18/2014 bone scan,  04/07/2010 CT and 11/05/2009 brain MR.  FINDINGS: No intracranial hemorrhage.  Small vessel disease type changes without CT evidence of large acute infarct.  Mild atrophy without hydrocephalus.  Vascular calcifications.  Frontal sinuses Minimal mucosal thickening. Partial opacification/mucosal thickening ethmoid sinus air cells. Minimal right maxillary sinus mucosal thickening.  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 This may reflect result of prior trauma, infection or possibly congenital in origin. The lack of change since 2011 and the fact that there is no abnormal radiotracer uptake in this region on the relatively recent bone scan suggests that this is not related to malignancy.  IMPRESSION: No intracranial hemorrhage.  Small vessel disease type changes without CT evidence of large acute infarct.  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 This may reflect result of prior trauma, infection or possibly congenital in origin.  Frontal sinuses Minimal mucosal thickening. Partial opacification/mucosal thickening ethmoid sinus air cells. Minimal right maxillary sinus mucosal thickening.   Electronically Signed   By: Chauncey Cruel M.D.   On: 05/20/2014 15:51   Mr Jodene Nam Head Wo Contrast  05/21/2014   CLINICAL DATA:  Sudden onset of vertigo and diplopia. Evaluate for stroke.  EXAM: MRI  HEAD WITHOUT CONTRAST  MRA HEAD WITHOUT CONTRAST  MRA NECK WITHOUT CONTRAST  TECHNIQUE: Multiplanar, multiecho pulse sequences of the brain and surrounding structures were obtained without intravenous contrast. Angiographic images of the Circle of Willis were obtained using MRA technique without intravenous contrast. Angiographic images of the neck were obtained using MRA technique without intravenous contrast. Carotid stenosis measurements (when applicable) are obtained utilizing NASCET criteria, using the distal internal carotid diameter as the  denominator.  COMPARISON:  Head CT 05/20/2014.  Head MRI/ MRA 11/05/2009.  FINDINGS: MRI HEAD FINDINGS  There is no evidence of acute infarct, intracranial hemorrhage, mass, midline shift, or extra-axial fluid collection. Small right temporal skull defect with associated small temporal encephalocele is again seen as noted on prior CT. This appears slightly larger than on the prior MRI with slightly progressive encephalomalacia and gliosis of the involved portion of temporal lobe. There is moderate cerebral atrophy. Patchy and confluent T2 hyperintensities in the subcortical and deep cerebral white matter are nonspecific but compatible with mild to moderate chronic small vessel ischemic disease.  Prior bilateral cataract extraction is noted. Mild bilateral frontal sinus and anterior ethmoid air cell mucosal thickening is noted. Mastoid air cells are clear. Major intracranial vascular flow voids are preserved.  MRA HEAD FINDINGS  Visualized distal vertebral arteries are patent and codominant. The left PICA origin is patent. Right PICA origin was not imaged. Right AICA origin is patent. Left AICA is not visualized. SCA origins are patent. PCAs are unremarkable aside from mild branch vessel irregularity. Posterior communicating arteries are not clearly identified. Internal carotid arteries are patent from skullbase to carotid termini. Irregularity is again seen of both carotid siphons compatible with atherosclerosis. Mild to moderate narrowing of the anterior left cavernous carotid does not appear significantly changed. ACAs and MCAs are unremarkable aside from mild branch vessel irregularity. No intracranial aneurysm is identified.  MRA NECK FINDINGS  Examination is moderately to severely degraded by motion and noncontrast technique. 3 vessel aortic arch. Common carotid, cervical internal carotid, and vertebral arteries are patent bilaterally with antegrade flow. No gross high-grade stenosis is identified.  IMPRESSION:  1. No acute infarct. 2. 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 3. Mild to moderate chronic small vessel ischemic disease. 4. No evidence of major intracranial arterial occlusion. Mild intracranial atherosclerosis, with greatest involvement of the left cavernous carotid as above, not significantly changed. 5. Limited neck MRA due to motion and lack of contrast. Cervical carotid and vertebral arteries are patent without gross high-grade stenosis.   Electronically Signed   By: Logan Bores   On: 05/21/2014 12:16   Mr Angiogram Neck Wo Contrast  05/21/2014   CLINICAL DATA:  Sudden onset of vertigo and diplopia. Evaluate for stroke.  EXAM: MRI HEAD WITHOUT CONTRAST  MRA HEAD WITHOUT CONTRAST  MRA NECK WITHOUT CONTRAST  TECHNIQUE: Multiplanar, multiecho pulse sequences of the brain and surrounding structures were obtained without intravenous contrast. Angiographic images of the Circle of Willis were obtained using MRA technique without intravenous contrast. Angiographic images of the neck were obtained using MRA technique without intravenous contrast. Carotid stenosis measurements (when applicable) are obtained utilizing NASCET criteria, using the distal internal carotid diameter as the denominator.  COMPARISON:  Head CT 05/20/2014.  Head MRI/ MRA 11/05/2009.  FINDINGS: MRI HEAD FINDINGS  There is no evidence of acute infarct, intracranial hemorrhage, mass, midline shift, or extra-axial fluid collection. Small right temporal  skull defect with associated small temporal encephalocele is again seen as noted on prior CT. This appears slightly larger than on the prior MRI with slightly progressive encephalomalacia and gliosis of the involved portion of temporal lobe. There is moderate cerebral atrophy. Patchy and confluent T2 hyperintensities in the subcortical and deep cerebral white matter are  nonspecific but compatible with mild to moderate chronic small vessel ischemic disease.  Prior bilateral cataract extraction is noted. Mild bilateral frontal sinus and anterior ethmoid air cell mucosal thickening is noted. Mastoid air cells are clear. Major intracranial vascular flow voids are preserved.  MRA HEAD FINDINGS  Visualized distal vertebral arteries are patent and codominant. The left PICA origin is patent. Right PICA origin was not imaged. Right AICA origin is patent. Left AICA is not visualized. SCA origins are patent. PCAs are unremarkable aside from mild branch vessel irregularity. Posterior communicating arteries are not clearly identified. Internal carotid arteries are patent from skullbase to carotid termini. Irregularity is again seen of both carotid siphons compatible with atherosclerosis. Mild to moderate narrowing of the anterior left cavernous carotid does not appear significantly changed. ACAs and MCAs are unremarkable aside from mild branch vessel irregularity. No intracranial aneurysm is identified.  MRA NECK FINDINGS  Examination is moderately to severely degraded by motion and noncontrast technique. 3 vessel aortic arch. Common carotid, cervical internal carotid, and vertebral arteries are patent bilaterally with antegrade flow. No gross high-grade stenosis is identified.  IMPRESSION: 1. No acute infarct. 2. 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 3. Mild to moderate chronic small vessel ischemic disease. 4. No evidence of major intracranial arterial occlusion. Mild intracranial atherosclerosis, with greatest involvement of the left cavernous carotid as above, not significantly changed. 5. Limited neck MRA due to motion and lack of contrast. Cervical carotid and vertebral arteries are patent without gross high-grade stenosis.   Electronically Signed   By: Logan Bores    On: 05/21/2014 12:16   Mr Brain Wo Contrast  05/21/2014   CLINICAL DATA:  Sudden onset of vertigo and diplopia. Evaluate for stroke.  EXAM: MRI HEAD WITHOUT CONTRAST  MRA HEAD WITHOUT CONTRAST  MRA NECK WITHOUT CONTRAST  TECHNIQUE: Multiplanar, multiecho pulse sequences of the brain and surrounding structures were obtained without intravenous contrast. Angiographic images of the Circle of Willis were obtained using MRA technique without intravenous contrast. Angiographic images of the neck were obtained using MRA technique without intravenous contrast. Carotid stenosis measurements (when applicable) are obtained utilizing NASCET criteria, using the distal internal carotid diameter as the denominator.  COMPARISON:  Head CT 05/20/2014.  Head MRI/ MRA 11/05/2009.  FINDINGS: MRI HEAD FINDINGS  There is no evidence of acute infarct, intracranial hemorrhage, mass, midline shift, or extra-axial fluid collection. Small right temporal skull defect with associated small temporal encephalocele is again seen as noted on prior CT. This appears slightly larger than on the prior MRI with slightly progressive encephalomalacia and gliosis of the involved portion of temporal lobe. There is moderate cerebral atrophy. Patchy and confluent T2 hyperintensities in the subcortical and deep cerebral white matter are nonspecific but compatible with mild to moderate chronic small vessel ischemic disease.  Prior bilateral cataract extraction is noted. Mild bilateral frontal sinus and anterior ethmoid air cell mucosal thickening is noted. Mastoid air cells are clear. Major intracranial vascular flow voids are preserved.  MRA HEAD FINDINGS  Visualized distal vertebral arteries are patent and codominant. The  left PICA origin is patent. Right PICA origin was not imaged. Right AICA origin is patent. Left AICA is not visualized. SCA origins are patent. PCAs are unremarkable aside from mild branch vessel irregularity. Posterior communicating  arteries are not clearly identified. Internal carotid arteries are patent from skullbase to carotid termini. Irregularity is again seen of both carotid siphons compatible with atherosclerosis. Mild to moderate narrowing of the anterior left cavernous carotid does not appear significantly changed. ACAs and MCAs are unremarkable aside from mild branch vessel irregularity. No intracranial aneurysm is identified.  MRA NECK FINDINGS  Examination is moderately to severely degraded by motion and noncontrast technique. 3 vessel aortic arch. Common carotid, cervical internal carotid, and vertebral arteries are patent bilaterally with antegrade flow. No gross high-grade stenosis is identified.  IMPRESSION: 1. No acute infarct. 2. 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 3. Mild to moderate chronic small vessel ischemic disease. 4. No evidence of major intracranial arterial occlusion. Mild intracranial atherosclerosis, with greatest involvement of the left cavernous carotid as above, not significantly changed. 5. Limited neck MRA due to motion and lack of contrast. Cervical carotid and vertebral arteries are patent without gross high-grade stenosis.   Electronically Signed   By: Logan Bores   On: 05/21/2014 12:16   Nm Pulmonary Perf And Vent  05/20/2014   CLINICAL DATA:  Difficulty breathing  EXAM: NUCLEAR MEDICINE VENTILATION - PERFUSION LUNG SCAN  Views: Anterior, posterior, left lateral, right lateral, RPO, LPO, RAO, LAO -ventilation and perfusion  Radionuclide: Technetium 16mDTPA-ventilation; Technetium 939macroaggregated albumin-perfusion  Dose:  40.0 mCi-ventilation; 5.0 mCi-perfusion  Route of administration: Inhalation-ventilation; intravenous -perfusion  COMPARISON:  Chest radiograph May 20, 2014  FINDINGS: Ventilation: There is no appreciable ventilation defect. Radiotracer uptake is  homogeneous and symmetric bilaterally.  Perfusion: There are no appreciable perfusion defects. Radiotracer uptake is homogeneous and symmetric bilaterally.  There is no appreciable ventilation/perfusion mismatch.  IMPRESSION: There are no appreciable ventilation or perfusion defects. This study constitutes a very low probability of pulmonary embolus.   Electronically Signed   By: WiLowella Grip.D.   On: 05/20/2014 21:27      Microbiology: No results found for this or any previous visit (from the past 240 hour(s)).   Labs: Results for orders placed during the hospital encounter of 05/20/14 (from the past 48 hour(s))  CBG MONITORING, ED     Status: Abnormal   Collection Time    05/20/14  2:25 PM      Result Value Ref Range   Glucose-Capillary 121 (*) 70 - 99 mg/dL   Comment 1 Documented in Chart     Comment 2 Notify RN    CBC     Status: Abnormal   Collection Time    05/20/14  2:33 PM      Result Value Ref Range   WBC 6.3  4.0 - 10.5 K/uL   RBC 4.72  4.22 - 5.81 MIL/uL   Hemoglobin 12.7 (*) 13.0 - 17.0 g/dL   HCT 37.0 (*) 39.0 - 52.0 %   MCV 78.4  78.0 - 100.0 fL   MCH 26.9  26.0 - 34.0 pg   MCHC 34.3  30.0 - 36.0 g/dL   RDW 15.6 (*) 11.5 - 15.5 %   Platelets 218  150 - 400 K/uL  COMPREHENSIVE METABOLIC PANEL     Status: Abnormal   Collection Time    05/20/14  2:33 PM      Result Value Ref Range   Sodium 138  137 - 147 mEq/L   Potassium 2.8 (*) 3.7 - 5.3 mEq/L   Comment: CRITICAL RESULT CALLED TO, READ BACK BY AND VERIFIED WITH:     JEFFFERY T RN [id number] 05/20/2014 BY BOVELL,T.   Chloride 95 (*) 96 - 112 mEq/L   CO2 24  19 - 32 mEq/L   Glucose, Bld 152 (*) 70 - 99 mg/dL   BUN 45 (*) 6 - 23 mg/dL   Creatinine, Ser 1.81 (*) 0.50 - 1.35 mg/dL   Calcium 10.2  8.4 - 10.5 mg/dL   Total Protein 8.1  6.0 - 8.3 g/dL   Albumin 3.7  3.5 - 5.2 g/dL   AST 21  0 - 37 U/L   ALT 12  0 - 53 U/L   Alkaline Phosphatase 71  39 - 117 U/L   Total Bilirubin 0.6  0.3 - 1.2 mg/dL   GFR calc  non Af Amer 32 (*) >90 mL/min   GFR calc Af Amer 37 (*) >90 mL/min   Comment: (NOTE)     The eGFR has been calculated using the CKD EPI equation.     This calculation has not been validated in all clinical situations.     eGFR's persistently <90 mL/min signify possible Chronic Kidney     Disease.   Anion gap 19 (*) 5 - 15  PRO B NATRIURETIC PEPTIDE     Status: None   Collection Time    05/20/14  2:35 PM      Result Value Ref Range   Pro B Natriuretic peptide (BNP) 183.3  0 - 450 pg/mL  TSH     Status: None   Collection Time    05/20/14  2:43 PM      Result Value Ref Range   TSH 1.130  0.350 - 4.500 uIU/mL   Comment: Performed at [facility], ED     Status: None   Collection Time    05/20/14  3:05 PM      Result Value Ref Range   Troponin i, poc 0.01  0.00 - 0.08 ng/mL   Comment 3            Comment: Due to the release kinetics of cTnI,     a negative result within the first hours     of the onset of symptoms does not rule out     myocardial infarction with certainty.     If myocardial infarction is still suspected,     repeat the test at appropriate intervals.  D-DIMER, QUANTITATIVE     Status: Abnormal   Collection Time    05/20/14  5:07 PM      Result Value Ref Range   D-Dimer, Quant 0.69 (*) 0.00 - 0.48 ug/mL-FEU   Comment:            AT THE INHOUSE ESTABLISHED CUTOFF     VALUE OF 0.48 ug/mL FEU,     THIS ASSAY HAS BEEN DOCUMENTED     IN THE LITERATURE TO HAVE     A SENSITIVITY AND NEGATIVE     PREDICTIVE VALUE OF AT LEAST     98 TO 99%.  THE TEST RESULT     SHOULD BE CORRELATED WITH     AN ASSESSMENT OF THE CLINICAL     PROBABILITY OF DVT / VTE.  GLUCOSE, CAPILLARY     Status: Abnormal  Collection Time    05/20/14  6:10 PM      Result Value Ref Range   Glucose-Capillary 105 (*) 70 - 99 mg/dL  GLUCOSE, CAPILLARY     Status: Abnormal   Collection Time    05/20/14  9:57 PM      Result Value Ref Range   Glucose-Capillary 164 (*) 70 -  99 mg/dL   Comment 1 Documented in Chart     Comment 2 Notify RN    MAGNESIUM     Status: None   Collection Time    05/20/14 10:35 PM      Result Value Ref Range   Magnesium 2.2  1.5 - 2.5 mg/dL  LIPID PANEL     Status: None   Collection Time    05/21/14  4:42 AM      Result Value Ref Range   Cholesterol 164  0 - 200 mg/dL   Triglycerides 132  <150 mg/dL   HDL 50  >39 mg/dL   Total CHOL/HDL Ratio 3.3     VLDL 26  0 - 40 mg/dL   LDL Cholesterol 88  0 - 99 mg/dL   Comment:            Total Cholesterol/HDL:CHD Risk     Coronary Heart Disease Risk Table                         Men   Women      1/2 Average Risk   3.4   3.3      Average Risk       5.0   4.4      2 X Average Risk   9.6   7.1      3 X Average Risk  23.4   11.0                Use the calculated Patient Ratio     above and the CHD Risk Table     to determine the patient's CHD Risk.                ATP III CLASSIFICATION (LDL):      <100     mg/dL   Optimal      100-129  mg/dL   Near or Above                        Optimal      130-159  mg/dL   Borderline      160-189  mg/dL   High      >190     mg/dL   Very High     Performed at [facility]     Status: Abnormal   Collection Time    05/21/14  4:42 AM      Result Value Ref Range   Hemoglobin A1C 6.5 (*) <5.7 %   Comment: (NOTE)                                                                               According to the ADA Clinical Practice Recommendations for 2011, when  HbA1c is used as a screening test:      >=6.5%   Diagnostic of Diabetes Mellitus               (if abnormal result is confirmed)     5.7-6.4%   Increased risk of developing Diabetes Mellitus     References:Diagnosis and Classification of Diabetes Mellitus,Diabetes     HUTM,5465,03(TWSFK 1):S62-S69 and Standards of Medical Care in             Diabetes - 2011,Diabetes CLEX,5170,01 (Suppl 1):S11-S61.   Mean Plasma Glucose 140 (*) <117 mg/dL   Comment: Performed at  [place]     Status: Abnormal   Collection Time    05/21/14  4:43 AM      Result Value Ref Range   Sodium 138  137 - 147 mEq/L   Potassium 3.8  3.7 - 5.3 mEq/L   Comment: DELTA CHECK NOTED     NO VISIBLE HEMOLYSIS   Chloride 102  96 - 112 mEq/L   CO2 25  19 - 32 mEq/L   Glucose, Bld 72  70 - 99 mg/dL   BUN 43 (*) 6 - 23 mg/dL   Creatinine, Ser 1.64 (*) 0.50 - 1.35 mg/dL   Calcium 9.4  8.4 - 10.5 mg/dL   Total Protein 7.1  6.0 - 8.3 g/dL   Albumin 3.0 (*) 3.5 - 5.2 g/dL   AST 19  0 - 37 U/L   ALT 10  0 - 53 U/L   Alkaline Phosphatase 59  39 - 117 U/L   Total Bilirubin 0.5  0.3 - 1.2 mg/dL   GFR calc non Af Amer 36 (*) >90 mL/min   GFR calc Af Amer 42 (*) >90 mL/min   Comment: (NOTE)     The eGFR has been calculated using the CKD EPI equation.     This calculation has not been validated in all clinical situations.     eGFR's persistently <90 mL/min signify possible Chronic Kidney     Disease.   Anion gap 11  5 - 15  CBC     Status: Abnormal   Collection Time    05/21/14  4:43 AM      Result Value Ref Range   WBC 5.6  4.0 - 10.5 K/uL   RBC 4.32  4.22 - 5.81 MIL/uL   Hemoglobin 11.5 (*) 13.0 - 17.0 g/dL   HCT 33.8 (*) 39.0 - 52.0 %   MCV 78.2  78.0 - 100.0 fL   MCH 26.6  26.0 - 34.0 pg   MCHC 34.0  30.0 - 36.0 g/dL   RDW 15.7 (*) 11.5 - 15.5 %   Platelets 204  150 - 400 K/uL  GLUCOSE, CAPILLARY     Status: None   Collection Time    05/21/14  7:23 AM      Result Value Ref Range   Glucose-Capillary 75  70 - 99 mg/dL  GLUCOSE, CAPILLARY     Status: Abnormal   Collection Time    05/21/14 11:59 AM      Result Value Ref Range   Glucose-Capillary 102 (*) 70 - 99 mg/dL  GLUCOSE, CAPILLARY     Status: None   Collection Time    05/21/14  4:51 PM      Result Value Ref Range   Glucose-Capillary 87  70 - 99 mg/dL  GLUCOSE, CAPILLARY     Status: Abnormal   Collection Time  05/21/14  9:44 PM      Result Value Ref Range    Glucose-Capillary 161 (*) 70 - 99 mg/dL   Comment 1 Documented in Chart     Comment 2 Notify RN    BASIC METABOLIC PANEL     Status: Abnormal   Collection Time    05/22/14  6:42 AM      Result Value Ref Range   Sodium 139  137 - 147 mEq/L   Potassium 4.2  3.7 - 5.3 mEq/L   Chloride 104  96 - 112 mEq/L   CO2 23  19 - 32 mEq/L   Glucose, Bld 85  70 - 99 mg/dL   BUN 39 (*) 6 - 23 mg/dL   Creatinine, Ser 1.52 (*) 0.50 - 1.35 mg/dL   Calcium 9.3  8.4 - 10.5 mg/dL   GFR calc non Af Amer 39 (*) >90 mL/min   GFR calc Af Amer 46 (*) >90 mL/min   Comment: (NOTE)     The eGFR has been calculated using the CKD EPI equation.     This calculation has not been validated in all clinical situations.     eGFR's persistently <90 mL/min signify possible Chronic Kidney     Disease.   Anion gap 12  5 - 15  CBC     Status: Abnormal   Collection Time    05/22/14  6:42 AM      Result Value Ref Range   WBC 4.8  4.0 - 10.5 K/uL   RBC 4.42  4.22 - 5.81 MIL/uL   Hemoglobin 11.8 (*) 13.0 - 17.0 g/dL   HCT 34.9 (*) 39.0 - 52.0 %   MCV 79.0  78.0 - 100.0 fL   MCH 26.7  26.0 - 34.0 pg   MCHC 33.8  30.0 - 36.0 g/dL   RDW 16.0 (*) 11.5 - 15.5 %   Platelets 192  150 - 400 K/uL  GLUCOSE, CAPILLARY     Status: None   Collection Time    05/22/14  7:28 AM      Result Value Ref Range   Glucose-Capillary 85  70 - 99 mg/dL     HPI :* 81 year old male with a history of prostate cancer colon cancer in remission, comes in with vertigo that started acutely last night. The patient had associated symptoms of blurry vision and diplopia. No nausea, no vomiting. He denies any tinnitus or loss of hearing. He describes the symptoms to be different from his usual spells when his sugar is less than then 60. During these hypoglycemic episodes the patient passes out completely.Worsened with getting up and moving around. He has intermittent shortness of breath, not particularly worse today. He was found to have sinus rhythm with  PVCs on his EKG. Rule out hypokalemic.CT Head and CXR normal. Patient admitted for SOB, possible anginal equivalent. Initial troponin     HOSPITAL COURSE: * Vertigo  Likely Benign positional vs central vertigo rule out  No recent history of URI infection therefore doubt labyrinthitis  MRI ruled out acute infarct, chronic small vessel ischemic disease, no evidence of intracranial arterial occlusion, cervical and carotid and vertebral arteries are widely patent without any high-grade stenosis -Continue aspirin for stroke prevention given his factors -Continue Meclizine 3 times a day when necessary as needed  -Improving, PT OT recommend home health   Hypertension  -continue chlorthalidone and Norvasc  Diabetes  Continue Actos and sliding scale insulin, hemoglobin A1c 6.5  Shortness of breath  -Chest  x-ray negative for acute infiltrates  -VQ scan low probability, venous Doppler negative  Chronic kidney disease stage IV  Patient's creatinine is at baseline, trending down-continue to monitor   Hypokalemia  -Likely secondary to diuretic  Repeat BMP in one week       Discharge Exam: * Blood pressure 163/69, pulse 81, temperature 98.4 F (36.9 C), temperature source Oral, resp. rate 18, height _0  (1.676 m), weight 96.798 kg (213 lb 6.4 oz), SpO2 97.00%.   General: alert & oriented x 3 In NAD  Cardiovascular: RRR, nl S1 s2  Respiratory: Decreased breath sounds at the bases, scattered rhonchi, no crackles  Abdomen: soft +BS NT/ND, no masses palpable  Extremities: No cyanosis and no edema         Discharge Instructions   Diet - low sodium heart healthy    Complete by:  As directed      Increase activity slowly    Complete by:  As directed            Follow-up Information   Follow up with Elyn Peers, MD. Schedule an appointment as soon as possible for a visit in 1 week.   Specialty:  Family Medicine   Contact information:   [address] [place]  [address] [phone]       Signed: Reyne Dumas 05/22/2014, 9:56 AM

## 2014-05-22 NOTE — Progress Notes (Signed)
CARE MANAGEMENT NOTE 05/22/2014  Patient:  [name],[name]   Account Number:  [id number]  Date Initiated:  05/22/2014  Documentation initiated by:  [facility]  Subjective/Objective Assessment:   Vertigo     Action/Plan:   Anticipated DC Date:  05/22/2014   Anticipated DC Plan:  [place]         [facility] Choice  HOME HEALTH   Choice offered to / List presented to:  C-4 Adult Children   DME arranged  [place]      DME agency  [place] arranged  [place].   Status of service:  Completed, signed off Medicare Important Message given?  NA - LOS <3 / Initial given by admissions (If response is "NO", the following Medicare IM given date fields will be blank) Date Medicare IM given:   Medicare IM given by:   Date Additional Medicare IM given:   Additional Medicare IM given by:    Discharge Disposition:  [place]  Per UR Regulation:    If discussed at Long Length of Stay Meetings, dates discussed:    Comments:  05/22/2014 1127 NCM spoke to pt and gave permission to speak with dtr, Theador Rota. Contacted dtr at home. Offered choice for [facility]. She requested [facility] for [facility] PT/OT. Pt has old RW at home. Requesting RW for home. Notified. [facility] rep for [facility] and DME for scheduled dc home today. Jonnie Finner RN CCM Case Mgmt phone [phone]

## 2014-05-22 NOTE — Progress Notes (Signed)
OT Cancellation Note  Patient Details Name: [name] MRN: [id number] DOB: 1924-09-12   Cancelled Treatment:    Reason Eval/Treat Not Completed: Patient declined, no reason specified;Other (comment). Pt preparing to d/c home  [name] 05/22/2014, 11:36 AM

## 2014-10-27 DIAGNOSIS — R972 Elevated prostate specific antigen [PSA]: Secondary | ICD-10-CM | POA: Diagnosis not present

## 2014-10-27 DIAGNOSIS — C61 Malignant neoplasm of prostate: Secondary | ICD-10-CM | POA: Diagnosis not present

## 2014-10-27 DIAGNOSIS — R339 Retention of urine, unspecified: Secondary | ICD-10-CM | POA: Diagnosis not present

## 2014-10-27 DIAGNOSIS — R309 Painful micturition, unspecified: Secondary | ICD-10-CM | POA: Diagnosis not present

## 2014-11-05 DIAGNOSIS — H4011X2 Primary open-angle glaucoma, moderate stage: Secondary | ICD-10-CM | POA: Diagnosis not present

## 2014-11-16 DIAGNOSIS — C61 Malignant neoplasm of prostate: Secondary | ICD-10-CM | POA: Diagnosis not present

## 2014-11-17 ENCOUNTER — Other Ambulatory Visit (HOSPITAL_COMMUNITY): Payer: Self-pay | Admitting: Urology

## 2014-11-17 DIAGNOSIS — R972 Elevated prostate specific antigen [PSA]: Secondary | ICD-10-CM

## 2014-11-29 ENCOUNTER — Encounter (HOSPITAL_COMMUNITY): Payer: Medicare Other

## 2014-12-02 DIAGNOSIS — H4011X2 Primary open-angle glaucoma, moderate stage: Secondary | ICD-10-CM | POA: Diagnosis not present

## 2014-12-03 ENCOUNTER — Encounter (HOSPITAL_COMMUNITY): Payer: Medicare Other

## 2014-12-03 DIAGNOSIS — J441 Chronic obstructive pulmonary disease with (acute) exacerbation: Secondary | ICD-10-CM | POA: Diagnosis not present

## 2014-12-03 DIAGNOSIS — I10 Essential (primary) hypertension: Secondary | ICD-10-CM | POA: Diagnosis not present

## 2014-12-03 DIAGNOSIS — F4322 Adjustment disorder with anxiety: Secondary | ICD-10-CM | POA: Diagnosis not present

## 2014-12-03 DIAGNOSIS — E1169 Type 2 diabetes mellitus with other specified complication: Secondary | ICD-10-CM | POA: Diagnosis not present

## 2014-12-15 ENCOUNTER — Encounter (HOSPITAL_COMMUNITY)
Admission: RE | Admit: 2014-12-15 | Discharge: 2014-12-15 | Disposition: A | Discharge status: Home / Self Care | Payer: Medicare Other | Source: Ambulatory Visit | Attending: Urology | Admitting: Urology

## 2014-12-15 ENCOUNTER — Encounter (HOSPITAL_COMMUNITY): Payer: Self-pay

## 2014-12-15 DIAGNOSIS — C61 Malignant neoplasm of prostate: Secondary | ICD-10-CM | POA: Diagnosis not present

## 2014-12-15 DIAGNOSIS — R972 Elevated prostate specific antigen [PSA]: Secondary | ICD-10-CM | POA: Insufficient documentation

## 2014-12-15 MED ORDER — TECHNETIUM TC 99M MEDRONATE IV KIT
25.0000 | PACK | Freq: Once | INTRAVENOUS | Status: AC | PRN
  Administered 2014-12-15: 25 via INTRAVENOUS

## 2015-01-07 DIAGNOSIS — H04123 Dry eye syndrome of bilateral lacrimal glands: Secondary | ICD-10-CM | POA: Diagnosis not present

## 2015-01-07 DIAGNOSIS — H4011X2 Primary open-angle glaucoma, moderate stage: Secondary | ICD-10-CM | POA: Diagnosis not present

## 2015-01-25 DIAGNOSIS — H4011X2 Primary open-angle glaucoma, moderate stage: Secondary | ICD-10-CM | POA: Diagnosis not present

## 2015-02-01 DIAGNOSIS — E1169 Type 2 diabetes mellitus with other specified complication: Secondary | ICD-10-CM | POA: Diagnosis not present

## 2015-02-01 DIAGNOSIS — J441 Chronic obstructive pulmonary disease with (acute) exacerbation: Secondary | ICD-10-CM | POA: Diagnosis not present

## 2015-02-01 DIAGNOSIS — Z0000 Encounter for general adult medical examination without abnormal findings: Secondary | ICD-10-CM | POA: Diagnosis not present

## 2015-02-15 DIAGNOSIS — C61 Malignant neoplasm of prostate: Secondary | ICD-10-CM | POA: Diagnosis not present

## 2015-02-15 DIAGNOSIS — R972 Elevated prostate specific antigen [PSA]: Secondary | ICD-10-CM | POA: Diagnosis not present

## 2015-02-23 DIAGNOSIS — C61 Malignant neoplasm of prostate: Secondary | ICD-10-CM | POA: Diagnosis not present

## 2015-02-24 DIAGNOSIS — I10 Essential (primary) hypertension: Secondary | ICD-10-CM | POA: Diagnosis not present

## 2015-02-24 DIAGNOSIS — H04123 Dry eye syndrome of bilateral lacrimal glands: Secondary | ICD-10-CM | POA: Diagnosis not present

## 2015-02-24 DIAGNOSIS — H4011X2 Primary open-angle glaucoma, moderate stage: Secondary | ICD-10-CM | POA: Diagnosis not present

## 2015-04-20 ENCOUNTER — Emergency Department (HOSPITAL_COMMUNITY): Payer: Medicare Other

## 2015-04-20 ENCOUNTER — Emergency Department (HOSPITAL_COMMUNITY)
Admission: EM | Admit: 2015-04-20 | Discharge: 2015-04-20 | Disposition: A | Discharge status: Home / Self Care | Payer: Medicare Other | Attending: Emergency Medicine | Admitting: Emergency Medicine

## 2015-04-20 ENCOUNTER — Encounter (HOSPITAL_COMMUNITY): Payer: Self-pay

## 2015-04-20 DIAGNOSIS — Y9389 Activity, other specified: Secondary | ICD-10-CM | POA: Insufficient documentation

## 2015-04-20 DIAGNOSIS — Z7982 Long term (current) use of aspirin: Secondary | ICD-10-CM | POA: Insufficient documentation

## 2015-04-20 DIAGNOSIS — Z86018 Personal history of other benign neoplasm: Secondary | ICD-10-CM | POA: Insufficient documentation

## 2015-04-20 DIAGNOSIS — Z79899 Other long term (current) drug therapy: Secondary | ICD-10-CM | POA: Insufficient documentation

## 2015-04-20 DIAGNOSIS — Y998 Other external cause status: Secondary | ICD-10-CM | POA: Insufficient documentation

## 2015-04-20 DIAGNOSIS — I10 Essential (primary) hypertension: Secondary | ICD-10-CM | POA: Diagnosis not present

## 2015-04-20 DIAGNOSIS — S0081XA Abrasion of other part of head, initial encounter: Secondary | ICD-10-CM | POA: Diagnosis not present

## 2015-04-20 DIAGNOSIS — Z23 Encounter for immunization: Secondary | ICD-10-CM | POA: Diagnosis not present

## 2015-04-20 DIAGNOSIS — R404 Transient alteration of awareness: Secondary | ICD-10-CM | POA: Diagnosis not present

## 2015-04-20 DIAGNOSIS — Z8546 Personal history of malignant neoplasm of prostate: Secondary | ICD-10-CM | POA: Insufficient documentation

## 2015-04-20 DIAGNOSIS — W19XXXA Unspecified fall, initial encounter: Secondary | ICD-10-CM

## 2015-04-20 DIAGNOSIS — M25561 Pain in right knee: Secondary | ICD-10-CM | POA: Diagnosis not present

## 2015-04-20 DIAGNOSIS — M1990 Unspecified osteoarthritis, unspecified site: Secondary | ICD-10-CM | POA: Insufficient documentation

## 2015-04-20 DIAGNOSIS — Z85038 Personal history of other malignant neoplasm of large intestine: Secondary | ICD-10-CM | POA: Insufficient documentation

## 2015-04-20 DIAGNOSIS — S60312A Abrasion of left thumb, initial encounter: Secondary | ICD-10-CM | POA: Insufficient documentation

## 2015-04-20 DIAGNOSIS — S8991XA Unspecified injury of right lower leg, initial encounter: Secondary | ICD-10-CM | POA: Insufficient documentation

## 2015-04-20 DIAGNOSIS — R52 Pain, unspecified: Secondary | ICD-10-CM

## 2015-04-20 DIAGNOSIS — M25461 Effusion, right knee: Secondary | ICD-10-CM | POA: Diagnosis not present

## 2015-04-20 DIAGNOSIS — E876 Hypokalemia: Secondary | ICD-10-CM

## 2015-04-20 DIAGNOSIS — W1839XA Other fall on same level, initial encounter: Secondary | ICD-10-CM | POA: Diagnosis not present

## 2015-04-20 DIAGNOSIS — Y92009 Unspecified place in unspecified non-institutional (private) residence as the place of occurrence of the external cause: Secondary | ICD-10-CM | POA: Diagnosis not present

## 2015-04-20 DIAGNOSIS — R531 Weakness: Secondary | ICD-10-CM | POA: Diagnosis not present

## 2015-04-20 LAB — CBC WITH DIFFERENTIAL/PLATELET
Basophils Absolute: 0 10*3/uL (ref 0.0–0.1)
Basophils Relative: 0 % (ref 0–1)
EOS ABS: 0 10*3/uL (ref 0.0–0.7)
Eosinophils Relative: 0 % (ref 0–5)
HCT: 33.7 % — ABNORMAL LOW (ref 39.0–52.0)
Hemoglobin: 11.5 g/dL — ABNORMAL LOW (ref 13.0–17.0)
Lymphocytes Relative: 19 % (ref 12–46)
Lymphs Abs: 1.3 10*3/uL (ref 0.7–4.0)
MCH: 27.1 pg (ref 26.0–34.0)
MCHC: 34.1 g/dL (ref 30.0–36.0)
MCV: 79.3 fL (ref 78.0–100.0)
MONO ABS: 0.7 10*3/uL (ref 0.1–1.0)
Monocytes Relative: 10 % (ref 3–12)
NEUTROS ABS: 5 10*3/uL (ref 1.7–7.7)
NEUTROS PCT: 71 % (ref 43–77)
PLATELETS: 193 10*3/uL (ref 150–400)
RBC: 4.25 MIL/uL (ref 4.22–5.81)
RDW: 15.2 % (ref 11.5–15.5)
WBC: 7.1 10*3/uL (ref 4.0–10.5)

## 2015-04-20 LAB — TROPONIN I

## 2015-04-20 LAB — BASIC METABOLIC PANEL
Anion gap: 7 (ref 5–15)
BUN: 33 mg/dL — AB (ref 6–20)
CALCIUM: 9.3 mg/dL (ref 8.9–10.3)
CHLORIDE: 101 mmol/L (ref 101–111)
CO2: 30 mmol/L (ref 22–32)
Creatinine, Ser: 1.77 mg/dL — ABNORMAL HIGH (ref 0.61–1.24)
GFR calc Af Amer: 38 mL/min — ABNORMAL LOW (ref >60)
GFR, EST NON AFRICAN AMERICAN: 33 mL/min — AB (ref >60)
Glucose, Bld: 134 mg/dL — ABNORMAL HIGH (ref 65–99)
Potassium: 3.1 mmol/L — ABNORMAL LOW (ref 3.5–5.1)
Sodium: 138 mmol/L (ref 135–145)

## 2015-04-20 MED ORDER — POTASSIUM CHLORIDE CRYS ER 20 MEQ PO TBCR: 20.0000 meq | EXTENDED_RELEASE_TABLET | Freq: Every day | ORAL | Status: DC

## 2015-04-20 MED ORDER — TETANUS-DIPHTH-ACELL PERTUSSIS 5-2.5-18.5 LF-MCG/0.5 IM SUSP
0.5000 mL | Freq: Once | INTRAMUSCULAR | Status: AC
  Administered 2015-04-20: 0.5 mL via INTRAMUSCULAR
  Filled 2015-04-20: qty 0.5

## 2015-04-20 MED ORDER — HYDROCODONE-ACETAMINOPHEN 5-325 MG PO TABS
ORAL_TABLET | ORAL | Status: DC
Start: 2015-04-20 — End: 2017-12-05

## 2015-04-20 MED ORDER — POTASSIUM CHLORIDE CRYS ER 20 MEQ PO TBCR
40.0000 meq | EXTENDED_RELEASE_TABLET | Freq: Once | ORAL | Status: AC
  Administered 2015-04-20: 40 meq via ORAL
  Filled 2015-04-20: qty 2

## 2015-04-20 NOTE — ED Notes (Addendum)
Per [facility]- Pt fell Monday.Pt reports "going up driveway and legs gave out"Pt c/o of Rt knee pain. No deformity mild swelling present. Good CMS present.

## 2015-04-20 NOTE — ED Notes (Signed)
Bed: WA20 Expected date:  Expected time:  Means of arrival:  Comments: EMS- 81yo M, fall, knee pain

## 2015-04-20 NOTE — ED Notes (Signed)
Attempted blood draw x2, unsuccessful.

## 2015-04-20 NOTE — Discharge Instructions (Signed)
Rest, Ice intermittently (in the first 24-48 hours), Gentle compression with an Ace wrap, and elevate (Limb above the level of the heart)   Take vicodin for breakthrough pain, do not drink alcohol, drive, care for children or do other critical tasks while taking vicodin.  Please be very careful not to fall! The pain medication and crutches puts you at risk for falls. Please rest as much as possible and try to not stay alone.   Please follow with your primary care doctor in the next 2 days for a check-up. They must obtain records for further management.   Do not hesitate to return to the Emergency Department for any new, worsening or concerning symptoms.

## 2015-04-20 NOTE — ED Provider Notes (Signed)
Medical screening examination/treatment/procedure(s) were conducted as a shared visit with non-physician practitioner(s) and myself.  I personally evaluated the patient during the encounter.   EKG Interpretation   Date/Time:  Wednesday April 20 2015 18:55:08 EDT Ventricular Rate:  70 PR Interval:  149 QRS Duration: 143 QT Interval:  433 QTC Calculation: 467 R Axis:   59 Text Interpretation:  Sinus rhythm Left bundle branch block No significant  change since last tracing Confirmed by Blanton Kardell  MD, Audwin Semper ([id number]) on  04/20/2015 8:46:40 PM     Patient here after likely mechanical fall 3 days ago. Complains of pain to his right knee since the event. Denies any headache or vomiting or confusion. X-rays noted. Patient's EKG and troponin within normal limits. Patient is mildly hypokalemic and will be given potassium. Stable for discharge  Lacretia Leigh, MD 04/20/15 2233

## 2015-04-20 NOTE — ED Notes (Signed)
Phlebotomy called for labs 

## 2015-04-20 NOTE — ED Notes (Signed)
Pt states on Monday he "fell out" hitting forehead, right knee and rt arm. Pt remembers event. Unsure why it happened. Pt states all the weight was on his right side. Pt denies any other complaints at this time. Only complaint is rt knee pain. Rt knee is painful to touch with swelling to knee joint. Pulses present

## 2015-04-20 NOTE — ED Provider Notes (Signed)
CSN: [id number]     Arrival date & time 04/20/15  1839 History   First MD Initiated Contact with Patient 04/20/15 1843     Chief Complaint  Patient presents with  . Knee Pain  . Fall    monday     (Consider location/radiation/quality/duration/timing/severity/associated sxs/prior Treatment) HPI   Blood pressure 166/71, pulse 67, temperature 98.3 F (36.8 C), temperature source Oral, resp. rate 21, SpO2 96 %.  [name] is a 81 y.o. male complaining of severe right knee pain status post fall 3 days ago. Patient cannot say exactly why he fell he thinks that his legs gave out he does not think that he lost consciousness, he is not anticoagulated. Patient states he was walking on his driveway, he fell forward onto the concrete, he hit his face and he has some abrasions. He denies any change in vision, pain or double vision with eye movement, nosebleeds, date, cervicalgia, weakness. He states that ambulation has been difficult, he rates his pain is severe and states it's better when he slightly flexes the knee. He hasn't taken any pain medication taken prior to arrival. He has no prior orthopedist. Patient lives with his daughter and son-in-law.   Past Medical History  Diagnosis Date  . Prostate cancer dx'd 1993    surg only  . Colon cancer dx'd 2008    surg only  . Hypertension   . Diabetes mellitus without complication   . Adenomatous polyps 09/09/2008  . Arthritis    Past Surgical History  Procedure Laterality Date  . Laparoscopic assisted right hemicolectomy  09/21/07  . Colonoscopy    . Esophagogastroduodenoscopy    . Eye surgery      cataract surgery, right eye  . Cataract extraction w/phaco Left 06/10/2013    Procedure: CATARACT EXTRACTION PHACO AND INTRAOCULAR LENS PLACEMENT (IOC);  Surgeon: Adonis Brook, MD;  Location: [place];  Service: Ophthalmology;  Laterality: Left;   Family History  Problem Relation Age of Onset  . Diabetes     History  Substance Use Topics  .  Smoking status: Former Smoker -- 1 years    Types: Cigarettes    Quit date: 06/10/1983  . Smokeless tobacco: Never Used  . Alcohol Use: Yes     Comment: occasional wine    Review of Systems  10 systems reviewed and found to be negative, except as noted in the HPI.   Allergies  Review of patient's allergies indicates no known allergies.  Home Medications   Prior to Admission medications   Medication Sig Start Date End Date Taking? Authorizing Provider  amLODipine (NORVASC) 10 MG tablet Take 10 mg by mouth daily.   Yes Historical Provider, MD  aspirin EC 81 MG tablet Take 1 tablet (81 mg total) by mouth daily. 05/22/14  Yes Reyne Dumas, MD  atorvastatin (LIPITOR) 40 MG tablet Take 40 mg by mouth daily.   Yes Historical Provider, MD  brimonidine (ALPHAGAN P) 0.1 % SOLN Place 1 drop into both eyes 2 (two) times daily.   Yes Historical Provider, MD  chlorthalidone (HYGROTON) 25 MG tablet Take 25 mg by mouth daily.   Yes Historical Provider, MD  HYDROcodone-acetaminophen (NORCO/VICODIN) 5-325 MG per tablet Take 1-2 tablets by mouth every 6 hours as needed for pain and/or cough. 04/20/15   Doree Kuehne, PA-C  latanoprost (XALATAN) 0.005 % ophthalmic solution Place 1 drop into both eyes at bedtime.  03/26/15  Yes Historical Provider, MD  meclizine (ANTIVERT) 25 MG tablet Take 1 tablet (  25 mg total) by mouth 3 (three) times daily as needed for dizziness. 05/22/14  Yes Reyne Dumas, MD  pioglitazone (ACTOS) 15 MG tablet Take 15 mg by mouth daily.   Yes Historical Provider, MD  potassium chloride SA (K-DUR,KLOR-CON) 20 MEQ tablet Take 20 mEq by mouth daily.    Historical Provider, MD   BP 150/55 mmHg  Pulse 65  Temp(Src) 98.7 F (37.1 C) (Oral)  Resp 15  SpO2 99% Physical Exam  Constitutional: He is oriented to person, place, and time. He appears well-developed and well-nourished. No distress.  HENT:  Head: Normocephalic.  Mouth/Throat: Oropharynx is clear and moist.  Multiple partial  thickness abrasions to face  No hemotympanum, battle signs or raccoon's eyes  No crepitance or tenderness to palpation along the orbital rim.  EOMI intact with no pain or diplopia  No abnormal otorrhea or rhinorrhea. Nasal septum midline.  No intraoral trauma.     Eyes: Conjunctivae and EOM are normal. Pupils are equal, round, and reactive to light.  Neck: Normal range of motion. Neck supple.  No midline C-spine  tenderness to palpation or step-offs appreciated. Patient has full range of motion without pain.  Cardiovascular: Normal rate, regular rhythm and intact distal pulses.   Pulmonary/Chest: Effort normal. No stridor. No respiratory distress. He has no wheezes. He has no rales. He exhibits no tenderness.  Abdominal: Soft. Bowel sounds are normal.  Musculoskeletal: Normal range of motion. He exhibits edema and tenderness.  Right knee with moderate effusion, mild warmth good range of motion but with pain, distally neurovascularly intact. No overlying abrasions or puncture wounds  Neurological: He is alert and oriented to person, place, and time.  Skin:  Thickness abrasion to left hand thumb  Psychiatric: He has a normal mood and affect.  Nursing note and vitals reviewed.   ED Course  Procedures (including critical care time) Labs Review Labs Reviewed  CBC WITH DIFFERENTIAL/PLATELET - Abnormal; Notable for the following:    Hemoglobin 11.5 (*)    HCT 33.7 (*)    All other components within normal limits  BASIC METABOLIC PANEL - Abnormal; Notable for the following:    Potassium 3.1 (*)    Glucose, Bld 134 (*)    BUN 33 (*)    Creatinine, Ser 1.77 (*)    GFR calc non Af Amer 33 (*)    GFR calc Af Amer 38 (*)    All other components within normal limits  TROPONIN I    Imaging Review Dg Knee Complete 4 Views Right  04/20/2015   CLINICAL DATA:  Fall  EXAM: RIGHT KNEE - COMPLETE 4+ VIEW  COMPARISON:  None.  FINDINGS: Four views of the right knee submitted. No acute  fracture or subluxation. There is chondrocalcinosis. Mild narrowing of medial joint compartment. Mild spurring of medial tibial plateau and medial femoral condyle. There is spurring of patella. Large joint effusion. Prepatellar soft tissue swelling.  IMPRESSION: No acute fracture or subluxation. Degenerative changes as described above. Chondrocalcinosis. Large joint effusion. Prepatellar soft tissue swelling.   Electronically Signed   By: Lahoma Crocker M.D.   On: 04/20/2015 19:41     EKG Interpretation   Date/Time:  Wednesday April 20 2015 18:55:08 EDT Ventricular Rate:  70 PR Interval:  149 QRS Duration: 143 QT Interval:  433 QTC Calculation: 467 R Axis:   59 Text Interpretation:  Sinus rhythm Left bundle branch block No significant  change since last tracing Confirmed by ALLEN  MD, ANTHONY ([id number]) on  04/20/2015 8:46:40 PM      MDM   Final diagnoses:  Pain  Knee effusion, right  Fall at home, initial encounter  Hypokalemia    Filed Vitals:   04/20/15 1848 04/20/15 2116  BP: 166/71 150/55  Pulse: 67 65  Temp: 98.3 F (36.8 C) 98.7 F (37.1 C)  TempSrc: Oral Oral  Resp: 21 15  SpO2: 96% 99%    Medications  Tdap (BOOSTRIX) injection 0.5 mL (0.5 mLs Intramuscular Given 04/20/15 2008)  potassium chloride SA (K-DUR,KLOR-CON) CR tablet 40 mEq (40 mEq Oral Given 04/20/15 2240)    [name] is a pleasant 81 y.o. male presenting with possible mechanical fall at home, right knee pain and effusion. Patient is into the tori but with severe pain. Good range of motion and the knee is not terribly warm. I doubt a joint. Patient has declined pain medication. X-ray is without fracture. Blood work shows a hypokalemia 3.1. Patient states he's not taking any potassium supplementation at home. Of advised him to comply with at least 3 days of my potassium supplementation. Patient will be given orthopedic referral, I will write him a prescription for Vicodin and given crutches. We've had an  extensive discussion about fall precautions.  This is a shared visit with the attending physician who personally evaluated the patient and agrees with the care plan.   Evaluation does not show pathology that would require ongoing emergent intervention or inpatient treatment. Pt is hemodynamically stable and mentating appropriately. Discussed findings and plan with patient/guardian, who agrees with care plan. All questions answered. Return precautions discussed and outpatient follow up given.   New Prescriptions   HYDROCODONE-ACETAMINOPHEN (NORCO/VICODIN) 5-325 MG PER TABLET    Take 1-2 tablets by mouth every 6 hours as needed for pain and/or cough.         Monico Blitz, PA-C 04/20/15 2251  Lacretia Leigh, MD 04/21/15 2034

## 2015-04-28 DIAGNOSIS — H4011X2 Primary open-angle glaucoma, moderate stage: Secondary | ICD-10-CM | POA: Diagnosis not present

## 2015-04-28 DIAGNOSIS — H04123 Dry eye syndrome of bilateral lacrimal glands: Secondary | ICD-10-CM | POA: Diagnosis not present

## 2015-04-28 DIAGNOSIS — I10 Essential (primary) hypertension: Secondary | ICD-10-CM | POA: Diagnosis not present

## 2015-05-04 DIAGNOSIS — E118 Type 2 diabetes mellitus with unspecified complications: Secondary | ICD-10-CM | POA: Diagnosis not present

## 2015-05-04 DIAGNOSIS — I10 Essential (primary) hypertension: Secondary | ICD-10-CM | POA: Diagnosis not present

## 2015-05-04 DIAGNOSIS — E876 Hypokalemia: Secondary | ICD-10-CM | POA: Diagnosis not present

## 2015-05-04 DIAGNOSIS — E1169 Type 2 diabetes mellitus with other specified complication: Secondary | ICD-10-CM | POA: Diagnosis not present

## 2015-05-11 DIAGNOSIS — C61 Malignant neoplasm of prostate: Secondary | ICD-10-CM | POA: Diagnosis not present

## 2015-05-27 DIAGNOSIS — C61 Malignant neoplasm of prostate: Secondary | ICD-10-CM | POA: Diagnosis not present

## 2015-06-07 DIAGNOSIS — J441 Chronic obstructive pulmonary disease with (acute) exacerbation: Secondary | ICD-10-CM | POA: Diagnosis not present

## 2015-06-07 DIAGNOSIS — I10 Essential (primary) hypertension: Secondary | ICD-10-CM | POA: Diagnosis not present

## 2015-06-07 DIAGNOSIS — N189 Chronic kidney disease, unspecified: Secondary | ICD-10-CM | POA: Diagnosis not present

## 2015-06-07 DIAGNOSIS — E118 Type 2 diabetes mellitus with unspecified complications: Secondary | ICD-10-CM | POA: Diagnosis not present

## 2015-06-29 DIAGNOSIS — R202 Paresthesia of skin: Secondary | ICD-10-CM | POA: Diagnosis not present

## 2015-08-03 DIAGNOSIS — R202 Paresthesia of skin: Secondary | ICD-10-CM | POA: Diagnosis not present

## 2015-08-03 DIAGNOSIS — Z23 Encounter for immunization: Secondary | ICD-10-CM | POA: Diagnosis not present

## 2015-08-03 DIAGNOSIS — E1169 Type 2 diabetes mellitus with other specified complication: Secondary | ICD-10-CM | POA: Diagnosis not present

## 2015-08-03 DIAGNOSIS — J441 Chronic obstructive pulmonary disease with (acute) exacerbation: Secondary | ICD-10-CM | POA: Diagnosis not present

## 2015-08-22 DIAGNOSIS — C61 Malignant neoplasm of prostate: Secondary | ICD-10-CM | POA: Diagnosis not present

## 2015-08-29 DIAGNOSIS — H04123 Dry eye syndrome of bilateral lacrimal glands: Secondary | ICD-10-CM | POA: Diagnosis not present

## 2015-08-29 DIAGNOSIS — H401132 Primary open-angle glaucoma, bilateral, moderate stage: Secondary | ICD-10-CM | POA: Diagnosis not present

## 2015-08-30 DIAGNOSIS — R3915 Urgency of urination: Secondary | ICD-10-CM | POA: Diagnosis not present

## 2015-08-30 DIAGNOSIS — R972 Elevated prostate specific antigen [PSA]: Secondary | ICD-10-CM | POA: Diagnosis not present

## 2015-11-02 DIAGNOSIS — E1169 Type 2 diabetes mellitus with other specified complication: Secondary | ICD-10-CM | POA: Diagnosis not present

## 2015-11-02 DIAGNOSIS — Z0000 Encounter for general adult medical examination without abnormal findings: Secondary | ICD-10-CM | POA: Diagnosis not present

## 2015-11-28 DIAGNOSIS — R3915 Urgency of urination: Secondary | ICD-10-CM | POA: Diagnosis not present

## 2015-11-29 DIAGNOSIS — H401132 Primary open-angle glaucoma, bilateral, moderate stage: Secondary | ICD-10-CM | POA: Diagnosis not present

## 2015-11-29 DIAGNOSIS — H35342 Macular cyst, hole, or pseudohole, left eye: Secondary | ICD-10-CM | POA: Diagnosis not present

## 2015-11-29 DIAGNOSIS — E119 Type 2 diabetes mellitus without complications: Secondary | ICD-10-CM | POA: Diagnosis not present

## 2015-12-05 DIAGNOSIS — M1990 Unspecified osteoarthritis, unspecified site: Secondary | ICD-10-CM | POA: Diagnosis not present

## 2015-12-05 DIAGNOSIS — R2689 Other abnormalities of gait and mobility: Secondary | ICD-10-CM | POA: Diagnosis not present

## 2015-12-05 DIAGNOSIS — M790 Rheumatism, unspecified: Secondary | ICD-10-CM | POA: Diagnosis not present

## 2015-12-05 DIAGNOSIS — R202 Paresthesia of skin: Secondary | ICD-10-CM | POA: Diagnosis not present

## 2015-12-13 DIAGNOSIS — M790 Rheumatism, unspecified: Secondary | ICD-10-CM | POA: Diagnosis not present

## 2015-12-13 DIAGNOSIS — R202 Paresthesia of skin: Secondary | ICD-10-CM | POA: Diagnosis not present

## 2015-12-13 DIAGNOSIS — R2689 Other abnormalities of gait and mobility: Secondary | ICD-10-CM | POA: Diagnosis not present

## 2015-12-13 DIAGNOSIS — M1990 Unspecified osteoarthritis, unspecified site: Secondary | ICD-10-CM | POA: Diagnosis not present

## 2015-12-14 DIAGNOSIS — E118 Type 2 diabetes mellitus with unspecified complications: Secondary | ICD-10-CM | POA: Diagnosis not present

## 2015-12-14 DIAGNOSIS — J441 Chronic obstructive pulmonary disease with (acute) exacerbation: Secondary | ICD-10-CM | POA: Diagnosis not present

## 2015-12-14 DIAGNOSIS — N189 Chronic kidney disease, unspecified: Secondary | ICD-10-CM | POA: Diagnosis not present

## 2015-12-15 DIAGNOSIS — R2689 Other abnormalities of gait and mobility: Secondary | ICD-10-CM | POA: Diagnosis not present

## 2015-12-15 DIAGNOSIS — M790 Rheumatism, unspecified: Secondary | ICD-10-CM | POA: Diagnosis not present

## 2015-12-15 DIAGNOSIS — R202 Paresthesia of skin: Secondary | ICD-10-CM | POA: Diagnosis not present

## 2015-12-15 DIAGNOSIS — M1990 Unspecified osteoarthritis, unspecified site: Secondary | ICD-10-CM | POA: Diagnosis not present

## 2015-12-20 DIAGNOSIS — R202 Paresthesia of skin: Secondary | ICD-10-CM | POA: Diagnosis not present

## 2015-12-20 DIAGNOSIS — M1990 Unspecified osteoarthritis, unspecified site: Secondary | ICD-10-CM | POA: Diagnosis not present

## 2015-12-20 DIAGNOSIS — M790 Rheumatism, unspecified: Secondary | ICD-10-CM | POA: Diagnosis not present

## 2015-12-20 DIAGNOSIS — R2689 Other abnormalities of gait and mobility: Secondary | ICD-10-CM | POA: Diagnosis not present

## 2015-12-22 DIAGNOSIS — M1990 Unspecified osteoarthritis, unspecified site: Secondary | ICD-10-CM | POA: Diagnosis not present

## 2015-12-22 DIAGNOSIS — M790 Rheumatism, unspecified: Secondary | ICD-10-CM | POA: Diagnosis not present

## 2015-12-22 DIAGNOSIS — R202 Paresthesia of skin: Secondary | ICD-10-CM | POA: Diagnosis not present

## 2015-12-22 DIAGNOSIS — R2689 Other abnormalities of gait and mobility: Secondary | ICD-10-CM | POA: Diagnosis not present

## 2015-12-27 DIAGNOSIS — R2689 Other abnormalities of gait and mobility: Secondary | ICD-10-CM | POA: Diagnosis not present

## 2015-12-27 DIAGNOSIS — M790 Rheumatism, unspecified: Secondary | ICD-10-CM | POA: Diagnosis not present

## 2015-12-27 DIAGNOSIS — M1990 Unspecified osteoarthritis, unspecified site: Secondary | ICD-10-CM | POA: Diagnosis not present

## 2015-12-27 DIAGNOSIS — R202 Paresthesia of skin: Secondary | ICD-10-CM | POA: Diagnosis not present

## 2015-12-29 DIAGNOSIS — R2689 Other abnormalities of gait and mobility: Secondary | ICD-10-CM | POA: Diagnosis not present

## 2015-12-29 DIAGNOSIS — M1990 Unspecified osteoarthritis, unspecified site: Secondary | ICD-10-CM | POA: Diagnosis not present

## 2015-12-29 DIAGNOSIS — M790 Rheumatism, unspecified: Secondary | ICD-10-CM | POA: Diagnosis not present

## 2015-12-29 DIAGNOSIS — R202 Paresthesia of skin: Secondary | ICD-10-CM | POA: Diagnosis not present

## 2016-01-03 DIAGNOSIS — Z8781 Personal history of (healed) traumatic fracture: Secondary | ICD-10-CM | POA: Diagnosis not present

## 2016-01-03 DIAGNOSIS — H9190 Unspecified hearing loss, unspecified ear: Secondary | ICD-10-CM | POA: Diagnosis not present

## 2016-01-03 DIAGNOSIS — R202 Paresthesia of skin: Secondary | ICD-10-CM | POA: Diagnosis not present

## 2016-01-03 DIAGNOSIS — Z85038 Personal history of other malignant neoplasm of large intestine: Secondary | ICD-10-CM | POA: Diagnosis not present

## 2016-01-03 DIAGNOSIS — M1990 Unspecified osteoarthritis, unspecified site: Secondary | ICD-10-CM | POA: Diagnosis not present

## 2016-01-03 DIAGNOSIS — M790 Rheumatism, unspecified: Secondary | ICD-10-CM | POA: Diagnosis not present

## 2016-01-03 DIAGNOSIS — E119 Type 2 diabetes mellitus without complications: Secondary | ICD-10-CM | POA: Diagnosis not present

## 2016-01-03 DIAGNOSIS — R2689 Other abnormalities of gait and mobility: Secondary | ICD-10-CM | POA: Diagnosis not present

## 2016-01-03 DIAGNOSIS — H269 Unspecified cataract: Secondary | ICD-10-CM | POA: Diagnosis not present

## 2016-01-03 DIAGNOSIS — I10 Essential (primary) hypertension: Secondary | ICD-10-CM | POA: Diagnosis not present

## 2016-01-05 DIAGNOSIS — M790 Rheumatism, unspecified: Secondary | ICD-10-CM | POA: Diagnosis not present

## 2016-01-05 DIAGNOSIS — R202 Paresthesia of skin: Secondary | ICD-10-CM | POA: Diagnosis not present

## 2016-01-05 DIAGNOSIS — E119 Type 2 diabetes mellitus without complications: Secondary | ICD-10-CM | POA: Diagnosis not present

## 2016-01-05 DIAGNOSIS — H269 Unspecified cataract: Secondary | ICD-10-CM | POA: Diagnosis not present

## 2016-01-05 DIAGNOSIS — Z8781 Personal history of (healed) traumatic fracture: Secondary | ICD-10-CM | POA: Diagnosis not present

## 2016-01-05 DIAGNOSIS — M1990 Unspecified osteoarthritis, unspecified site: Secondary | ICD-10-CM | POA: Diagnosis not present

## 2016-01-05 DIAGNOSIS — I10 Essential (primary) hypertension: Secondary | ICD-10-CM | POA: Diagnosis not present

## 2016-01-05 DIAGNOSIS — Z85038 Personal history of other malignant neoplasm of large intestine: Secondary | ICD-10-CM | POA: Diagnosis not present

## 2016-01-05 DIAGNOSIS — H9190 Unspecified hearing loss, unspecified ear: Secondary | ICD-10-CM | POA: Diagnosis not present

## 2016-01-05 DIAGNOSIS — R2689 Other abnormalities of gait and mobility: Secondary | ICD-10-CM | POA: Diagnosis not present

## 2016-01-06 DIAGNOSIS — H35342 Macular cyst, hole, or pseudohole, left eye: Secondary | ICD-10-CM | POA: Diagnosis not present

## 2016-01-06 DIAGNOSIS — E119 Type 2 diabetes mellitus without complications: Secondary | ICD-10-CM | POA: Diagnosis not present

## 2016-01-06 DIAGNOSIS — H401132 Primary open-angle glaucoma, bilateral, moderate stage: Secondary | ICD-10-CM | POA: Diagnosis not present

## 2016-01-10 DIAGNOSIS — M1990 Unspecified osteoarthritis, unspecified site: Secondary | ICD-10-CM | POA: Diagnosis not present

## 2016-01-10 DIAGNOSIS — E119 Type 2 diabetes mellitus without complications: Secondary | ICD-10-CM | POA: Diagnosis not present

## 2016-01-10 DIAGNOSIS — H269 Unspecified cataract: Secondary | ICD-10-CM | POA: Diagnosis not present

## 2016-01-10 DIAGNOSIS — R2689 Other abnormalities of gait and mobility: Secondary | ICD-10-CM | POA: Diagnosis not present

## 2016-01-10 DIAGNOSIS — M790 Rheumatism, unspecified: Secondary | ICD-10-CM | POA: Diagnosis not present

## 2016-01-10 DIAGNOSIS — Z8781 Personal history of (healed) traumatic fracture: Secondary | ICD-10-CM | POA: Diagnosis not present

## 2016-01-10 DIAGNOSIS — R202 Paresthesia of skin: Secondary | ICD-10-CM | POA: Diagnosis not present

## 2016-01-10 DIAGNOSIS — H9190 Unspecified hearing loss, unspecified ear: Secondary | ICD-10-CM | POA: Diagnosis not present

## 2016-01-10 DIAGNOSIS — Z85038 Personal history of other malignant neoplasm of large intestine: Secondary | ICD-10-CM | POA: Diagnosis not present

## 2016-01-10 DIAGNOSIS — I10 Essential (primary) hypertension: Secondary | ICD-10-CM | POA: Diagnosis not present

## 2016-01-12 DIAGNOSIS — R2689 Other abnormalities of gait and mobility: Secondary | ICD-10-CM | POA: Diagnosis not present

## 2016-01-12 DIAGNOSIS — H9190 Unspecified hearing loss, unspecified ear: Secondary | ICD-10-CM | POA: Diagnosis not present

## 2016-01-12 DIAGNOSIS — M1990 Unspecified osteoarthritis, unspecified site: Secondary | ICD-10-CM | POA: Diagnosis not present

## 2016-01-12 DIAGNOSIS — Z85038 Personal history of other malignant neoplasm of large intestine: Secondary | ICD-10-CM | POA: Diagnosis not present

## 2016-01-12 DIAGNOSIS — H269 Unspecified cataract: Secondary | ICD-10-CM | POA: Diagnosis not present

## 2016-01-12 DIAGNOSIS — R202 Paresthesia of skin: Secondary | ICD-10-CM | POA: Diagnosis not present

## 2016-01-12 DIAGNOSIS — E119 Type 2 diabetes mellitus without complications: Secondary | ICD-10-CM | POA: Diagnosis not present

## 2016-01-12 DIAGNOSIS — Z8781 Personal history of (healed) traumatic fracture: Secondary | ICD-10-CM | POA: Diagnosis not present

## 2016-01-12 DIAGNOSIS — M790 Rheumatism, unspecified: Secondary | ICD-10-CM | POA: Diagnosis not present

## 2016-01-12 DIAGNOSIS — I10 Essential (primary) hypertension: Secondary | ICD-10-CM | POA: Diagnosis not present

## 2016-01-16 DIAGNOSIS — Z0000 Encounter for general adult medical examination without abnormal findings: Secondary | ICD-10-CM | POA: Diagnosis not present

## 2016-01-16 DIAGNOSIS — R972 Elevated prostate specific antigen [PSA]: Secondary | ICD-10-CM | POA: Diagnosis not present

## 2016-01-16 DIAGNOSIS — C61 Malignant neoplasm of prostate: Secondary | ICD-10-CM | POA: Diagnosis not present

## 2016-01-17 DIAGNOSIS — R2689 Other abnormalities of gait and mobility: Secondary | ICD-10-CM | POA: Diagnosis not present

## 2016-01-17 DIAGNOSIS — H269 Unspecified cataract: Secondary | ICD-10-CM | POA: Diagnosis not present

## 2016-01-17 DIAGNOSIS — E119 Type 2 diabetes mellitus without complications: Secondary | ICD-10-CM | POA: Diagnosis not present

## 2016-01-17 DIAGNOSIS — R202 Paresthesia of skin: Secondary | ICD-10-CM | POA: Diagnosis not present

## 2016-01-17 DIAGNOSIS — H9190 Unspecified hearing loss, unspecified ear: Secondary | ICD-10-CM | POA: Diagnosis not present

## 2016-01-17 DIAGNOSIS — Z85038 Personal history of other malignant neoplasm of large intestine: Secondary | ICD-10-CM | POA: Diagnosis not present

## 2016-01-17 DIAGNOSIS — Z8781 Personal history of (healed) traumatic fracture: Secondary | ICD-10-CM | POA: Diagnosis not present

## 2016-01-17 DIAGNOSIS — M1990 Unspecified osteoarthritis, unspecified site: Secondary | ICD-10-CM | POA: Diagnosis not present

## 2016-01-17 DIAGNOSIS — M790 Rheumatism, unspecified: Secondary | ICD-10-CM | POA: Diagnosis not present

## 2016-01-17 DIAGNOSIS — I10 Essential (primary) hypertension: Secondary | ICD-10-CM | POA: Diagnosis not present

## 2016-01-18 DIAGNOSIS — Z8781 Personal history of (healed) traumatic fracture: Secondary | ICD-10-CM | POA: Diagnosis not present

## 2016-01-18 DIAGNOSIS — R2689 Other abnormalities of gait and mobility: Secondary | ICD-10-CM | POA: Diagnosis not present

## 2016-01-18 DIAGNOSIS — E119 Type 2 diabetes mellitus without complications: Secondary | ICD-10-CM | POA: Diagnosis not present

## 2016-01-18 DIAGNOSIS — R202 Paresthesia of skin: Secondary | ICD-10-CM | POA: Diagnosis not present

## 2016-01-18 DIAGNOSIS — I10 Essential (primary) hypertension: Secondary | ICD-10-CM | POA: Diagnosis not present

## 2016-01-18 DIAGNOSIS — M1990 Unspecified osteoarthritis, unspecified site: Secondary | ICD-10-CM | POA: Diagnosis not present

## 2016-01-18 DIAGNOSIS — M790 Rheumatism, unspecified: Secondary | ICD-10-CM | POA: Diagnosis not present

## 2016-01-18 DIAGNOSIS — H269 Unspecified cataract: Secondary | ICD-10-CM | POA: Diagnosis not present

## 2016-01-18 DIAGNOSIS — Z85038 Personal history of other malignant neoplasm of large intestine: Secondary | ICD-10-CM | POA: Diagnosis not present

## 2016-01-18 DIAGNOSIS — H9190 Unspecified hearing loss, unspecified ear: Secondary | ICD-10-CM | POA: Diagnosis not present

## 2016-01-19 DIAGNOSIS — Z8781 Personal history of (healed) traumatic fracture: Secondary | ICD-10-CM | POA: Diagnosis not present

## 2016-01-19 DIAGNOSIS — M1990 Unspecified osteoarthritis, unspecified site: Secondary | ICD-10-CM | POA: Diagnosis not present

## 2016-01-19 DIAGNOSIS — E119 Type 2 diabetes mellitus without complications: Secondary | ICD-10-CM | POA: Diagnosis not present

## 2016-01-19 DIAGNOSIS — R2689 Other abnormalities of gait and mobility: Secondary | ICD-10-CM | POA: Diagnosis not present

## 2016-01-19 DIAGNOSIS — H9190 Unspecified hearing loss, unspecified ear: Secondary | ICD-10-CM | POA: Diagnosis not present

## 2016-01-19 DIAGNOSIS — I10 Essential (primary) hypertension: Secondary | ICD-10-CM | POA: Diagnosis not present

## 2016-01-19 DIAGNOSIS — M790 Rheumatism, unspecified: Secondary | ICD-10-CM | POA: Diagnosis not present

## 2016-01-19 DIAGNOSIS — Z85038 Personal history of other malignant neoplasm of large intestine: Secondary | ICD-10-CM | POA: Diagnosis not present

## 2016-01-19 DIAGNOSIS — R202 Paresthesia of skin: Secondary | ICD-10-CM | POA: Diagnosis not present

## 2016-01-19 DIAGNOSIS — H269 Unspecified cataract: Secondary | ICD-10-CM | POA: Diagnosis not present

## 2016-01-20 DIAGNOSIS — Z85038 Personal history of other malignant neoplasm of large intestine: Secondary | ICD-10-CM | POA: Diagnosis not present

## 2016-01-20 DIAGNOSIS — I10 Essential (primary) hypertension: Secondary | ICD-10-CM | POA: Diagnosis not present

## 2016-01-20 DIAGNOSIS — M790 Rheumatism, unspecified: Secondary | ICD-10-CM | POA: Diagnosis not present

## 2016-01-20 DIAGNOSIS — H269 Unspecified cataract: Secondary | ICD-10-CM | POA: Diagnosis not present

## 2016-01-20 DIAGNOSIS — M1990 Unspecified osteoarthritis, unspecified site: Secondary | ICD-10-CM | POA: Diagnosis not present

## 2016-01-20 DIAGNOSIS — E119 Type 2 diabetes mellitus without complications: Secondary | ICD-10-CM | POA: Diagnosis not present

## 2016-01-20 DIAGNOSIS — Z8781 Personal history of (healed) traumatic fracture: Secondary | ICD-10-CM | POA: Diagnosis not present

## 2016-01-20 DIAGNOSIS — R2689 Other abnormalities of gait and mobility: Secondary | ICD-10-CM | POA: Diagnosis not present

## 2016-01-20 DIAGNOSIS — R202 Paresthesia of skin: Secondary | ICD-10-CM | POA: Diagnosis not present

## 2016-01-20 DIAGNOSIS — H9190 Unspecified hearing loss, unspecified ear: Secondary | ICD-10-CM | POA: Diagnosis not present

## 2016-01-24 DIAGNOSIS — R202 Paresthesia of skin: Secondary | ICD-10-CM | POA: Diagnosis not present

## 2016-01-24 DIAGNOSIS — M790 Rheumatism, unspecified: Secondary | ICD-10-CM | POA: Diagnosis not present

## 2016-01-24 DIAGNOSIS — Z8781 Personal history of (healed) traumatic fracture: Secondary | ICD-10-CM | POA: Diagnosis not present

## 2016-01-24 DIAGNOSIS — H9190 Unspecified hearing loss, unspecified ear: Secondary | ICD-10-CM | POA: Diagnosis not present

## 2016-01-24 DIAGNOSIS — Z85038 Personal history of other malignant neoplasm of large intestine: Secondary | ICD-10-CM | POA: Diagnosis not present

## 2016-01-24 DIAGNOSIS — I10 Essential (primary) hypertension: Secondary | ICD-10-CM | POA: Diagnosis not present

## 2016-01-24 DIAGNOSIS — H269 Unspecified cataract: Secondary | ICD-10-CM | POA: Diagnosis not present

## 2016-01-24 DIAGNOSIS — E119 Type 2 diabetes mellitus without complications: Secondary | ICD-10-CM | POA: Diagnosis not present

## 2016-01-24 DIAGNOSIS — M1990 Unspecified osteoarthritis, unspecified site: Secondary | ICD-10-CM | POA: Diagnosis not present

## 2016-01-24 DIAGNOSIS — R2689 Other abnormalities of gait and mobility: Secondary | ICD-10-CM | POA: Diagnosis not present

## 2016-01-25 DIAGNOSIS — I10 Essential (primary) hypertension: Secondary | ICD-10-CM | POA: Diagnosis not present

## 2016-01-25 DIAGNOSIS — N189 Chronic kidney disease, unspecified: Secondary | ICD-10-CM | POA: Diagnosis not present

## 2016-01-25 DIAGNOSIS — J441 Chronic obstructive pulmonary disease with (acute) exacerbation: Secondary | ICD-10-CM | POA: Diagnosis not present

## 2016-01-25 DIAGNOSIS — E118 Type 2 diabetes mellitus with unspecified complications: Secondary | ICD-10-CM | POA: Diagnosis not present

## 2016-01-26 DIAGNOSIS — M790 Rheumatism, unspecified: Secondary | ICD-10-CM | POA: Diagnosis not present

## 2016-01-26 DIAGNOSIS — Z85038 Personal history of other malignant neoplasm of large intestine: Secondary | ICD-10-CM | POA: Diagnosis not present

## 2016-01-26 DIAGNOSIS — R2689 Other abnormalities of gait and mobility: Secondary | ICD-10-CM | POA: Diagnosis not present

## 2016-01-26 DIAGNOSIS — H269 Unspecified cataract: Secondary | ICD-10-CM | POA: Diagnosis not present

## 2016-01-26 DIAGNOSIS — E119 Type 2 diabetes mellitus without complications: Secondary | ICD-10-CM | POA: Diagnosis not present

## 2016-01-26 DIAGNOSIS — M1990 Unspecified osteoarthritis, unspecified site: Secondary | ICD-10-CM | POA: Diagnosis not present

## 2016-01-26 DIAGNOSIS — I10 Essential (primary) hypertension: Secondary | ICD-10-CM | POA: Diagnosis not present

## 2016-01-26 DIAGNOSIS — R202 Paresthesia of skin: Secondary | ICD-10-CM | POA: Diagnosis not present

## 2016-01-26 DIAGNOSIS — Z8781 Personal history of (healed) traumatic fracture: Secondary | ICD-10-CM | POA: Diagnosis not present

## 2016-01-26 DIAGNOSIS — H9190 Unspecified hearing loss, unspecified ear: Secondary | ICD-10-CM | POA: Diagnosis not present

## 2016-02-10 DIAGNOSIS — H35342 Macular cyst, hole, or pseudohole, left eye: Secondary | ICD-10-CM | POA: Diagnosis not present

## 2016-02-10 DIAGNOSIS — H04123 Dry eye syndrome of bilateral lacrimal glands: Secondary | ICD-10-CM | POA: Diagnosis not present

## 2016-02-10 DIAGNOSIS — H401133 Primary open-angle glaucoma, bilateral, severe stage: Secondary | ICD-10-CM | POA: Diagnosis not present

## 2016-03-21 ENCOUNTER — Encounter: Payer: Self-pay | Admitting: Internal Medicine

## 2016-03-21 DIAGNOSIS — R972 Elevated prostate specific antigen [PSA]: Secondary | ICD-10-CM | POA: Diagnosis not present

## 2016-03-21 DIAGNOSIS — C61 Malignant neoplasm of prostate: Secondary | ICD-10-CM | POA: Diagnosis not present

## 2016-03-27 DIAGNOSIS — E1169 Type 2 diabetes mellitus with other specified complication: Secondary | ICD-10-CM | POA: Diagnosis not present

## 2016-03-27 DIAGNOSIS — I10 Essential (primary) hypertension: Secondary | ICD-10-CM | POA: Diagnosis not present

## 2016-03-27 DIAGNOSIS — G5603 Carpal tunnel syndrome, bilateral upper limbs: Secondary | ICD-10-CM | POA: Diagnosis not present

## 2016-04-11 DIAGNOSIS — G5683 Other specified mononeuropathies of bilateral upper limbs: Secondary | ICD-10-CM | POA: Diagnosis not present

## 2016-04-11 DIAGNOSIS — E118 Type 2 diabetes mellitus with unspecified complications: Secondary | ICD-10-CM | POA: Diagnosis not present

## 2016-04-11 DIAGNOSIS — N189 Chronic kidney disease, unspecified: Secondary | ICD-10-CM | POA: Diagnosis not present

## 2016-04-11 DIAGNOSIS — I10 Essential (primary) hypertension: Secondary | ICD-10-CM | POA: Diagnosis not present

## 2016-04-23 DIAGNOSIS — N401 Enlarged prostate with lower urinary tract symptoms: Secondary | ICD-10-CM | POA: Diagnosis not present

## 2016-04-23 DIAGNOSIS — R350 Frequency of micturition: Secondary | ICD-10-CM | POA: Diagnosis not present

## 2016-04-23 DIAGNOSIS — C61 Malignant neoplasm of prostate: Secondary | ICD-10-CM | POA: Diagnosis not present

## 2016-05-02 DIAGNOSIS — E1169 Type 2 diabetes mellitus with other specified complication: Secondary | ICD-10-CM | POA: Diagnosis not present

## 2016-05-02 DIAGNOSIS — R202 Paresthesia of skin: Secondary | ICD-10-CM | POA: Diagnosis not present

## 2016-05-02 DIAGNOSIS — I10 Essential (primary) hypertension: Secondary | ICD-10-CM | POA: Diagnosis not present

## 2016-05-26 ENCOUNTER — Emergency Department (HOSPITAL_COMMUNITY)
Admission: EM | Admit: 2016-05-26 | Discharge: 2016-05-26 | Disposition: A | Discharge status: Home / Self Care | Payer: Medicare Other | Attending: Physician Assistant | Admitting: Physician Assistant

## 2016-05-26 ENCOUNTER — Emergency Department (HOSPITAL_COMMUNITY): Payer: Medicare Other

## 2016-05-26 ENCOUNTER — Encounter (HOSPITAL_COMMUNITY): Payer: Self-pay

## 2016-05-26 DIAGNOSIS — Z79899 Other long term (current) drug therapy: Secondary | ICD-10-CM | POA: Insufficient documentation

## 2016-05-26 DIAGNOSIS — R296 Repeated falls: Secondary | ICD-10-CM | POA: Insufficient documentation

## 2016-05-26 DIAGNOSIS — Z85038 Personal history of other malignant neoplasm of large intestine: Secondary | ICD-10-CM | POA: Insufficient documentation

## 2016-05-26 DIAGNOSIS — Z7982 Long term (current) use of aspirin: Secondary | ICD-10-CM | POA: Insufficient documentation

## 2016-05-26 DIAGNOSIS — R42 Dizziness and giddiness: Secondary | ICD-10-CM | POA: Diagnosis not present

## 2016-05-26 DIAGNOSIS — Z8546 Personal history of malignant neoplasm of prostate: Secondary | ICD-10-CM | POA: Diagnosis not present

## 2016-05-26 DIAGNOSIS — E119 Type 2 diabetes mellitus without complications: Secondary | ICD-10-CM | POA: Insufficient documentation

## 2016-05-26 DIAGNOSIS — Z87891 Personal history of nicotine dependence: Secondary | ICD-10-CM | POA: Diagnosis not present

## 2016-05-26 DIAGNOSIS — I10 Essential (primary) hypertension: Secondary | ICD-10-CM | POA: Diagnosis not present

## 2016-05-26 HISTORY — DX: Repeated falls: R29.6

## 2016-05-26 LAB — CBC WITH DIFFERENTIAL/PLATELET
BASOS PCT: 0 %
Basophils Absolute: 0 10*3/uL (ref 0.0–0.1)
Eosinophils Absolute: 0.2 10*3/uL (ref 0.0–0.7)
Eosinophils Relative: 5 %
HEMATOCRIT: 34.9 % — AB (ref 39.0–52.0)
Hemoglobin: 11.7 g/dL — ABNORMAL LOW (ref 13.0–17.0)
LYMPHS ABS: 1.3 10*3/uL (ref 0.7–4.0)
Lymphocytes Relative: 29 %
MCH: 27.6 pg (ref 26.0–34.0)
MCHC: 33.5 g/dL (ref 30.0–36.0)
MCV: 82.3 fL (ref 78.0–100.0)
MONO ABS: 0.2 10*3/uL (ref 0.1–1.0)
MONOS PCT: 5 %
NEUTROS ABS: 2.8 10*3/uL (ref 1.7–7.7)
Neutrophils Relative %: 61 %
Platelets: 203 10*3/uL (ref 150–400)
RBC: 4.24 MIL/uL (ref 4.22–5.81)
RDW: 15.6 % — AB (ref 11.5–15.5)
WBC: 4.6 10*3/uL (ref 4.0–10.5)

## 2016-05-26 LAB — COMPREHENSIVE METABOLIC PANEL
ALT: 12 U/L — ABNORMAL LOW (ref 17–63)
ANION GAP: 6 (ref 5–15)
AST: 24 U/L (ref 15–41)
Albumin: 3.8 g/dL (ref 3.5–5.0)
Alkaline Phosphatase: 60 U/L (ref 38–126)
BILIRUBIN TOTAL: 0.6 mg/dL (ref 0.3–1.2)
BUN: 57 mg/dL — ABNORMAL HIGH (ref 6–20)
CO2: 27 mmol/L (ref 22–32)
Calcium: 9.4 mg/dL (ref 8.9–10.3)
Chloride: 107 mmol/L (ref 101–111)
Creatinine, Ser: 1.79 mg/dL — ABNORMAL HIGH (ref 0.61–1.24)
GFR, EST AFRICAN AMERICAN: 37 mL/min — AB (ref >60)
GFR, EST NON AFRICAN AMERICAN: 32 mL/min — AB (ref >60)
GLUCOSE: 93 mg/dL (ref 65–99)
POTASSIUM: 3.8 mmol/L (ref 3.5–5.1)
Sodium: 140 mmol/L (ref 135–145)
TOTAL PROTEIN: 7.6 g/dL (ref 6.5–8.1)

## 2016-05-26 LAB — URINALYSIS, ROUTINE W REFLEX MICROSCOPIC
Bilirubin Urine: NEGATIVE
Glucose, UA: NEGATIVE mg/dL
KETONES UR: NEGATIVE mg/dL
Leukocytes, UA: NEGATIVE
NITRITE: NEGATIVE
PROTEIN: NEGATIVE mg/dL
Specific Gravity, Urine: 1.012 (ref 1.005–1.030)
pH: 7 (ref 5.0–8.0)

## 2016-05-26 LAB — I-STAT TROPONIN, ED: TROPONIN I, POC: 0 ng/mL (ref 0.00–0.08)

## 2016-05-26 LAB — URINE MICROSCOPIC-ADD ON

## 2016-05-26 NOTE — ED Triage Notes (Signed)
He reports that he felt "the room started spinning" this morning.  EMS state pt. Has hx of vertigo.  He arrives awake, alert and in no distress.  He further tells me that "I just got to where I couldn't sit up-I kept falling sideways".  No focal neuro deficits seen on exam.

## 2016-05-26 NOTE — Discharge Instructions (Signed)
Please return with any concerns. °

## 2016-05-26 NOTE — ED Provider Notes (Signed)
[facility] DEPT Provider Note   CSN: [id number] Arrival date & time: 05/26/16  1053  First Provider Contact:  None       History   Chief Complaint Chief Complaint  Patient presents with  . Dizziness    HPI [name] is a 82 y.o. male.  HPI   Pt is a very pleasant 82 year old male with history of DM, HTN, frequent falls presenting today with presycnope. Patietn was sitting in his chair and went to get up, as he was standing he felt lightheaded and had to sit back down. He moaned, which alarmed wife who called EMS.   He has otherwise been in his usual state of health. No cough, no fever, no swelling.  He now feels back to [place]. (after sitting) No urinary complaints.   Past Medical History:  Diagnosis Date  . Adenomatous polyps 09/09/2008  . Arthritis   . Colon cancer ([place]) dx'd 2008   surg only  . Diabetes mellitus without complication ([place])   . Falls frequently   . Hypertension   . Prostate cancer ([place]) dx'd 1993   surg only    Patient Active Problem List   Diagnosis Date Noted  . Falls frequently   . Vertigo 05/20/2014  . PROSTATE CANCER 08/19/2008  . DM 08/19/2008  . HYPERLIPIDEMIA 08/19/2008  . HYPERTENSION 08/19/2008  . Personal history of colon cancer, stage II 08/19/2008    Past Surgical History:  Procedure Laterality Date  . CATARACT EXTRACTION W/PHACO Left 06/10/2013   Procedure: CATARACT EXTRACTION PHACO AND INTRAOCULAR LENS PLACEMENT (IOC);  Surgeon: Adonis Brook, MD;  Location: [place];  Service: Ophthalmology;  Laterality: Left;  . COLONOSCOPY    . ESOPHAGOGASTRODUODENOSCOPY    . EYE SURGERY     cataract surgery, right eye  . laparoscopic assisted right hemicolectomy  09/21/07       Home Medications    Prior to Admission medications   Medication Sig Start Date End Date Taking? Authorizing Provider  amLODipine (NORVASC) 10 MG tablet Take 10 mg by mouth daily.    Historical Provider, MD  aspirin EC 81 MG tablet Take 1 tablet (81 mg  total) by mouth daily. 05/22/14   Reyne Dumas, MD  atorvastatin (LIPITOR) 40 MG tablet Take 40 mg by mouth daily.    Historical Provider, MD  brimonidine (ALPHAGAN P) 0.1 % SOLN Place 1 drop into both eyes 2 (two) times daily.    Historical Provider, MD  chlorthalidone (HYGROTON) 25 MG tablet Take 25 mg by mouth daily.    Historical Provider, MD  HYDROcodone-acetaminophen (NORCO/VICODIN) 5-325 MG per tablet Take 1-2 tablets by mouth every 6 hours as needed for pain and/or cough. 04/20/15   Nicole Pisciotta, PA-C  latanoprost (XALATAN) 0.005 % ophthalmic solution Place 1 drop into both eyes at bedtime.  03/26/15   Historical Provider, MD  meclizine (ANTIVERT) 25 MG tablet Take 1 tablet (25 mg total) by mouth 3 (three) times daily as needed for dizziness. 05/22/14   Reyne Dumas, MD  pioglitazone (ACTOS) 15 MG tablet Take 15 mg by mouth daily.    Historical Provider, MD  potassium chloride SA (K-DUR,KLOR-CON) 20 MEQ tablet Take 1 tablet (20 mEq total) by mouth daily. 04/20/15   Elmyra Ricks Pisciotta, PA-C    Family History Family History  Problem Relation Age of Onset  . Diabetes      Social History Social History  Substance Use Topics  . Smoking status: Former Smoker    Years: 1.00  Types: Cigarettes    Quit date: 06/10/1983  . Smokeless tobacco: Never Used  . Alcohol use Yes     Comment: occasional wine     Allergies   Review of patient's allergies indicates no known allergies.   Review of Systems Review of Systems  Constitutional: Negative for activity change, fatigue and fever.  HENT: Negative for congestion.   Respiratory: Negative for shortness of breath.   Cardiovascular: Negative for chest pain.  Gastrointestinal: Negative for abdominal pain.  Genitourinary: Positive for urgency.  Neurological: Positive for light-headedness.  All other systems reviewed and are negative.    Physical Exam Updated Vital Signs BP 145/61   Pulse (!) 54   Temp 98.1 F (36.7 C) (Oral)   Resp  14   SpO2 100%   Physical Exam  Constitutional: He appears well-developed and well-nourished.  HENT:  Head: Normocephalic and atraumatic.  Eyes: Conjunctivae are normal.  Chronic swelling to R eye, schronic tearing  Neck: Neck supple.  Cardiovascular: Normal rate and regular rhythm.   No murmur heard. Pulmonary/Chest: Effort normal and breath sounds normal. No respiratory distress.  Abdominal: Soft. There is no tenderness.  Musculoskeletal: He exhibits edema.  +1 edema bilaterally  Neurological: He is alert. No cranial nerve deficit. Coordination normal.  Equal strength bilaterally upper and lower extremities negative pronator drift. Normal sensation bilaterally. Speech comprehensible, no slurring. Facial nerve tested and appears grossly normal. Alert and oriented 3.   Skin: Skin is warm and dry.  Psychiatric: He has a normal mood and affect.  Nursing note and vitals reviewed.    ED Treatments / Results  Labs (all labs ordered are listed, but only abnormal results are displayed) Labs Reviewed  CBC WITH DIFFERENTIAL/PLATELET - Abnormal; Notable for the following:       Result Value   Hemoglobin 11.7 (*)    HCT 34.9 (*)    RDW 15.6 (*)    All other components within normal limits  COMPREHENSIVE METABOLIC PANEL - Abnormal; Notable for the following:    BUN 57 (*)    Creatinine, Ser 1.79 (*)    ALT 12 (*)    GFR calc non Af Amer 32 (*)    GFR calc Af Amer 37 (*)    All other components within normal limits  URINALYSIS, ROUTINE W REFLEX MICROSCOPIC (NOT AT [facility]) - Abnormal; Notable for the following:    APPearance CLOUDY (*)    Hgb urine dipstick TRACE (*)    All other components within normal limits  URINE MICROSCOPIC-ADD ON - Abnormal; Notable for the following:    Squamous Epithelial / LPF 0-5 (*)    Bacteria, UA RARE (*)    All other components within normal limits  URINE CULTURE  I-STAT TROPOININ, ED    EKG  EKG Interpretation  Date/Time:  Saturday May 26 2016 11:08:43 EDT Ventricular Rate:  61 PR Interval:    QRS Duration: 136 QT Interval:  485 QTC Calculation: 489 R Axis:   35 Text Interpretation:  Sinus rhythm Left bundle branch block T wave abnormality No significant change since last tracing Abnormal ekg Confirmed by Carmin Muskrat  MD ([id number]) on 05/26/2016 11:20:54 AM Also confirmed by Carmin Muskrat  MD ([place]), editor [place], Joelene Millin [phone])  on 05/26/2016 12:22:46 PM       Radiology Dg Chest 2 View  Result Date: 05/26/2016 CLINICAL DATA:  Status post fall.  Vertigo. EXAM: CHEST  2 VIEW COMPARISON:  May 20, 2014 FINDINGS: The heart, hila, mediastinum,  lungs, and pleura are unremarkable given the low volume technique. IMPRESSION: No active cardiopulmonary disease. Electronically Signed   By: Dorise Bullion III M.D   On: 05/26/2016 13:12    Procedures Procedures (including critical care time)  Medications Ordered in ED Medications - No data to display   Initial Impression / Assessment and Plan / ED Course  I have reviewed the triage vital signs and the nursing notes.  Pertinent labs & imaging results that were available during my care of the patient were reviewed by me and considered in my medical decision making (see chart for details).  Clinical Course    82 year old male with history of DM, HTN, frequent falls presenting today with presycnope. Sounds that patietn got up too quickly, had prodromal symtpoms. No true syncope. Patietn himself says "soemtimes this happens, I get low blood presseure".  Physcial exam reassuring.  Likely orthostatic presycnops. Will do lab work and plan to [place].   2:21 PM Appears very well. Normal labs. Ambualtory like baseline.   Final Clinical Impressions(s) / ED Diagnoses   Final diagnoses:  Falls frequently    New Prescriptions New Prescriptions   No medications on file     Meka Lewan Julio Alm, MD 05/26/16 1421

## 2016-05-26 NOTE — ED Notes (Signed)
Bed: WA09 Expected date:  Expected time:  Means of arrival:  Comments: Getting dressed

## 2016-05-27 LAB — URINE CULTURE

## 2016-05-30 ENCOUNTER — Encounter: Payer: Self-pay | Admitting: Neurology

## 2016-07-04 DIAGNOSIS — N189 Chronic kidney disease, unspecified: Secondary | ICD-10-CM | POA: Diagnosis not present

## 2016-07-04 DIAGNOSIS — I10 Essential (primary) hypertension: Secondary | ICD-10-CM | POA: Diagnosis not present

## 2016-07-04 DIAGNOSIS — E1169 Type 2 diabetes mellitus with other specified complication: Secondary | ICD-10-CM | POA: Diagnosis not present

## 2016-07-23 DIAGNOSIS — C61 Malignant neoplasm of prostate: Secondary | ICD-10-CM | POA: Diagnosis not present

## 2016-07-23 DIAGNOSIS — Z23 Encounter for immunization: Secondary | ICD-10-CM | POA: Diagnosis not present

## 2016-07-30 DIAGNOSIS — N401 Enlarged prostate with lower urinary tract symptoms: Secondary | ICD-10-CM | POA: Diagnosis not present

## 2016-07-30 DIAGNOSIS — C61 Malignant neoplasm of prostate: Secondary | ICD-10-CM | POA: Diagnosis not present

## 2016-07-31 ENCOUNTER — Encounter: Payer: Self-pay | Admitting: Neurology

## 2016-09-12 ENCOUNTER — Encounter: Payer: Self-pay | Admitting: Neurology

## 2016-09-19 DIAGNOSIS — G5603 Carpal tunnel syndrome, bilateral upper limbs: Secondary | ICD-10-CM | POA: Diagnosis not present

## 2016-09-19 DIAGNOSIS — N189 Chronic kidney disease, unspecified: Secondary | ICD-10-CM | POA: Diagnosis not present

## 2016-09-19 DIAGNOSIS — J441 Chronic obstructive pulmonary disease with (acute) exacerbation: Secondary | ICD-10-CM | POA: Diagnosis not present

## 2016-09-19 DIAGNOSIS — I10 Essential (primary) hypertension: Secondary | ICD-10-CM | POA: Diagnosis not present

## 2016-09-19 DIAGNOSIS — E1169 Type 2 diabetes mellitus with other specified complication: Secondary | ICD-10-CM | POA: Diagnosis not present

## 2016-10-08 ENCOUNTER — Encounter: Payer: Self-pay | Admitting: Neurology

## 2016-10-25 DIAGNOSIS — I10 Essential (primary) hypertension: Secondary | ICD-10-CM | POA: Diagnosis not present

## 2016-10-25 DIAGNOSIS — E1169 Type 2 diabetes mellitus with other specified complication: Secondary | ICD-10-CM | POA: Diagnosis not present

## 2016-10-25 DIAGNOSIS — N189 Chronic kidney disease, unspecified: Secondary | ICD-10-CM | POA: Diagnosis not present

## 2016-10-25 DIAGNOSIS — M130 Polyarthritis, unspecified: Secondary | ICD-10-CM | POA: Diagnosis not present

## 2016-11-05 DIAGNOSIS — C61 Malignant neoplasm of prostate: Secondary | ICD-10-CM | POA: Diagnosis not present

## 2016-11-12 ENCOUNTER — Encounter: Payer: Self-pay | Admitting: Neurology

## 2016-11-12 DIAGNOSIS — C61 Malignant neoplasm of prostate: Secondary | ICD-10-CM | POA: Diagnosis not present

## 2016-11-21 ENCOUNTER — Encounter: Payer: Self-pay | Admitting: Neurology

## 2016-11-21 ENCOUNTER — Ambulatory Visit (INDEPENDENT_AMBULATORY_CARE_PROVIDER_SITE_OTHER): Payer: Self-pay | Admitting: Neurology

## 2016-11-21 ENCOUNTER — Ambulatory Visit (INDEPENDENT_AMBULATORY_CARE_PROVIDER_SITE_OTHER): Payer: Medicare Other | Admitting: Neurology

## 2016-11-21 DIAGNOSIS — G5603 Carpal tunnel syndrome, bilateral upper limbs: Secondary | ICD-10-CM | POA: Diagnosis not present

## 2016-11-21 DIAGNOSIS — Z0289 Encounter for other administrative examinations: Secondary | ICD-10-CM

## 2016-11-21 HISTORY — DX: Carpal tunnel syndrome, bilateral upper limbs: G56.03

## 2016-11-21 NOTE — Procedures (Signed)
     HISTORY:  [name] is a 83 year old gentleman with a one-year history of discomfort and numbness in both hands, with some achy pain radiating to the elbows and shoulders bilaterally. The symptoms are bit worse on the right than the left. He does have a history of diabetes. The patient is being evaluated for a possible neuropathy.  NERVE CONDUCTION STUDIES:  Nerve conduction studies were performed on both upper extremities. The distal motor latencies for the median nerves were prolonged bilaterally with normal motor amplitudes for these nerves bilaterally. The distal motor latencies and motor amplitudes for the ulnar nerves were normal bilaterally. The F wave latencies for the median nerves were prolonged bilaterally, normal for the ulnar nerves bilaterally. The nerve conduction velocities for the median and ulnar nerves were normal bilaterally. The sensory latencies for the median nerves were prolonged bilaterally, normal for the ulnar nerves bilaterally.  EMG STUDIES:  EMG study was performed on the right upper extremity:  The first dorsal interosseous muscle reveals 2 to 4 K units with full recruitment. No fibrillations or positive waves were noted. The abductor pollicis brevis muscle reveals 2 to 4 K units with full recruitment. No fibrillations or positive waves were noted. The extensor indicis proprius muscle reveals 1 to 3 K units with full recruitment. No fibrillations or positive waves were noted. The pronator teres muscle reveals 2 to 3 K units with full recruitment. No fibrillations or positive waves were noted. The biceps muscle reveals 1 to 2 K units with full recruitment. No fibrillations or positive waves were noted. The triceps muscle reveals 2 to 4 K units with full recruitment. No fibrillations or positive waves were noted. The anterior deltoid muscle reveals 2 to 3 K units with full recruitment. No fibrillations or positive waves were noted. The cervical paraspinal  muscles were tested at 2 levels. No abnormalities of insertional activity were seen at either level tested. There was good relaxation.   IMPRESSION:  Nerve conduction studies done on both upper extremities shows evidence of mild bilateral carpal tunnel syndrome. EMG evaluation of the right upper extremity was unremarkable, without evidence of an overlying cervical radiculopathy.  Jill Alexanders MD 11/21/2016 1:54 PM  [facility] [address] [place], [place] [address]  Phone [phone] Fax [phone]

## 2016-11-21 NOTE — Progress Notes (Signed)
Please refer to EMG and nerve conduction study procedure note. 

## 2016-12-07 DIAGNOSIS — M130 Polyarthritis, unspecified: Secondary | ICD-10-CM | POA: Diagnosis not present

## 2016-12-07 DIAGNOSIS — N189 Chronic kidney disease, unspecified: Secondary | ICD-10-CM | POA: Diagnosis not present

## 2016-12-07 DIAGNOSIS — I10 Essential (primary) hypertension: Secondary | ICD-10-CM | POA: Diagnosis not present

## 2016-12-07 DIAGNOSIS — R600 Localized edema: Secondary | ICD-10-CM | POA: Diagnosis not present

## 2016-12-07 DIAGNOSIS — Z0000 Encounter for general adult medical examination without abnormal findings: Secondary | ICD-10-CM | POA: Diagnosis not present

## 2016-12-07 DIAGNOSIS — J441 Chronic obstructive pulmonary disease with (acute) exacerbation: Secondary | ICD-10-CM | POA: Diagnosis not present

## 2016-12-07 DIAGNOSIS — E1169 Type 2 diabetes mellitus with other specified complication: Secondary | ICD-10-CM | POA: Diagnosis not present

## 2016-12-14 DIAGNOSIS — R609 Edema, unspecified: Secondary | ICD-10-CM | POA: Diagnosis not present

## 2016-12-20 DIAGNOSIS — Z7984 Long term (current) use of oral hypoglycemic drugs: Secondary | ICD-10-CM | POA: Diagnosis not present

## 2016-12-20 DIAGNOSIS — Z7952 Long term (current) use of systemic steroids: Secondary | ICD-10-CM | POA: Diagnosis not present

## 2016-12-20 DIAGNOSIS — R600 Localized edema: Secondary | ICD-10-CM | POA: Diagnosis not present

## 2016-12-20 DIAGNOSIS — Z8781 Personal history of (healed) traumatic fracture: Secondary | ICD-10-CM | POA: Diagnosis not present

## 2016-12-20 DIAGNOSIS — H9193 Unspecified hearing loss, bilateral: Secondary | ICD-10-CM | POA: Diagnosis not present

## 2016-12-20 DIAGNOSIS — Z9181 History of falling: Secondary | ICD-10-CM | POA: Diagnosis not present

## 2016-12-20 DIAGNOSIS — M130 Polyarthritis, unspecified: Secondary | ICD-10-CM | POA: Diagnosis not present

## 2016-12-20 DIAGNOSIS — M6281 Muscle weakness (generalized): Secondary | ICD-10-CM | POA: Diagnosis not present

## 2016-12-20 DIAGNOSIS — I10 Essential (primary) hypertension: Secondary | ICD-10-CM | POA: Diagnosis not present

## 2016-12-26 DIAGNOSIS — I10 Essential (primary) hypertension: Secondary | ICD-10-CM | POA: Diagnosis not present

## 2016-12-26 DIAGNOSIS — H9193 Unspecified hearing loss, bilateral: Secondary | ICD-10-CM | POA: Diagnosis not present

## 2016-12-26 DIAGNOSIS — Z9181 History of falling: Secondary | ICD-10-CM | POA: Diagnosis not present

## 2016-12-26 DIAGNOSIS — M6281 Muscle weakness (generalized): Secondary | ICD-10-CM | POA: Diagnosis not present

## 2016-12-26 DIAGNOSIS — Z8781 Personal history of (healed) traumatic fracture: Secondary | ICD-10-CM | POA: Diagnosis not present

## 2016-12-26 DIAGNOSIS — Z7952 Long term (current) use of systemic steroids: Secondary | ICD-10-CM | POA: Diagnosis not present

## 2016-12-26 DIAGNOSIS — M130 Polyarthritis, unspecified: Secondary | ICD-10-CM | POA: Diagnosis not present

## 2016-12-26 DIAGNOSIS — Z7984 Long term (current) use of oral hypoglycemic drugs: Secondary | ICD-10-CM | POA: Diagnosis not present

## 2016-12-26 DIAGNOSIS — R600 Localized edema: Secondary | ICD-10-CM | POA: Diagnosis not present

## 2016-12-27 DIAGNOSIS — H9193 Unspecified hearing loss, bilateral: Secondary | ICD-10-CM | POA: Diagnosis not present

## 2016-12-27 DIAGNOSIS — R600 Localized edema: Secondary | ICD-10-CM | POA: Diagnosis not present

## 2016-12-27 DIAGNOSIS — M130 Polyarthritis, unspecified: Secondary | ICD-10-CM | POA: Diagnosis not present

## 2016-12-27 DIAGNOSIS — M6281 Muscle weakness (generalized): Secondary | ICD-10-CM | POA: Diagnosis not present

## 2016-12-27 DIAGNOSIS — Z7984 Long term (current) use of oral hypoglycemic drugs: Secondary | ICD-10-CM | POA: Diagnosis not present

## 2016-12-27 DIAGNOSIS — Z7952 Long term (current) use of systemic steroids: Secondary | ICD-10-CM | POA: Diagnosis not present

## 2016-12-27 DIAGNOSIS — Z8781 Personal history of (healed) traumatic fracture: Secondary | ICD-10-CM | POA: Diagnosis not present

## 2016-12-27 DIAGNOSIS — Z9181 History of falling: Secondary | ICD-10-CM | POA: Diagnosis not present

## 2016-12-27 DIAGNOSIS — I10 Essential (primary) hypertension: Secondary | ICD-10-CM | POA: Diagnosis not present

## 2016-12-28 DIAGNOSIS — Z8781 Personal history of (healed) traumatic fracture: Secondary | ICD-10-CM | POA: Diagnosis not present

## 2016-12-28 DIAGNOSIS — I10 Essential (primary) hypertension: Secondary | ICD-10-CM | POA: Diagnosis not present

## 2016-12-28 DIAGNOSIS — Z7984 Long term (current) use of oral hypoglycemic drugs: Secondary | ICD-10-CM | POA: Diagnosis not present

## 2016-12-28 DIAGNOSIS — H9193 Unspecified hearing loss, bilateral: Secondary | ICD-10-CM | POA: Diagnosis not present

## 2016-12-28 DIAGNOSIS — M130 Polyarthritis, unspecified: Secondary | ICD-10-CM | POA: Diagnosis not present

## 2016-12-28 DIAGNOSIS — Z9181 History of falling: Secondary | ICD-10-CM | POA: Diagnosis not present

## 2016-12-28 DIAGNOSIS — M6281 Muscle weakness (generalized): Secondary | ICD-10-CM | POA: Diagnosis not present

## 2016-12-28 DIAGNOSIS — Z7952 Long term (current) use of systemic steroids: Secondary | ICD-10-CM | POA: Diagnosis not present

## 2016-12-28 DIAGNOSIS — R600 Localized edema: Secondary | ICD-10-CM | POA: Diagnosis not present

## 2017-01-02 DIAGNOSIS — Z7984 Long term (current) use of oral hypoglycemic drugs: Secondary | ICD-10-CM | POA: Diagnosis not present

## 2017-01-02 DIAGNOSIS — Z9181 History of falling: Secondary | ICD-10-CM | POA: Diagnosis not present

## 2017-01-02 DIAGNOSIS — I10 Essential (primary) hypertension: Secondary | ICD-10-CM | POA: Diagnosis not present

## 2017-01-02 DIAGNOSIS — M6281 Muscle weakness (generalized): Secondary | ICD-10-CM | POA: Diagnosis not present

## 2017-01-02 DIAGNOSIS — Z8781 Personal history of (healed) traumatic fracture: Secondary | ICD-10-CM | POA: Diagnosis not present

## 2017-01-02 DIAGNOSIS — H9193 Unspecified hearing loss, bilateral: Secondary | ICD-10-CM | POA: Diagnosis not present

## 2017-01-02 DIAGNOSIS — Z7952 Long term (current) use of systemic steroids: Secondary | ICD-10-CM | POA: Diagnosis not present

## 2017-01-02 DIAGNOSIS — M130 Polyarthritis, unspecified: Secondary | ICD-10-CM | POA: Diagnosis not present

## 2017-01-02 DIAGNOSIS — R600 Localized edema: Secondary | ICD-10-CM | POA: Diagnosis not present

## 2017-01-04 DIAGNOSIS — Z8781 Personal history of (healed) traumatic fracture: Secondary | ICD-10-CM | POA: Diagnosis not present

## 2017-01-04 DIAGNOSIS — Z7952 Long term (current) use of systemic steroids: Secondary | ICD-10-CM | POA: Diagnosis not present

## 2017-01-04 DIAGNOSIS — N189 Chronic kidney disease, unspecified: Secondary | ICD-10-CM | POA: Diagnosis not present

## 2017-01-04 DIAGNOSIS — M130 Polyarthritis, unspecified: Secondary | ICD-10-CM | POA: Diagnosis not present

## 2017-01-04 DIAGNOSIS — Z9181 History of falling: Secondary | ICD-10-CM | POA: Diagnosis not present

## 2017-01-04 DIAGNOSIS — I10 Essential (primary) hypertension: Secondary | ICD-10-CM | POA: Diagnosis not present

## 2017-01-04 DIAGNOSIS — R600 Localized edema: Secondary | ICD-10-CM | POA: Diagnosis not present

## 2017-01-04 DIAGNOSIS — E118 Type 2 diabetes mellitus with unspecified complications: Secondary | ICD-10-CM | POA: Diagnosis not present

## 2017-01-04 DIAGNOSIS — Z7984 Long term (current) use of oral hypoglycemic drugs: Secondary | ICD-10-CM | POA: Diagnosis not present

## 2017-01-04 DIAGNOSIS — M6281 Muscle weakness (generalized): Secondary | ICD-10-CM | POA: Diagnosis not present

## 2017-01-04 DIAGNOSIS — I110 Hypertensive heart disease with heart failure: Secondary | ICD-10-CM | POA: Diagnosis not present

## 2017-01-04 DIAGNOSIS — J441 Chronic obstructive pulmonary disease with (acute) exacerbation: Secondary | ICD-10-CM | POA: Diagnosis not present

## 2017-01-04 DIAGNOSIS — H9193 Unspecified hearing loss, bilateral: Secondary | ICD-10-CM | POA: Diagnosis not present

## 2017-01-09 DIAGNOSIS — R600 Localized edema: Secondary | ICD-10-CM | POA: Diagnosis not present

## 2017-01-09 DIAGNOSIS — H9193 Unspecified hearing loss, bilateral: Secondary | ICD-10-CM | POA: Diagnosis not present

## 2017-01-09 DIAGNOSIS — I10 Essential (primary) hypertension: Secondary | ICD-10-CM | POA: Diagnosis not present

## 2017-01-09 DIAGNOSIS — Z9181 History of falling: Secondary | ICD-10-CM | POA: Diagnosis not present

## 2017-01-09 DIAGNOSIS — Z7984 Long term (current) use of oral hypoglycemic drugs: Secondary | ICD-10-CM | POA: Diagnosis not present

## 2017-01-09 DIAGNOSIS — Z8781 Personal history of (healed) traumatic fracture: Secondary | ICD-10-CM | POA: Diagnosis not present

## 2017-01-09 DIAGNOSIS — M6281 Muscle weakness (generalized): Secondary | ICD-10-CM | POA: Diagnosis not present

## 2017-01-09 DIAGNOSIS — M130 Polyarthritis, unspecified: Secondary | ICD-10-CM | POA: Diagnosis not present

## 2017-01-09 DIAGNOSIS — Z7952 Long term (current) use of systemic steroids: Secondary | ICD-10-CM | POA: Diagnosis not present

## 2017-01-10 DIAGNOSIS — Z8781 Personal history of (healed) traumatic fracture: Secondary | ICD-10-CM | POA: Diagnosis not present

## 2017-01-10 DIAGNOSIS — R600 Localized edema: Secondary | ICD-10-CM | POA: Diagnosis not present

## 2017-01-10 DIAGNOSIS — Z9181 History of falling: Secondary | ICD-10-CM | POA: Diagnosis not present

## 2017-01-10 DIAGNOSIS — M6281 Muscle weakness (generalized): Secondary | ICD-10-CM | POA: Diagnosis not present

## 2017-01-10 DIAGNOSIS — H9193 Unspecified hearing loss, bilateral: Secondary | ICD-10-CM | POA: Diagnosis not present

## 2017-01-10 DIAGNOSIS — Z7984 Long term (current) use of oral hypoglycemic drugs: Secondary | ICD-10-CM | POA: Diagnosis not present

## 2017-01-10 DIAGNOSIS — Z7952 Long term (current) use of systemic steroids: Secondary | ICD-10-CM | POA: Diagnosis not present

## 2017-01-10 DIAGNOSIS — I10 Essential (primary) hypertension: Secondary | ICD-10-CM | POA: Diagnosis not present

## 2017-01-10 DIAGNOSIS — M130 Polyarthritis, unspecified: Secondary | ICD-10-CM | POA: Diagnosis not present

## 2017-01-17 DIAGNOSIS — Z7984 Long term (current) use of oral hypoglycemic drugs: Secondary | ICD-10-CM | POA: Diagnosis not present

## 2017-01-17 DIAGNOSIS — I10 Essential (primary) hypertension: Secondary | ICD-10-CM | POA: Diagnosis not present

## 2017-01-17 DIAGNOSIS — Z8781 Personal history of (healed) traumatic fracture: Secondary | ICD-10-CM | POA: Diagnosis not present

## 2017-01-17 DIAGNOSIS — H9193 Unspecified hearing loss, bilateral: Secondary | ICD-10-CM | POA: Diagnosis not present

## 2017-01-17 DIAGNOSIS — M6281 Muscle weakness (generalized): Secondary | ICD-10-CM | POA: Diagnosis not present

## 2017-01-17 DIAGNOSIS — M130 Polyarthritis, unspecified: Secondary | ICD-10-CM | POA: Diagnosis not present

## 2017-01-17 DIAGNOSIS — R600 Localized edema: Secondary | ICD-10-CM | POA: Diagnosis not present

## 2017-01-17 DIAGNOSIS — Z9181 History of falling: Secondary | ICD-10-CM | POA: Diagnosis not present

## 2017-01-17 DIAGNOSIS — Z7952 Long term (current) use of systemic steroids: Secondary | ICD-10-CM | POA: Diagnosis not present

## 2017-01-18 DIAGNOSIS — Z7984 Long term (current) use of oral hypoglycemic drugs: Secondary | ICD-10-CM | POA: Diagnosis not present

## 2017-01-18 DIAGNOSIS — M130 Polyarthritis, unspecified: Secondary | ICD-10-CM | POA: Diagnosis not present

## 2017-01-18 DIAGNOSIS — Z7952 Long term (current) use of systemic steroids: Secondary | ICD-10-CM | POA: Diagnosis not present

## 2017-01-18 DIAGNOSIS — Z8781 Personal history of (healed) traumatic fracture: Secondary | ICD-10-CM | POA: Diagnosis not present

## 2017-01-18 DIAGNOSIS — Z9181 History of falling: Secondary | ICD-10-CM | POA: Diagnosis not present

## 2017-01-18 DIAGNOSIS — M6281 Muscle weakness (generalized): Secondary | ICD-10-CM | POA: Diagnosis not present

## 2017-01-18 DIAGNOSIS — I10 Essential (primary) hypertension: Secondary | ICD-10-CM | POA: Diagnosis not present

## 2017-01-18 DIAGNOSIS — H9193 Unspecified hearing loss, bilateral: Secondary | ICD-10-CM | POA: Diagnosis not present

## 2017-01-18 DIAGNOSIS — R600 Localized edema: Secondary | ICD-10-CM | POA: Diagnosis not present

## 2017-01-21 DIAGNOSIS — H9193 Unspecified hearing loss, bilateral: Secondary | ICD-10-CM | POA: Diagnosis not present

## 2017-01-21 DIAGNOSIS — I10 Essential (primary) hypertension: Secondary | ICD-10-CM | POA: Diagnosis not present

## 2017-01-21 DIAGNOSIS — Z7984 Long term (current) use of oral hypoglycemic drugs: Secondary | ICD-10-CM | POA: Diagnosis not present

## 2017-01-21 DIAGNOSIS — M6281 Muscle weakness (generalized): Secondary | ICD-10-CM | POA: Diagnosis not present

## 2017-01-21 DIAGNOSIS — R600 Localized edema: Secondary | ICD-10-CM | POA: Diagnosis not present

## 2017-01-21 DIAGNOSIS — Z9181 History of falling: Secondary | ICD-10-CM | POA: Diagnosis not present

## 2017-01-21 DIAGNOSIS — M130 Polyarthritis, unspecified: Secondary | ICD-10-CM | POA: Diagnosis not present

## 2017-01-21 DIAGNOSIS — Z8781 Personal history of (healed) traumatic fracture: Secondary | ICD-10-CM | POA: Diagnosis not present

## 2017-01-21 DIAGNOSIS — Z7952 Long term (current) use of systemic steroids: Secondary | ICD-10-CM | POA: Diagnosis not present

## 2017-01-23 DIAGNOSIS — H9193 Unspecified hearing loss, bilateral: Secondary | ICD-10-CM | POA: Diagnosis not present

## 2017-01-23 DIAGNOSIS — I10 Essential (primary) hypertension: Secondary | ICD-10-CM | POA: Diagnosis not present

## 2017-01-23 DIAGNOSIS — R600 Localized edema: Secondary | ICD-10-CM | POA: Diagnosis not present

## 2017-01-23 DIAGNOSIS — Z9181 History of falling: Secondary | ICD-10-CM | POA: Diagnosis not present

## 2017-01-23 DIAGNOSIS — M130 Polyarthritis, unspecified: Secondary | ICD-10-CM | POA: Diagnosis not present

## 2017-01-23 DIAGNOSIS — M6281 Muscle weakness (generalized): Secondary | ICD-10-CM | POA: Diagnosis not present

## 2017-01-23 DIAGNOSIS — Z7952 Long term (current) use of systemic steroids: Secondary | ICD-10-CM | POA: Diagnosis not present

## 2017-01-23 DIAGNOSIS — Z8781 Personal history of (healed) traumatic fracture: Secondary | ICD-10-CM | POA: Diagnosis not present

## 2017-01-23 DIAGNOSIS — Z7984 Long term (current) use of oral hypoglycemic drugs: Secondary | ICD-10-CM | POA: Diagnosis not present

## 2017-01-30 DIAGNOSIS — M6281 Muscle weakness (generalized): Secondary | ICD-10-CM | POA: Diagnosis not present

## 2017-01-30 DIAGNOSIS — H9193 Unspecified hearing loss, bilateral: Secondary | ICD-10-CM | POA: Diagnosis not present

## 2017-01-30 DIAGNOSIS — I10 Essential (primary) hypertension: Secondary | ICD-10-CM | POA: Diagnosis not present

## 2017-01-30 DIAGNOSIS — R600 Localized edema: Secondary | ICD-10-CM | POA: Diagnosis not present

## 2017-01-30 DIAGNOSIS — M130 Polyarthritis, unspecified: Secondary | ICD-10-CM | POA: Diagnosis not present

## 2017-01-30 DIAGNOSIS — Z9181 History of falling: Secondary | ICD-10-CM | POA: Diagnosis not present

## 2017-01-30 DIAGNOSIS — Z7952 Long term (current) use of systemic steroids: Secondary | ICD-10-CM | POA: Diagnosis not present

## 2017-01-30 DIAGNOSIS — Z8781 Personal history of (healed) traumatic fracture: Secondary | ICD-10-CM | POA: Diagnosis not present

## 2017-01-30 DIAGNOSIS — Z7984 Long term (current) use of oral hypoglycemic drugs: Secondary | ICD-10-CM | POA: Diagnosis not present

## 2017-02-01 DIAGNOSIS — Z9181 History of falling: Secondary | ICD-10-CM | POA: Diagnosis not present

## 2017-02-01 DIAGNOSIS — Z7952 Long term (current) use of systemic steroids: Secondary | ICD-10-CM | POA: Diagnosis not present

## 2017-02-01 DIAGNOSIS — Z8781 Personal history of (healed) traumatic fracture: Secondary | ICD-10-CM | POA: Diagnosis not present

## 2017-02-01 DIAGNOSIS — Z7984 Long term (current) use of oral hypoglycemic drugs: Secondary | ICD-10-CM | POA: Diagnosis not present

## 2017-02-01 DIAGNOSIS — M6281 Muscle weakness (generalized): Secondary | ICD-10-CM | POA: Diagnosis not present

## 2017-02-01 DIAGNOSIS — M130 Polyarthritis, unspecified: Secondary | ICD-10-CM | POA: Diagnosis not present

## 2017-02-01 DIAGNOSIS — I10 Essential (primary) hypertension: Secondary | ICD-10-CM | POA: Diagnosis not present

## 2017-02-01 DIAGNOSIS — H9193 Unspecified hearing loss, bilateral: Secondary | ICD-10-CM | POA: Diagnosis not present

## 2017-02-01 DIAGNOSIS — R600 Localized edema: Secondary | ICD-10-CM | POA: Diagnosis not present

## 2017-02-04 DIAGNOSIS — C61 Malignant neoplasm of prostate: Secondary | ICD-10-CM | POA: Diagnosis not present

## 2017-02-08 DIAGNOSIS — N189 Chronic kidney disease, unspecified: Secondary | ICD-10-CM | POA: Diagnosis not present

## 2017-02-08 DIAGNOSIS — J441 Chronic obstructive pulmonary disease with (acute) exacerbation: Secondary | ICD-10-CM | POA: Diagnosis not present

## 2017-02-08 DIAGNOSIS — M130 Polyarthritis, unspecified: Secondary | ICD-10-CM | POA: Diagnosis not present

## 2017-02-08 DIAGNOSIS — E1169 Type 2 diabetes mellitus with other specified complication: Secondary | ICD-10-CM | POA: Diagnosis not present

## 2017-02-08 DIAGNOSIS — R600 Localized edema: Secondary | ICD-10-CM | POA: Diagnosis not present

## 2017-02-08 DIAGNOSIS — I10 Essential (primary) hypertension: Secondary | ICD-10-CM | POA: Diagnosis not present

## 2017-02-11 DIAGNOSIS — C61 Malignant neoplasm of prostate: Secondary | ICD-10-CM | POA: Diagnosis not present

## 2017-02-11 DIAGNOSIS — N401 Enlarged prostate with lower urinary tract symptoms: Secondary | ICD-10-CM | POA: Diagnosis not present

## 2017-02-11 DIAGNOSIS — Z5111 Encounter for antineoplastic chemotherapy: Secondary | ICD-10-CM | POA: Diagnosis not present

## 2017-03-15 DIAGNOSIS — J441 Chronic obstructive pulmonary disease with (acute) exacerbation: Secondary | ICD-10-CM | POA: Diagnosis not present

## 2017-03-15 DIAGNOSIS — N189 Chronic kidney disease, unspecified: Secondary | ICD-10-CM | POA: Diagnosis not present

## 2017-03-15 DIAGNOSIS — M130 Polyarthritis, unspecified: Secondary | ICD-10-CM | POA: Diagnosis not present

## 2017-03-15 DIAGNOSIS — E1169 Type 2 diabetes mellitus with other specified complication: Secondary | ICD-10-CM | POA: Diagnosis not present

## 2017-04-17 DIAGNOSIS — I10 Essential (primary) hypertension: Secondary | ICD-10-CM | POA: Diagnosis not present

## 2017-04-17 DIAGNOSIS — E874 Mixed disorder of acid-base balance: Secondary | ICD-10-CM | POA: Diagnosis not present

## 2017-04-17 DIAGNOSIS — R600 Localized edema: Secondary | ICD-10-CM | POA: Diagnosis not present

## 2017-04-17 DIAGNOSIS — E876 Hypokalemia: Secondary | ICD-10-CM | POA: Diagnosis not present

## 2017-04-17 DIAGNOSIS — N189 Chronic kidney disease, unspecified: Secondary | ICD-10-CM | POA: Diagnosis not present

## 2017-05-13 DIAGNOSIS — C61 Malignant neoplasm of prostate: Secondary | ICD-10-CM | POA: Diagnosis not present

## 2017-05-17 DIAGNOSIS — G5603 Carpal tunnel syndrome, bilateral upper limbs: Secondary | ICD-10-CM | POA: Diagnosis not present

## 2017-05-17 DIAGNOSIS — E119 Type 2 diabetes mellitus without complications: Secondary | ICD-10-CM | POA: Diagnosis not present

## 2017-05-17 DIAGNOSIS — R209 Unspecified disturbances of skin sensation: Secondary | ICD-10-CM | POA: Diagnosis not present

## 2017-05-17 DIAGNOSIS — E118 Type 2 diabetes mellitus with unspecified complications: Secondary | ICD-10-CM | POA: Diagnosis not present

## 2017-05-17 DIAGNOSIS — I110 Hypertensive heart disease with heart failure: Secondary | ICD-10-CM | POA: Diagnosis not present

## 2017-05-17 DIAGNOSIS — R609 Edema, unspecified: Secondary | ICD-10-CM | POA: Diagnosis not present

## 2017-05-20 DIAGNOSIS — C61 Malignant neoplasm of prostate: Secondary | ICD-10-CM | POA: Diagnosis not present

## 2017-05-20 DIAGNOSIS — N401 Enlarged prostate with lower urinary tract symptoms: Secondary | ICD-10-CM | POA: Diagnosis not present

## 2017-05-20 DIAGNOSIS — Z5111 Encounter for antineoplastic chemotherapy: Secondary | ICD-10-CM | POA: Diagnosis not present

## 2017-05-30 DIAGNOSIS — G5603 Carpal tunnel syndrome, bilateral upper limbs: Secondary | ICD-10-CM | POA: Diagnosis not present

## 2017-05-30 DIAGNOSIS — R069 Unspecified abnormalities of breathing: Secondary | ICD-10-CM | POA: Diagnosis not present

## 2017-05-30 DIAGNOSIS — I110 Hypertensive heart disease with heart failure: Secondary | ICD-10-CM | POA: Diagnosis not present

## 2017-06-13 DIAGNOSIS — N189 Chronic kidney disease, unspecified: Secondary | ICD-10-CM | POA: Diagnosis not present

## 2017-06-13 DIAGNOSIS — J449 Chronic obstructive pulmonary disease, unspecified: Secondary | ICD-10-CM | POA: Diagnosis not present

## 2017-06-13 DIAGNOSIS — G5603 Carpal tunnel syndrome, bilateral upper limbs: Secondary | ICD-10-CM | POA: Diagnosis not present

## 2017-06-13 DIAGNOSIS — E1169 Type 2 diabetes mellitus with other specified complication: Secondary | ICD-10-CM | POA: Diagnosis not present

## 2017-06-13 DIAGNOSIS — J441 Chronic obstructive pulmonary disease with (acute) exacerbation: Secondary | ICD-10-CM | POA: Diagnosis not present

## 2017-07-08 DIAGNOSIS — I110 Hypertensive heart disease with heart failure: Secondary | ICD-10-CM | POA: Diagnosis not present

## 2017-07-08 DIAGNOSIS — Z23 Encounter for immunization: Secondary | ICD-10-CM | POA: Diagnosis not present

## 2017-07-19 DIAGNOSIS — M79601 Pain in right arm: Secondary | ICD-10-CM | POA: Diagnosis not present

## 2017-07-19 DIAGNOSIS — M79602 Pain in left arm: Secondary | ICD-10-CM | POA: Diagnosis not present

## 2017-08-23 DIAGNOSIS — C61 Malignant neoplasm of prostate: Secondary | ICD-10-CM | POA: Diagnosis not present

## 2017-08-30 DIAGNOSIS — C61 Malignant neoplasm of prostate: Secondary | ICD-10-CM | POA: Diagnosis not present

## 2017-08-30 DIAGNOSIS — N3001 Acute cystitis with hematuria: Secondary | ICD-10-CM | POA: Diagnosis not present

## 2017-08-30 DIAGNOSIS — N401 Enlarged prostate with lower urinary tract symptoms: Secondary | ICD-10-CM | POA: Diagnosis not present

## 2017-09-28 DIAGNOSIS — E1169 Type 2 diabetes mellitus with other specified complication: Secondary | ICD-10-CM | POA: Diagnosis not present

## 2017-09-28 DIAGNOSIS — I110 Hypertensive heart disease with heart failure: Secondary | ICD-10-CM | POA: Diagnosis not present

## 2017-10-11 DIAGNOSIS — I10 Essential (primary) hypertension: Secondary | ICD-10-CM | POA: Diagnosis not present

## 2017-10-11 DIAGNOSIS — E119 Type 2 diabetes mellitus without complications: Secondary | ICD-10-CM | POA: Diagnosis not present

## 2017-10-18 DIAGNOSIS — I10 Essential (primary) hypertension: Secondary | ICD-10-CM | POA: Diagnosis not present

## 2017-10-18 DIAGNOSIS — E119 Type 2 diabetes mellitus without complications: Secondary | ICD-10-CM | POA: Diagnosis not present

## 2017-10-23 ENCOUNTER — Emergency Department (HOSPITAL_COMMUNITY): Payer: Medicare Other

## 2017-10-23 ENCOUNTER — Encounter (HOSPITAL_COMMUNITY): Payer: Self-pay | Admitting: Emergency Medicine

## 2017-10-23 ENCOUNTER — Emergency Department (HOSPITAL_COMMUNITY)
Admission: EM | Admit: 2017-10-23 | Discharge: 2017-10-24 | Disposition: A | Discharge status: Home / Self Care | Payer: Medicare Other | Attending: Emergency Medicine | Admitting: Emergency Medicine

## 2017-10-23 DIAGNOSIS — R531 Weakness: Secondary | ICD-10-CM

## 2017-10-23 DIAGNOSIS — Z7982 Long term (current) use of aspirin: Secondary | ICD-10-CM | POA: Diagnosis not present

## 2017-10-23 DIAGNOSIS — Z87891 Personal history of nicotine dependence: Secondary | ICD-10-CM | POA: Diagnosis not present

## 2017-10-23 DIAGNOSIS — J810 Acute pulmonary edema: Secondary | ICD-10-CM | POA: Diagnosis not present

## 2017-10-23 DIAGNOSIS — R402441 Other coma, without documented Glasgow coma scale score, or with partial score reported, in the field [EMT or ambulance]: Secondary | ICD-10-CM | POA: Diagnosis not present

## 2017-10-23 DIAGNOSIS — F4321 Adjustment disorder with depressed mood: Secondary | ICD-10-CM

## 2017-10-23 DIAGNOSIS — Z79899 Other long term (current) drug therapy: Secondary | ICD-10-CM | POA: Diagnosis not present

## 2017-10-23 DIAGNOSIS — F4329 Adjustment disorder with other symptoms: Secondary | ICD-10-CM | POA: Insufficient documentation

## 2017-10-23 DIAGNOSIS — E119 Type 2 diabetes mellitus without complications: Secondary | ICD-10-CM | POA: Insufficient documentation

## 2017-10-23 DIAGNOSIS — Z634 Disappearance and death of family member: Secondary | ICD-10-CM | POA: Insufficient documentation

## 2017-10-23 DIAGNOSIS — I10 Essential (primary) hypertension: Secondary | ICD-10-CM | POA: Insufficient documentation

## 2017-10-23 DIAGNOSIS — R0989 Other specified symptoms and signs involving the circulatory and respiratory systems: Secondary | ICD-10-CM | POA: Diagnosis not present

## 2017-10-23 LAB — COMPREHENSIVE METABOLIC PANEL
ALBUMIN: 3.3 g/dL — AB (ref 3.5–5.0)
ALT: 19 U/L (ref 17–63)
AST: 36 U/L (ref 15–41)
Alkaline Phosphatase: 79 U/L (ref 38–126)
Anion gap: 5 (ref 5–15)
BUN: 37 mg/dL — AB (ref 6–20)
CHLORIDE: 112 mmol/L — AB (ref 101–111)
CO2: 26 mmol/L (ref 22–32)
CREATININE: 2.07 mg/dL — AB (ref 0.61–1.24)
Calcium: 9.8 mg/dL (ref 8.9–10.3)
GFR calc Af Amer: 31 mL/min — ABNORMAL LOW (ref >60)
GFR calc non Af Amer: 26 mL/min — ABNORMAL LOW (ref >60)
GLUCOSE: 105 mg/dL — AB (ref 65–99)
POTASSIUM: 4.7 mmol/L (ref 3.5–5.1)
Sodium: 143 mmol/L (ref 135–145)
Total Bilirubin: 1.3 mg/dL — ABNORMAL HIGH (ref 0.3–1.2)
Total Protein: 7 g/dL (ref 6.5–8.1)

## 2017-10-23 LAB — RAPID URINE DRUG SCREEN, HOSP PERFORMED
AMPHETAMINES: NOT DETECTED
BARBITURATES: NOT DETECTED
BENZODIAZEPINES: NOT DETECTED
Cocaine: NOT DETECTED
Opiates: NOT DETECTED
TETRAHYDROCANNABINOL: NOT DETECTED

## 2017-10-23 LAB — ETHANOL: Alcohol, Ethyl (B): <10 mg/dL (ref <10)

## 2017-10-23 MED ORDER — FUROSEMIDE 10 MG/ML IJ SOLN
10.0000 mg | Freq: Once | INTRAMUSCULAR | Status: AC
  Administered 2017-10-24: 10 mg via INTRAVENOUS
  Filled 2017-10-23: qty 4

## 2017-10-23 NOTE — ED Notes (Addendum)
Pt IV started by IV team, were unable to draw blood from it. Spoke with ED providers about inability to get blood.

## 2017-10-23 NOTE — ED Notes (Signed)
Bed: NI62 Expected date:  Expected time:  Means of arrival:  Comments: Hold for pt in 27

## 2017-10-23 NOTE — ED Triage Notes (Signed)
Per EMS-states his son in law died in November-states since his death, patient is showing signs of depression-poor appetite, decreased energy-patient states he just feels tired and weak

## 2017-10-23 NOTE — ED Notes (Signed)
Bed: WA27 Expected date:  Expected time:  Means of arrival:  Comments: EMS-depressed

## 2017-10-23 NOTE — ED Notes (Signed)
IVT @ BS.

## 2017-10-23 NOTE — Progress Notes (Signed)
10/23/17 1800 Attempted x2 to draw labs Rt AC and Lt hand. Both unsuccessful. Call Lab to draw.

## 2017-10-23 NOTE — ED Notes (Signed)
This nurse attempted to straight stick pt, unsuccessful.

## 2017-10-23 NOTE — Progress Notes (Signed)
Unable to obtain blood for lab with arterial stick.  RN aware.

## 2017-10-23 NOTE — ED Notes (Signed)
Attempted to draw blood for labs  Stuck pt twice  Unable to obtain  PA notified

## 2017-10-23 NOTE — ED Provider Notes (Signed)
[facility] DEPT Provider Note   CSN: [id number] Arrival date & time: 10/23/17  1653     History   Chief Complaint Chief Complaint  Patient presents with  . Suicidal    HPI [name] is a 84 y.o. male who presents the emergency department brought in by EMS for increased weakness and shortness of breath.  His physical therapist called out to EMS today because he seemed to have labored breathing and was more weak than usual.  He is attended by both of his daughters.  They state that he has had a complicated grieving process brought on by the loss of his son-in-law in November.  He lives with his daughter and his son-in-law used to stay with him all day long.  The patient states that he is extremely sad and lonely because he is by himself all day.  He is extremely tearful but denies suicidal ideation.  His daughter states that he has not been febrile he has no history of congestive heart failure they state that they feel he may be more deconditioned because of his depression and not wanting to get up as much.  He denies fevers, chills, chest pain.  HPI  Past Medical History:  Diagnosis Date  . Adenomatous polyps 09/09/2008  . Arthritis   . Carpal tunnel syndrome, bilateral 11/21/2016  . Colon cancer ([place]) dx'd 2008   surg only  . Diabetes mellitus without complication ([place])   . Falls frequently   . Hypertension   . Prostate cancer ([place]) dx'd 1993   surg only    Patient Active Problem List   Diagnosis Date Noted  . Carpal tunnel syndrome, bilateral 11/21/2016  . Falls frequently   . Vertigo 05/20/2014  . PROSTATE CANCER 08/19/2008  . DM 08/19/2008  . HYPERLIPIDEMIA 08/19/2008  . HYPERTENSION 08/19/2008  . Personal history of colon cancer, stage II 08/19/2008    Past Surgical History:  Procedure Laterality Date  . CATARACT EXTRACTION W/PHACO Left 06/10/2013   Procedure: CATARACT EXTRACTION PHACO AND INTRAOCULAR LENS PLACEMENT (IOC);   Surgeon: Adonis Brook, MD;  Location: [facility];  Service: Ophthalmology;  Laterality: Left;  . COLONOSCOPY    . ESOPHAGOGASTRODUODENOSCOPY    . EYE SURGERY     cataract surgery, right eye  . laparoscopic assisted right hemicolectomy  09/21/07       Home Medications    Prior to Admission medications   Medication Sig Start Date End Date Taking? Authorizing Provider  amLODipine (NORVASC) 10 MG tablet Take 10 mg by mouth daily.   Yes [provider]  aspirin EC 81 MG tablet Take 1 tablet (81 mg total) by mouth daily. 05/22/14  Yes Reyne Dumas, MD  atorvastatin (LIPITOR) 40 MG tablet Take 40 mg by mouth daily.    [provider]  brimonidine (ALPHAGAN P) 0.1 % SOLN Place 1 drop into both eyes 2 (two) times daily.    [provider]  chlorthalidone (HYGROTON) 25 MG tablet Take 25 mg by mouth daily.    [provider]  ENTRESTO 24-26 MG Take 1 tablet by mouth 2 (two) times daily. 08/01/17   [provider]  HYDROcodone-acetaminophen (NORCO/VICODIN) 5-325 MG per tablet Take 1-2 tablets by mouth every 6 hours as needed for pain and/or cough. 04/20/15   Pisciotta, Elmyra Ricks, PA-C  latanoprost (XALATAN) 0.005 % ophthalmic solution Place 1 drop into both eyes at bedtime.  03/26/15   [provider]  meclizine (ANTIVERT) 25 MG tablet Take 1 tablet (  25 mg total) by mouth 3 (three) times daily as needed for dizziness. 05/22/14   Reyne Dumas, MD  pioglitazone (ACTOS) 15 MG tablet Take 15 mg by mouth daily.    [provider]  potassium chloride SA (K-DUR,KLOR-CON) 20 MEQ tablet Take 1 tablet (20 mEq total) by mouth daily. 04/20/15   Pisciotta, Elmyra Ricks, PA-C  XTANDI 40 MG capsule Take 40 mg by mouth daily. 10/01/17   [provider]    Family History Family History  Problem Relation Age of Onset  . Diabetes Unknown     Social History Social History   Tobacco Use  . Smoking status: Former Smoker    Years: 1.00    Types: Cigarettes     Last attempt to quit: 06/10/1983    Years since quitting: 34.3  . Smokeless tobacco: Never Used  Substance Use Topics  . Alcohol use: Yes    Comment: occasional wine  . Drug use: No     Allergies   Patient has no known allergies.   Review of Systems Review of Systems Ten systems reviewed and are negative for acute change, except as noted in the HPI.    Physical Exam Updated Vital Signs BP (!) 171/79 (BP Location: Right Arm)   Pulse (!) 58   Temp 97.8 F (36.6 C) (Oral)   Resp 18   SpO2 98%   Physical Exam  Constitutional: He appears well-developed and well-nourished. No distress.  HENT:  Head: Normocephalic and atraumatic.  Eyes: Conjunctivae and EOM are normal. Pupils are equal, round, and reactive to light. No scleral icterus.  Right proptotic eye (chronic)  Neck: Normal range of motion. Neck supple.  Cardiovascular: Normal rate, regular rhythm and normal heart sounds.  Pulmonary/Chest: Effort normal and breath sounds normal. No stridor. No respiratory distress. He has no wheezes.  Abdominal: Soft. He exhibits no distension. There is no tenderness.  Musculoskeletal: He exhibits no edema.  Neurological: He is alert.  Skin: Skin is warm and dry. He is not diaphoretic.  Psychiatric: His behavior is normal.  Tearful, depressed  Nursing note and vitals reviewed.    ED Treatments / Results  Labs (all labs ordered are listed, but only abnormal results are displayed) Labs Reviewed  RAPID URINE DRUG SCREEN, HOSP PERFORMED  COMPREHENSIVE METABOLIC PANEL  ETHANOL  CBC WITH DIFFERENTIAL/PLATELET  CBC WITH DIFFERENTIAL/PLATELET  BRAIN NATRIURETIC PEPTIDE  I-STAT TROPONIN, ED    EKG  EKG Interpretation None       Radiology Dg Chest 2 View  Result Date: 10/23/2017 CLINICAL DATA:  Fatigue.  Hypertension.  Suicidal ideation EXAM: CHEST  2 VIEW COMPARISON:  May 26, 2016 FINDINGS: The degree of inspiration is shallow. No edema or consolidation is evident. There  is borderline cardiomegaly with pulmonary venous hypertension. No adenopathy. No bone lesions. IMPRESSION: There is a degree of pulmonary vascular congestion. No frank edema or consolidation. Electronically Signed   By: Lowella Grip III M.D.   On: 10/23/2017 18:24    Procedures Procedures (including critical care time)  Medications Ordered in ED Medications - No data to display   Initial Impression / Assessment and Plan / ED Course  I have reviewed the triage vital signs and the nursing notes.  Pertinent labs & imaging results that were available during my care of the patient were reviewed by me and considered in my medical decision making (see chart for details).     After multiple attempts to draw blood for the rest of his labs including  arterial stick we were unable to access the patient's blood.  Patient has mild pulmonary vascular congestion.  Will give a small dose of IV Lasix at this time and he may take a small dose for the next 2 or 3 days unless he has any symptoms of presyncope or orthostatic hypertension.  Patient is advised to follow closely with his PCP for management of his deconditioning, shortness of breath and complicated grieving.  He appears appropriate for discharge at this time  Final Clinical Impressions(s) / ED Diagnoses   Final diagnoses:  Weakness  Acute pulmonary edema [facility])  Complicated grieving    ED Discharge Orders    None       Margarita Mail, PA-C 10/24/17 Pia Mau, MD 10/24/17 2332

## 2017-10-23 NOTE — ED Notes (Signed)
Respiratory blood draw unsuccessful, family has expressed frustrations in repeated sticks with no answers. Dr made aware.

## 2017-10-23 NOTE — ED Notes (Signed)
Attempted lab draw was unsuccessful

## 2017-10-23 NOTE — ED Notes (Signed)
Main lab called to attempt blood draw

## 2017-10-24 MED ORDER — FUROSEMIDE 20 MG PO TABS: 10.0000 mg | ORAL_TABLET | Freq: Every day | ORAL | 0 refills | Status: DC

## 2017-10-24 MED ORDER — LORAZEPAM 2 MG/ML IJ SOLN: 1.0000 mg | Freq: Once | INTRAMUSCULAR | Status: DC

## 2017-10-24 NOTE — Discharge Instructions (Signed)
Please take the medication fro fluid reduction. DO NOT continue to take it if you feel light headed, dizzy, or as if you are going to pass out when you stand. Please follow up VERY closely with Dr. Criss Rosales to have outpatient work up for weakness and for assessment of your depression and complicated grieving.    Get help right away if: You have severe chest pain, especially if the pain is crushing or pressure-like and spreads to the arms, back, neck, or jaw. THIS IS AN EMERGENCY. Do not wait to see if the pain will go away. Call for local emergency medical help. Do not drive yourself to the hospital. You have sweating, feel sick to your stomach (nauseous), or are experiencing shortness of breath. Your weight increases by 3 lb (1.4 kg) in 1 day or 5 lb (2.3 kg) in a week. You notice increasing shortness of breath that is unusual for you. This may happen during rest, sleep, or with activity. You develop chest pain (angina) or pain that is unusual for you. You notice more swelling in your hands, feet, ankles, or abdomen. You notice lasting (persistent) dizziness, blurred vision, headache, or unsteadiness. You begin to cough up bloody mucus (sputum). You are unable to sleep because it is hard to breathe. You begin to feel a ?jumping? or ?fluttering? sensation (palpitations) in the chest that is unusual for you.

## 2017-10-25 DIAGNOSIS — E1169 Type 2 diabetes mellitus with other specified complication: Secondary | ICD-10-CM | POA: Diagnosis not present

## 2017-11-01 DIAGNOSIS — E119 Type 2 diabetes mellitus without complications: Secondary | ICD-10-CM | POA: Diagnosis not present

## 2017-11-01 DIAGNOSIS — I10 Essential (primary) hypertension: Secondary | ICD-10-CM | POA: Diagnosis not present

## 2017-11-08 DIAGNOSIS — E119 Type 2 diabetes mellitus without complications: Secondary | ICD-10-CM | POA: Diagnosis not present

## 2017-11-08 DIAGNOSIS — I10 Essential (primary) hypertension: Secondary | ICD-10-CM | POA: Diagnosis not present

## 2017-11-15 DIAGNOSIS — E119 Type 2 diabetes mellitus without complications: Secondary | ICD-10-CM | POA: Diagnosis not present

## 2017-11-15 DIAGNOSIS — I10 Essential (primary) hypertension: Secondary | ICD-10-CM | POA: Diagnosis not present

## 2017-11-22 DIAGNOSIS — I10 Essential (primary) hypertension: Secondary | ICD-10-CM | POA: Diagnosis not present

## 2017-11-22 DIAGNOSIS — E119 Type 2 diabetes mellitus without complications: Secondary | ICD-10-CM | POA: Diagnosis not present

## 2017-11-22 DIAGNOSIS — R609 Edema, unspecified: Secondary | ICD-10-CM | POA: Diagnosis not present

## 2017-11-22 DIAGNOSIS — E118 Type 2 diabetes mellitus with unspecified complications: Secondary | ICD-10-CM | POA: Diagnosis not present

## 2017-11-29 DIAGNOSIS — I10 Essential (primary) hypertension: Secondary | ICD-10-CM | POA: Diagnosis not present

## 2017-11-29 DIAGNOSIS — E119 Type 2 diabetes mellitus without complications: Secondary | ICD-10-CM | POA: Diagnosis not present

## 2017-12-02 ENCOUNTER — Inpatient Hospital Stay (HOSPITAL_COMMUNITY)
Admission: EM | Admit: 2017-12-02 | Discharge: 2017-12-05 | DRG: 291 | Disposition: A | Discharge status: Home Health | Payer: Medicare Other | Attending: Family Medicine | Admitting: Family Medicine

## 2017-12-02 ENCOUNTER — Emergency Department (HOSPITAL_COMMUNITY): Payer: Medicare Other

## 2017-12-02 ENCOUNTER — Other Ambulatory Visit: Payer: Self-pay

## 2017-12-02 ENCOUNTER — Encounter (HOSPITAL_COMMUNITY): Payer: Self-pay

## 2017-12-02 DIAGNOSIS — I5031 Acute diastolic (congestive) heart failure: Secondary | ICD-10-CM | POA: Diagnosis not present

## 2017-12-02 DIAGNOSIS — I110 Hypertensive heart disease with heart failure: Secondary | ICD-10-CM | POA: Diagnosis not present

## 2017-12-02 DIAGNOSIS — Z87891 Personal history of nicotine dependence: Secondary | ICD-10-CM | POA: Diagnosis not present

## 2017-12-02 DIAGNOSIS — I10 Essential (primary) hypertension: Secondary | ICD-10-CM | POA: Diagnosis not present

## 2017-12-02 DIAGNOSIS — I340 Nonrheumatic mitral (valve) insufficiency: Secondary | ICD-10-CM | POA: Diagnosis not present

## 2017-12-02 DIAGNOSIS — I130 Hypertensive heart and chronic kidney disease with heart failure and stage 1 through stage 4 chronic kidney disease, or unspecified chronic kidney disease: Principal | ICD-10-CM | POA: Diagnosis present

## 2017-12-02 DIAGNOSIS — E11649 Type 2 diabetes mellitus with hypoglycemia without coma: Secondary | ICD-10-CM | POA: Diagnosis not present

## 2017-12-02 DIAGNOSIS — Z7982 Long term (current) use of aspirin: Secondary | ICD-10-CM | POA: Diagnosis not present

## 2017-12-02 DIAGNOSIS — Z85038 Personal history of other malignant neoplasm of large intestine: Secondary | ICD-10-CM | POA: Diagnosis not present

## 2017-12-02 DIAGNOSIS — R319 Hematuria, unspecified: Secondary | ICD-10-CM | POA: Diagnosis not present

## 2017-12-02 DIAGNOSIS — R2681 Unsteadiness on feet: Secondary | ICD-10-CM | POA: Diagnosis present

## 2017-12-02 DIAGNOSIS — Z7984 Long term (current) use of oral hypoglycemic drugs: Secondary | ICD-10-CM | POA: Diagnosis not present

## 2017-12-02 DIAGNOSIS — E1122 Type 2 diabetes mellitus with diabetic chronic kidney disease: Secondary | ICD-10-CM | POA: Diagnosis present

## 2017-12-02 DIAGNOSIS — Z79899 Other long term (current) drug therapy: Secondary | ICD-10-CM | POA: Diagnosis not present

## 2017-12-02 DIAGNOSIS — M1990 Unspecified osteoarthritis, unspecified site: Secondary | ICD-10-CM | POA: Diagnosis present

## 2017-12-02 DIAGNOSIS — E876 Hypokalemia: Secondary | ICD-10-CM | POA: Diagnosis not present

## 2017-12-02 DIAGNOSIS — R0602 Shortness of breath: Secondary | ICD-10-CM | POA: Diagnosis not present

## 2017-12-02 DIAGNOSIS — T68XXXA Hypothermia, initial encounter: Secondary | ICD-10-CM | POA: Diagnosis present

## 2017-12-02 DIAGNOSIS — Z8546 Personal history of malignant neoplasm of prostate: Secondary | ICD-10-CM

## 2017-12-02 DIAGNOSIS — N183 Chronic kidney disease, stage 3 (moderate): Secondary | ICD-10-CM | POA: Diagnosis present

## 2017-12-02 DIAGNOSIS — I509 Heart failure, unspecified: Secondary | ICD-10-CM

## 2017-12-02 DIAGNOSIS — E785 Hyperlipidemia, unspecified: Secondary | ICD-10-CM | POA: Diagnosis not present

## 2017-12-02 DIAGNOSIS — R0600 Dyspnea, unspecified: Secondary | ICD-10-CM | POA: Diagnosis not present

## 2017-12-02 DIAGNOSIS — I5033 Acute on chronic diastolic (congestive) heart failure: Secondary | ICD-10-CM | POA: Diagnosis present

## 2017-12-02 DIAGNOSIS — T68XXXD Hypothermia, subsequent encounter: Secondary | ICD-10-CM | POA: Diagnosis not present

## 2017-12-02 DIAGNOSIS — I447 Left bundle-branch block, unspecified: Secondary | ICD-10-CM | POA: Diagnosis not present

## 2017-12-02 DIAGNOSIS — I071 Rheumatic tricuspid insufficiency: Secondary | ICD-10-CM | POA: Diagnosis not present

## 2017-12-02 DIAGNOSIS — R05 Cough: Secondary | ICD-10-CM | POA: Diagnosis not present

## 2017-12-02 LAB — BASIC METABOLIC PANEL
Anion gap: 11 (ref 5–15)
BUN: 21 mg/dL — ABNORMAL HIGH (ref 6–20)
CHLORIDE: 109 mmol/L (ref 101–111)
CO2: 23 mmol/L (ref 22–32)
CREATININE: 1.55 mg/dL — AB (ref 0.61–1.24)
Calcium: 9.5 mg/dL (ref 8.9–10.3)
GFR calc Af Amer: 43 mL/min — ABNORMAL LOW (ref >60)
GFR calc non Af Amer: 37 mL/min — ABNORMAL LOW (ref >60)
GLUCOSE: 113 mg/dL — AB (ref 65–99)
Potassium: 3.7 mmol/L (ref 3.5–5.1)
SODIUM: 143 mmol/L (ref 135–145)

## 2017-12-02 LAB — URINALYSIS, ROUTINE W REFLEX MICROSCOPIC
BILIRUBIN URINE: NEGATIVE
Bacteria, UA: NONE SEEN
Glucose, UA: 50 mg/dL — AB
Ketones, ur: NEGATIVE mg/dL
LEUKOCYTES UA: NEGATIVE
Nitrite: NEGATIVE
PH: 5 (ref 5.0–8.0)
Protein, ur: >=300 mg/dL — AB
SPECIFIC GRAVITY, URINE: 1.011 (ref 1.005–1.030)
Squamous Epithelial / LPF: NONE SEEN

## 2017-12-02 LAB — CBC WITH DIFFERENTIAL/PLATELET
Basophils Absolute: 0 10*3/uL (ref 0.0–0.1)
Basophils Relative: 0 %
EOS ABS: 0.1 10*3/uL (ref 0.0–0.7)
Eosinophils Relative: 1 %
HEMATOCRIT: 32.3 % — AB (ref 39.0–52.0)
HEMOGLOBIN: 11.4 g/dL — AB (ref 13.0–17.0)
LYMPHS ABS: 0.6 10*3/uL — AB (ref 0.7–4.0)
LYMPHS PCT: 13 %
MCH: 29.2 pg (ref 26.0–34.0)
MCHC: 35.3 g/dL (ref 30.0–36.0)
MCV: 82.6 fL (ref 78.0–100.0)
MONOS PCT: 6 %
Monocytes Absolute: 0.3 10*3/uL (ref 0.1–1.0)
NEUTROS PCT: 80 %
Neutro Abs: 4.1 10*3/uL (ref 1.7–7.7)
Platelets: 160 10*3/uL (ref 150–400)
RBC: 3.91 MIL/uL — ABNORMAL LOW (ref 4.22–5.81)
RDW: 16.2 % — ABNORMAL HIGH (ref 11.5–15.5)
WBC: 5.1 10*3/uL (ref 4.0–10.5)

## 2017-12-02 LAB — INFLUENZA PANEL BY PCR (TYPE A & B)
INFLBPCR: NEGATIVE
Influenza A By PCR: NEGATIVE

## 2017-12-02 LAB — I-STAT CG4 LACTIC ACID, ED: LACTIC ACID, VENOUS: 1.41 mmol/L (ref 0.5–1.9)

## 2017-12-02 LAB — BRAIN NATRIURETIC PEPTIDE: B Natriuretic Peptide: 368.5 pg/mL — ABNORMAL HIGH (ref 0.0–100.0)

## 2017-12-02 LAB — I-STAT TROPONIN, ED: Troponin i, poc: 0.01 ng/mL (ref 0.00–0.08)

## 2017-12-02 MED ORDER — ACETAMINOPHEN 325 MG PO TABS: 650.0000 mg | ORAL_TABLET | ORAL | Status: DC | PRN

## 2017-12-02 MED ORDER — ONDANSETRON HCL 4 MG/2ML IJ SOLN: 4.0000 mg | Freq: Four times a day (QID) | INTRAMUSCULAR | Status: DC | PRN

## 2017-12-02 MED ORDER — ALBUTEROL SULFATE (2.5 MG/3ML) 0.083% IN NEBU: 5.0000 mg | INHALATION_SOLUTION | Freq: Once | RESPIRATORY_TRACT | Status: DC

## 2017-12-02 MED ORDER — FUROSEMIDE 10 MG/ML IJ SOLN
40.0000 mg | Freq: Every day | INTRAMUSCULAR | Status: DC
  Administered 2017-12-03: 40 mg via INTRAVENOUS
  Filled 2017-12-02 (×2): qty 4

## 2017-12-02 MED ORDER — SODIUM CHLORIDE 0.9 % IV BOLUS (SEPSIS): 500.0000 mL | Freq: Once | INTRAVENOUS | Status: DC

## 2017-12-02 MED ORDER — CITALOPRAM HYDROBROMIDE 20 MG PO TABS
10.0000 mg | ORAL_TABLET | Freq: Every day | ORAL | Status: DC
  Administered 2017-12-03 – 2017-12-05 (×3): 10 mg via ORAL
  Filled 2017-12-02 (×3): qty 1

## 2017-12-02 MED ORDER — SODIUM CHLORIDE 0.9% FLUSH
3.0000 mL | Freq: Two times a day (BID) | INTRAVENOUS | Status: DC
  Administered 2017-12-03: 01:00:00 via INTRAVENOUS
  Administered 2017-12-03 – 2017-12-04 (×3): 3 mL via INTRAVENOUS

## 2017-12-02 MED ORDER — ASPIRIN EC 81 MG PO TBEC
81.0000 mg | DELAYED_RELEASE_TABLET | Freq: Every day | ORAL | Status: DC
  Administered 2017-12-03 – 2017-12-05 (×3): 81 mg via ORAL
  Filled 2017-12-02 (×3): qty 1

## 2017-12-02 MED ORDER — AMLODIPINE BESYLATE 10 MG PO TABS
10.0000 mg | ORAL_TABLET | Freq: Every day | ORAL | Status: DC
  Administered 2017-12-03 – 2017-12-05 (×3): 10 mg via ORAL
  Filled 2017-12-02: qty 1
  Filled 2017-12-02: qty 2
  Filled 2017-12-02: qty 1

## 2017-12-02 MED ORDER — SODIUM CHLORIDE 0.9% FLUSH: 3.0000 mL | INTRAVENOUS | Status: DC | PRN

## 2017-12-02 MED ORDER — SODIUM CHLORIDE 0.9 % IV SOLN: 250.0000 mL | INTRAVENOUS | Status: DC | PRN

## 2017-12-02 MED ORDER — FUROSEMIDE 10 MG/ML IJ SOLN
40.0000 mg | Freq: Once | INTRAMUSCULAR | Status: AC
  Administered 2017-12-03: 40 mg via INTRAVENOUS
  Filled 2017-12-02: qty 4

## 2017-12-02 MED ORDER — INSULIN ASPART 100 UNIT/ML ~~LOC~~ SOLN
0.0000 [IU] | Freq: Three times a day (TID) | SUBCUTANEOUS | Status: DC
  Administered 2017-12-05: 1 [IU] via SUBCUTANEOUS

## 2017-12-02 MED ORDER — IPRATROPIUM-ALBUTEROL 0.5-2.5 (3) MG/3ML IN SOLN: 3.0000 mL | Freq: Once | RESPIRATORY_TRACT | Status: DC

## 2017-12-02 NOTE — ED Notes (Signed)
Below order not completed by EW. 

## 2017-12-02 NOTE — ED Triage Notes (Signed)
Patient c/o SOB and a productive cough with white sputum since last night.

## 2017-12-02 NOTE — ED Notes (Signed)
ED TO INPATIENT HANDOFF REPORT  Name/Age/Gender [name] 84 y.o. male  Code Status    Code Status Orders  (From admission, onward)        Start     Ordered   12/02/17 2324  Full code  Continuous     12/02/17 2324    Code Status History    Date Active Date Inactive Code Status Order ID Comments User Context   05/20/2014 17:44 05/22/2014 16:49 Full Code [id number]  Reyne Dumas, MD Inpatient      Home/SNF/Other Home  Chief Complaint shortness of breath, couching  Level of Care/Admitting Diagnosis ED Disposition    ED Disposition Condition [facility] Area: [facility] [100102]  Level of Care: Med-Surg [16]  Diagnosis: CHF (congestive heart failure) [facility] [413244]  Admitting Physician: Phillips Grout [[id number]]  Attending Physician: Derrill Kay A [[id number]]  PT Class (Do Not Modify): Observation [104]  PT Acc Code (Do Not Modify): Observation [10022]       Medical History Past Medical History:  Diagnosis Date  . Adenomatous polyps 09/09/2008  . Arthritis   . Carpal tunnel syndrome, bilateral 11/21/2016  . Colon cancer ([place]) dx'd 2008   surg only  . Diabetes mellitus without complication ([place])   . Falls frequently   . Hypertension   . Prostate cancer ([place]) dx'd 1993   surg only    Allergies No Known Allergies  IV Location/Drains/Wounds Patient Lines/Drains/Airways Status   Active Line/Drains/Airways    Name:   Placement date:   Placement time:   Site:   Days:   Peripheral IV 12/02/17 Right;Upper Arm   12/02/17    2038    Arm   less than 1          Labs/Imaging Results for orders placed or performed during the hospital encounter of 12/02/17 (from the past 48 hour(s))  CBC with Differential     Status: Abnormal   Collection Time: 12/02/17  7:40 PM  Result Value Ref Range   WBC 5.1 4.0 - 10.5 K/uL   RBC 3.91 (L) 4.22 - 5.81 MIL/uL   Hemoglobin 11.4 (L) 13.0 - 17.0 g/dL   HCT 32.3 (L) 39.0 - 52.0 %   MCV 82.6 78.0  - 100.0 fL   MCH 29.2 26.0 - 34.0 pg   MCHC 35.3 30.0 - 36.0 g/dL   RDW 16.2 (H) 11.5 - 15.5 %   Platelets 160 150 - 400 K/uL   Neutrophils Relative % 80 %   Neutro Abs 4.1 1.7 - 7.7 K/uL   Lymphocytes Relative 13 %   Lymphs Abs 0.6 (L) 0.7 - 4.0 K/uL   Monocytes Relative 6 %   Monocytes Absolute 0.3 0.1 - 1.0 K/uL   Eosinophils Relative 1 %   Eosinophils Absolute 0.1 0.0 - 0.7 K/uL   Basophils Relative 0 %   Basophils Absolute 0.0 0.0 - 0.1 K/uL    Comment: Performed at [facility], [place] [address] [place], [place] [address]  Basic metabolic panel     Status: Abnormal   Collection Time: 12/02/17  7:40 PM  Result Value Ref Range   Sodium 143 135 - 145 mmol/L   Potassium 3.7 3.5 - 5.1 mmol/L   Chloride 109 101 - 111 mmol/L   CO2 23 22 - 32 mmol/L   Glucose, Bld 113 (H) 65 - 99 mg/dL   BUN 21 (H) 6 - 20 mg/dL   Creatinine, Ser 1.55 (H) 0.61 -  1.24 mg/dL   Calcium 9.5 8.9 - 10.3 mg/dL   GFR calc non Af Amer 37 (L) >60 mL/min   GFR calc Af Amer 43 (L) >60 mL/min    Comment: (NOTE) The eGFR has been calculated using the CKD EPI equation. This calculation has not been validated in all clinical situations. eGFR's persistently <60 mL/min signify possible Chronic Kidney Disease.    Anion gap 11 5 - 15    Comment: Performed at [facility], [place] [address], [place], [place] [address]  Brain natriuretic peptide     Status: Abnormal   Collection Time: 12/02/17  7:40 PM  Result Value Ref Range   B Natriuretic Peptide 368.5 (H) 0.0 - 100.0 pg/mL    Comment: Performed at [facility], [place] [address], [place], [place] [address]  Influenza panel by PCR (type A & B)     Status: None   Collection Time: 12/02/17  7:40 PM  Result Value Ref Range   Influenza A By PCR NEGATIVE NEGATIVE   Influenza B By PCR NEGATIVE NEGATIVE    Comment: (NOTE) The Xpert Xpress Flu assay is intended as an aid in the diagnosis of  influenza and should  not be used as a sole basis for treatment.  This  assay is FDA approved for nasopharyngeal swab specimens only. Nasal  washings and aspirates are unacceptable for Xpert Xpress Flu testing. Performed at [facility], [place] [address], [place], [place] [address]   I-Stat Troponin, ED (not at [facility])     Status: None   Collection Time: 12/02/17  8:38 PM  Result Value Ref Range   Troponin i, poc 0.01 0.00 - 0.08 ng/mL   Comment 3            Comment: Due to the release kinetics of cTnI, a negative result within the first hours of the onset of symptoms does not rule out myocardial infarction with certainty. If myocardial infarction is still suspected, repeat the test at appropriate intervals.   I-Stat CG4 Lactic Acid, ED     Status: None   Collection Time: 12/02/17  8:40 PM  Result Value Ref Range   Lactic Acid, Venous 1.41 0.5 - 1.9 mmol/L  Urinalysis, Routine w reflex microscopic (not at [facility])     Status: Abnormal   Collection Time: 12/02/17  8:59 PM  Result Value Ref Range   Color, Urine YELLOW YELLOW   APPearance HAZY (A) CLEAR   Specific Gravity, Urine 1.011 1.005 - 1.030   pH 5.0 5.0 - 8.0   Glucose, UA 50 (A) NEGATIVE mg/dL   Hgb urine dipstick LARGE (A) NEGATIVE   Bilirubin Urine NEGATIVE NEGATIVE   Ketones, ur NEGATIVE NEGATIVE mg/dL   Protein, ur >=300 (A) NEGATIVE mg/dL   Nitrite NEGATIVE NEGATIVE   Leukocytes, UA NEGATIVE NEGATIVE   RBC / HPF TOO NUMEROUS TO COUNT 0 - 5 RBC/hpf   WBC, UA 0-5 0 - 5 WBC/hpf   Bacteria, UA NONE SEEN NONE SEEN   Squamous Epithelial / LPF NONE SEEN NONE SEEN   Mucus PRESENT     Comment: Performed at [facility], [place] [address] [place], [place] [address]   Dg Chest 2 View  Result Date: 12/02/2017 CLINICAL DATA:  Shortness of breath, productive cough. EXAM: CHEST  2 VIEW COMPARISON:  Radiographs of October 23, 2017. FINDINGS: Stable cardiomediastinal silhouette with central pulmonary vascular congestion is  noted. No pneumothorax is noted. Minimal right pleural effusion  is noted. Minimal bibasilar subsegmental atelectasis is noted. Bony thorax is unremarkable. IMPRESSION: Stable central pulmonary vascular congestion. Minimal right pleural effusion. Minimal bibasilar subsegmental atelectasis. Electronically Signed   By: Marijo Conception, M.D.   On: 12/02/2017 18:29    Pending Labs Unresulted Labs (From admission, onward)   Start     Ordered   12/03/17 2694  Basic metabolic panel  Daily,   R     12/02/17 2324   12/02/17 1950  Blood culture (routine x 2)  BLOOD CULTURE X 2,   STAT     12/02/17 1949   12/02/17 1950  Urine culture  STAT,   STAT     12/02/17 1950      Vitals/Pain Today's Vitals   12/02/17 2200 12/02/17 2224 12/02/17 2230 12/02/17 2300  BP: (!) 160/67 (!) 160/67 (!) 157/76 (!) 166/66  Pulse: 70 68 70 64  Resp: '20 14 17 12  '$ Temp:      TempSrc:      SpO2: 98% 96% 95% 96%  Weight:      Height:      PainSc:        Isolation Precautions No active isolations  Medications Medications  furosemide (LASIX) injection 40 mg (not administered)  amLODipine (NORVASC) tablet 10 mg (not administered)  aspirin EC tablet 81 mg (not administered)  citalopram (CELEXA) tablet 10 mg (not administered)  sodium chloride flush (NS) 0.9 % injection 3 mL (not administered)  sodium chloride flush (NS) 0.9 % injection 3 mL (not administered)  0.9 %  sodium chloride infusion (not administered)  acetaminophen (TYLENOL) tablet 650 mg (not administered)  ondansetron (ZOFRAN) injection 4 mg (not administered)  furosemide (LASIX) injection 40 mg (not administered)  insulin aspart (novoLOG) injection 0-9 Units (not administered)    Mobility walks

## 2017-12-02 NOTE — ED Provider Notes (Signed)
[facility] DEPT Provider Note   CSN: [id number] Arrival date & time: 12/02/17  1720     History   Chief Complaint Chief Complaint  Patient presents with  . Shortness of Breath    HPI [name] is a 84 y.o. male.  HPI [name] is a 85 y.o. male with history of hypertension, diabetes, presents to emergency department complaining of shortness of breath.  Patient states that he started having productive cough onset this morning.  States is difficult for him to catch a deep breath.  He reports chest pain but only with coughing.  Reports generalized weakness.  Denies increased swelling in extremities.  Has taken cough drops for his cough with no relief.  No other medication taken.  Was seen several days ago for shortness of breath, at that time patient states that he was told it was his anxiety, and states that he had no cough then.  He denies any known fever or chills.  He reports that his family members has had cold symptoms as well.  Denies any nausea or vomiting.  No diarrhea.  No abdominal pain.  No urinary symptoms.  Past Medical History:  Diagnosis Date  . Adenomatous polyps 09/09/2008  . Arthritis   . Carpal tunnel syndrome, bilateral 11/21/2016  . Colon cancer ([place]) dx'd 2008   surg only  . Diabetes mellitus without complication ([place])   . Falls frequently   . Hypertension   . Prostate cancer ([place]) dx'd 1993   surg only    Patient Active Problem List   Diagnosis Date Noted  . Carpal tunnel syndrome, bilateral 11/21/2016  . Falls frequently   . Vertigo 05/20/2014  . PROSTATE CANCER 08/19/2008  . DM 08/19/2008  . HYPERLIPIDEMIA 08/19/2008  . HYPERTENSION 08/19/2008  . Personal history of colon cancer, stage II 08/19/2008    Past Surgical History:  Procedure Laterality Date  . CATARACT EXTRACTION W/PHACO Left 06/10/2013   Procedure: CATARACT EXTRACTION PHACO AND INTRAOCULAR LENS PLACEMENT (IOC);  Surgeon: Adonis Brook, MD;   Location: [facility];  Service: Ophthalmology;  Laterality: Left;  . COLONOSCOPY    . ESOPHAGOGASTRODUODENOSCOPY    . EYE SURGERY     cataract surgery, right eye  . laparoscopic assisted right hemicolectomy  09/21/07       Home Medications    Prior to Admission medications   Medication Sig Start Date End Date Taking? Authorizing Provider  amLODipine (NORVASC) 10 MG tablet Take 10 mg by mouth daily.    [provider]  aspirin EC 81 MG tablet Take 1 tablet (81 mg total) by mouth daily. 05/22/14   Reyne Dumas, MD  ENTRESTO 24-26 MG Take 1 tablet by mouth 2 (two) times daily. 08/01/17   [provider]  furosemide (LASIX) 20 MG tablet Take 0.5 tablets (10 mg total) by mouth daily. For 2-3 days. 10/24/17   Margarita Mail, PA-C  HYDROcodone-acetaminophen (NORCO/VICODIN) 5-325 MG per tablet Take 1-2 tablets by mouth every 6 hours as needed for pain and/or cough. Patient not taking: Reported on 10/23/2017 04/20/15   Pisciotta, Elmyra Ricks, PA-C  meclizine (ANTIVERT) 25 MG tablet Take 1 tablet (25 mg total) by mouth 3 (three) times daily as needed for dizziness. Patient not taking: Reported on 10/23/2017 05/22/14   Reyne Dumas, MD  pioglitazone (ACTOS) 15 MG tablet Take 15 mg by mouth daily.    [provider]  potassium chloride SA (K-DUR,KLOR-CON) 20 MEQ tablet Take 1 tablet (20 mEq total) by mouth daily.  Patient not taking: Reported on 10/23/2017 04/20/15   Pisciotta, Charna Elizabeth  XTANDI 40 MG capsule Take 40 mg by mouth daily. 10/01/17   [provider]    Family History Family History  Problem Relation Age of Onset  . Diabetes Unknown     Social History Social History   Tobacco Use  . Smoking status: Former Smoker    Years: 1.00    Types: Cigarettes    Last attempt to quit: 06/10/1983    Years since quitting: 34.5  . Smokeless tobacco: Never Used  Substance Use Topics  . Alcohol use: Yes    Comment: occasional wine  . Drug use: No     Allergies     Patient has no known allergies.   Review of Systems Review of Systems  Constitutional: Negative for chills and fever.  HENT: Positive for congestion and sore throat.   Respiratory: Positive for cough, chest tightness and shortness of breath.   Cardiovascular: Positive for chest pain. Negative for palpitations and leg swelling.  Gastrointestinal: Negative for abdominal distention, abdominal pain, diarrhea, nausea and vomiting.  Genitourinary: Negative for dysuria, frequency, hematuria and urgency.  Musculoskeletal: Negative for arthralgias, myalgias, neck pain and neck stiffness.  Skin: Negative for rash.  Allergic/Immunologic: Negative for immunocompromised state.  Neurological: Negative for dizziness, weakness, light-headedness, numbness and headaches.  All other systems reviewed and are negative.    Physical Exam Updated Vital Signs BP (!) 162/89 (BP Location: Left Arm)   Pulse 67   Temp (!) 95.8 F (35.4 C) (Axillary)   Resp 15   Ht 5\' 6"  (1.676 m)   Wt 96.6 kg (213 lb)   SpO2 100%   BMI 34.38 kg/m   Physical Exam  Constitutional: He appears well-developed and well-nourished. No distress.  HENT:  Head: Normocephalic and atraumatic.  Eyes: Conjunctivae are normal.  Neck: Neck supple.  Cardiovascular: Normal rate, regular rhythm and normal heart sounds.  Pulmonary/Chest: Effort normal. No respiratory distress. He has no wheezes. He has rales.  Rales at bases. Decreased air movement bilaterally.  Patient is coughing.  Abdominal: Soft. Bowel sounds are normal. He exhibits no distension. There is no tenderness. There is no rebound.  Musculoskeletal:       Right lower leg: He exhibits edema.  Neurological: He is alert.  Skin: Skin is warm and dry.  Nursing note and vitals reviewed.    ED Treatments / Results  Labs (all labs ordered are listed, but only abnormal results are displayed) Labs Reviewed  CBC WITH DIFFERENTIAL/PLATELET - Abnormal; Notable for the  following components:      Result Value   RBC 3.91 (*)    Hemoglobin 11.4 (*)    HCT 32.3 (*)    RDW 16.2 (*)    Lymphs Abs 0.6 (*)    All other components within normal limits  BASIC METABOLIC PANEL - Abnormal; Notable for the following components:   Glucose, Bld 113 (*)    BUN 21 (*)    Creatinine, Ser 1.55 (*)    GFR calc non Af Amer 37 (*)    GFR calc Af Amer 43 (*)    All other components within normal limits  BRAIN NATRIURETIC PEPTIDE - Abnormal; Notable for the following components:   B Natriuretic Peptide 368.5 (*)    All other components within normal limits  URINALYSIS, ROUTINE W REFLEX MICROSCOPIC - Abnormal; Notable for the following components:   APPearance HAZY (*)    Glucose, UA 50 (*)  Hgb urine dipstick LARGE (*)    Protein, ur >=300 (*)    All other components within normal limits  CULTURE, BLOOD (ROUTINE X 2)  CULTURE, BLOOD (ROUTINE X 2)  URINE CULTURE  INFLUENZA PANEL BY PCR (TYPE A & B)  I-STAT TROPONIN, ED  I-STAT CG4 LACTIC ACID, ED  I-STAT CG4 LACTIC ACID, ED    EKG  EKG Interpretation  Date/Time:  Monday December 02 2017 17:50:47 EST Ventricular Rate:  63 PR Interval:    QRS Duration: 144 QT Interval:  460 QTC Calculation: 471 R Axis:   50 Text Interpretation:  Sinus rhythm Ventricular premature complex Left bundle branch block No significant change since last tracing Confirmed by Duffy Bruce [phone]) on 12/02/2017 8:16:34 PM       Radiology Dg Chest 2 View  Result Date: 12/02/2017 CLINICAL DATA:  Shortness of breath, productive cough. EXAM: CHEST  2 VIEW COMPARISON:  Radiographs of October 23, 2017. FINDINGS: Stable cardiomediastinal silhouette with central pulmonary vascular congestion is noted. No pneumothorax is noted. Minimal right pleural effusion is noted. Minimal bibasilar subsegmental atelectasis is noted. Bony thorax is unremarkable. IMPRESSION: Stable central pulmonary vascular congestion. Minimal right pleural effusion.  Minimal bibasilar subsegmental atelectasis. Electronically Signed   By: Marijo Conception, M.D.   On: 12/02/2017 18:29    Procedures Procedures (including critical care time)  Medications Ordered in ED Medications  ipratropium-albuterol (DUONEB) 0.5-2.5 (3) MG/3ML nebulizer solution 3 mL (not administered)     Initial Impression / Assessment and Plan / ED Course  I have reviewed the triage vital signs and the nursing notes.  Pertinent labs & imaging results that were available during my care of the patient were reviewed by me and considered in my medical decision making (see chart for details).     Patient in emergency department with new onset of cough this morning, with associated shortness of breath and chest pain that is only there when he is coughing.  Patient appears to be uncomfortable, actively coughing during my no wheezing or rhonchi.  No rales.  Will try a breathing treatment.  Will get x-ray and labs.\  8:22 PM Patient is found to be hypothermic with rectal temperature 95.4.  Placed on [facility].  This is concerning for possible sepsis.  Will add blood cultures and lactic acid.  Labs including influenza panel pending at this time.  10:50 PM Patient's chest x-ray shows stable pulmonary vascular congestion with a small right pleural effusion.  BNP slightly elevated.  Normal troponin, normal lactic acid, CBC and [place] at baseline.  I discussed with Dr. Ellender Hose who has seen patient as well.  Patient with bibasilar rales on exam, question whether patient could be fluid overloaded which could explain his shortness of breath.  I will give a dose of Lasix in the ED.  Will recheck rectal temperature.  I tried to get patient up and ambulated with pulse ox, however he states he is too weak to walk.  Will admit for diuresis and observation.  Vitals:   12/02/17 1728 12/02/17 1937 12/02/17 1947 12/02/17 2224  BP: (!) 165/63 (!) 162/89  (!) 160/67  Pulse: 66 67  68  Resp: (!) 24 15  14     Temp: (!) 95.8 F (35.4 C)  (!) 95.4 F (35.2 C)   TempSrc: Axillary  Rectal   SpO2: 99% 100%  96%  Weight: 96.6 kg (213 lb)     Height: 5\' 6"  (1.676 m)  Final Clinical Impressions(s) / ED Diagnoses   Final diagnoses:  Shortness of breath  Congestive heart failure, unspecified HF chronicity, unspecified heart failure type [facility])    ED Discharge Orders    None       Jeannett Senior, PA-C 12/02/17 2300    Duffy Bruce, MD 12/03/17 [phone]

## 2017-12-02 NOTE — H&P (Signed)
History and Physical    Praveen Defina TGY:[id number] DOB: 09/12/1924 DOA: 12/02/2017  PCP: Lucianne Lei, MD  Patient coming from: Home Chief Complaint: Shortness of breath and swelling  HPI: [name] is a 84 y.o. male with medical history significant of hypertension, diabetes recently seen in the emergency department for shortness of breath found to be in CHF and placed on Lasix 10 mg p.o. daily.  Patient reports he has been taking this but is not been helping.  He is getting worse with more shortness of breath and more swelling in his legs.  His last echo was in 2015 showed a EF of 50%.  Patient denies any fevers.  He denies any coughing.  His temperature is a little low in the emergency department at 95 however he is mentating normally.  His lactic acid level is normal.  Patient is referred for admission for CHF exacerbation.  Review of Systems: As per HPI otherwise 10 point review of systems negative.   Past Medical History:  Diagnosis Date  . Adenomatous polyps 09/09/2008  . Arthritis   . Carpal tunnel syndrome, bilateral 11/21/2016  . Colon cancer ([place]) dx'd 2008   surg only  . Diabetes mellitus without complication ([place])   . Falls frequently   . Hypertension   . Prostate cancer ([place]) dx'd 1993   surg only    Past Surgical History:  Procedure Laterality Date  . CATARACT EXTRACTION W/PHACO Left 06/10/2013   Procedure: CATARACT EXTRACTION PHACO AND INTRAOCULAR LENS PLACEMENT (IOC);  Surgeon: Adonis Brook, MD;  Location: [facility];  Service: Ophthalmology;  Laterality: Left;  . COLONOSCOPY    . ESOPHAGOGASTRODUODENOSCOPY    . EYE SURGERY     cataract surgery, right eye  . laparoscopic assisted right hemicolectomy  09/21/07     reports that he quit smoking about 34 years ago. His smoking use included cigarettes. He quit after 1.00 year of use. he has never used smokeless tobacco. He reports that he drinks alcohol. He reports that he does not use drugs.  No Known  Allergies  Family History  Problem Relation Age of Onset  . Diabetes Unknown     Prior to Admission medications   Medication Sig Start Date End Date Taking? Authorizing Provider  amLODipine (NORVASC) 10 MG tablet Take 10 mg by mouth daily.   Yes [provider]  aspirin EC 81 MG tablet Take 1 tablet (81 mg total) by mouth daily. 05/22/14  Yes Reyne Dumas, MD  citalopram (CELEXA) 10 MG tablet Take 10 mg by mouth daily. 11/24/17  Yes [provider]  pioglitazone (ACTOS) 15 MG tablet Take 15 mg by mouth daily.   Yes [provider]  XTANDI 40 MG capsule Take 40 mg by mouth daily. 10/01/17  Yes [provider]  furosemide (LASIX) 20 MG tablet Take 0.5 tablets (10 mg total) by mouth daily. For 2-3 days. Patient not taking: Reported on 12/02/2017 10/24/17   Margarita Mail, PA-C  HYDROcodone-acetaminophen (NORCO/VICODIN) 5-325 MG per tablet Take 1-2 tablets by mouth every 6 hours as needed for pain and/or cough. Patient not taking: Reported on 10/23/2017 04/20/15   Pisciotta, Elmyra Ricks, PA-C  meclizine (ANTIVERT) 25 MG tablet Take 1 tablet (25 mg total) by mouth 3 (three) times daily as needed for dizziness. Patient not taking: Reported on 10/23/2017 05/22/14   Reyne Dumas, MD  potassium chloride SA (K-DUR,KLOR-CON) 20 MEQ tablet Take 1 tablet (20 mEq total) by mouth daily. Patient not taking: Reported on 10/23/2017 04/20/15  Monico Blitz, [place]    Physical Exam: Vitals:   12/02/17 2200 12/02/17 2224 12/02/17 2230 12/02/17 2300  BP: (!) 160/67 (!) 160/67 (!) 157/76 (!) 166/66  Pulse: 70 68 70 64  Resp: 20 14 17 12   Temp:      TempSrc:      SpO2: 98% 96% 95% 96%  Weight:      Height:          Constitutional: NAD, calm, comfortable Vitals:   12/02/17 2200 12/02/17 2224 12/02/17 2230 12/02/17 2300  BP: (!) 160/67 (!) 160/67 (!) 157/76 (!) 166/66  Pulse: 70 68 70 64  Resp: 20 14 17 12   Temp:      TempSrc:      SpO2: 98% 96% 95% 96%  Weight:       Height:       Eyes: PERRL, lids and conjunctivae normal ENMT: Mucous membranes are moist. Posterior pharynx clear of any exudate or lesions.Normal dentition.  Neck: normal, supple, no masses, no thyromegaly Respiratory: clear to auscultation bilaterally, no wheezing, no crackles he has rales on the right. Normal respiratory effort. No accessory muscle use.  Cardiovascular: Regular rate and rhythm, no murmurs / rubs / gallops.  1+ extremity edema. 2+ pedal pulses. No carotid bruits.  Abdomen: no tenderness, no masses palpated. No hepatosplenomegaly. Bowel sounds positive.  Musculoskeletal: no clubbing / cyanosis. No joint deformity upper and lower extremities. Good ROM, no contractures. Normal muscle tone.  Skin: no rashes, lesions, ulcers. No induration Neurologic: CN 2-12 grossly intact. Sensation intact, DTR normal. Strength 5/5 in all 4.  Psychiatric: Normal judgment and insight. Alert and oriented x 3. Normal mood.    Labs on Admission: I have personally reviewed following labs and imaging studies  CBC: Recent Labs  Lab 12/02/17 1940  WBC 5.1  NEUTROABS 4.1  HGB 11.4*  HCT 32.3*  MCV 82.6  PLT 827   Basic Metabolic Panel: Recent Labs  Lab 12/02/17 1940  NA 143  K 3.7  CL 109  CO2 23  GLUCOSE 113*  BUN 21*  CREATININE 1.55*  CALCIUM 9.5   GFR: Estimated Creatinine Clearance: 33.8 mL/min (A) (by C-G formula based on SCr of 1.55 mg/dL (H)). Liver Function Tests: No results for input(s): AST, ALT, ALKPHOS, BILITOT, PROT, ALBUMIN in the last 168 hours. No results for input(s): LIPASE, AMYLASE in the last 168 hours. No results for input(s): AMMONIA in the last 168 hours. Coagulation Profile: No results for input(s): INR, PROTIME in the last 168 hours. Cardiac Enzymes: No results for input(s): CKTOTAL, CKMB, CKMBINDEX, TROPONINI in the last 168 hours. BNP (last 3 results) No results for input(s): PROBNP in the last 8760 hours. HbA1C: No results for input(s): HGBA1C  in the last 72 hours. CBG: No results for input(s): GLUCAP in the last 168 hours. Lipid Profile: No results for input(s): CHOL, HDL, LDLCALC, TRIG, CHOLHDL, LDLDIRECT in the last 72 hours. Thyroid Function Tests: No results for input(s): TSH, T4TOTAL, FREET4, T3FREE, THYROIDAB in the last 72 hours. Anemia Panel: No results for input(s): VITAMINB12, FOLATE, FERRITIN, TIBC, IRON, RETICCTPCT in the last 72 hours. Urine analysis:    Component Value Date/Time   COLORURINE YELLOW 12/02/2017 2059   APPEARANCEUR HAZY (A) 12/02/2017 2059   LABSPEC 1.011 12/02/2017 2059   PHURINE 5.0 12/02/2017 2059   GLUCOSEU 50 (A) 12/02/2017 2059   HGBUR LARGE (A) 12/02/2017 2059   BILIRUBINUR NEGATIVE 12/02/2017 2059   [place] NEGATIVE 12/02/2017 2059   PROTEINUR >=300 (A) 12/02/2017  2059   UROBILINOGEN 1.0 04/07/2010 1747   NITRITE NEGATIVE 12/02/2017 2059   LEUKOCYTESUR NEGATIVE 12/02/2017 2059   Sepsis Labs: !!!!!!!!!!!!!!!!!!!!!!!!!!!!!!!!!!!!!!!!!!!! @LABRCNTIP (procalcitonin:4,lacticidven:4) )No results found for this or any previous visit (from the past 240 hour(s)).   Radiological Exams on Admission: Dg Chest 2 View  Result Date: 12/02/2017 CLINICAL DATA:  Shortness of breath, productive cough. EXAM: CHEST  2 VIEW COMPARISON:  Radiographs of October 23, 2017. FINDINGS: Stable cardiomediastinal silhouette with central pulmonary vascular congestion is noted. No pneumothorax is noted. Minimal right pleural effusion is noted. Minimal bibasilar subsegmental atelectasis is noted. Bony thorax is unremarkable. IMPRESSION: Stable central pulmonary vascular congestion. Minimal right pleural effusion. Minimal bibasilar subsegmental atelectasis. Electronically Signed   By: Marijo Conception, M.D.   On: 12/02/2017 18:29    EKG: Independently reviewed. LBBB cxr reviewed with right pleural effusion  Assessment/Plan 84 year old male with acute CHF exacerbation  Principal Problem:   Acute CHF (congestive  heart failure) (HCC)-likely diastolic dysfunction.  Placed on Lasix 40 mg IV daily.  Monitor accurate I's and O's and daily weights.  Treat clinically.  Will not obtain cardiac echo and follow clinically.  Oxygen sats normal on room air.  Obtain physical therapy evaluation.  Active Problems:   Essential hypertension-continue home meds   Hypothermia-continue bear hugger   Dyspnea-due to the above Diabetes-holding oral agents and placing on sliding scale insulin for now check q. before meals and nightly sugar checks     DVT prophylaxis: SCDs Code Status: Full Family Communication: None Disposition Plan: Per day team Consults called: None Admission status: Observation   Adalina Dopson A MD [facility]  If 7PM-7AM, please contact night-coverage [email or web address] Password [facility]  12/02/2017, 11:21 PM

## 2017-12-02 NOTE — ED Triage Notes (Signed)
Unable to get a retake on the oral temp. Will need a rectal temp.

## 2017-12-02 NOTE — ED Notes (Signed)
Blanket warmer applied at medium setting, PA Tatyana aware

## 2017-12-03 ENCOUNTER — Observation Stay (HOSPITAL_BASED_OUTPATIENT_CLINIC_OR_DEPARTMENT_OTHER): Payer: Medicare Other

## 2017-12-03 DIAGNOSIS — Z7982 Long term (current) use of aspirin: Secondary | ICD-10-CM | POA: Diagnosis not present

## 2017-12-03 DIAGNOSIS — M1990 Unspecified osteoarthritis, unspecified site: Secondary | ICD-10-CM | POA: Diagnosis not present

## 2017-12-03 DIAGNOSIS — E785 Hyperlipidemia, unspecified: Secondary | ICD-10-CM | POA: Diagnosis not present

## 2017-12-03 DIAGNOSIS — E1122 Type 2 diabetes mellitus with diabetic chronic kidney disease: Secondary | ICD-10-CM | POA: Diagnosis not present

## 2017-12-03 DIAGNOSIS — I447 Left bundle-branch block, unspecified: Secondary | ICD-10-CM | POA: Diagnosis not present

## 2017-12-03 DIAGNOSIS — I340 Nonrheumatic mitral (valve) insufficiency: Secondary | ICD-10-CM

## 2017-12-03 DIAGNOSIS — R0600 Dyspnea, unspecified: Secondary | ICD-10-CM | POA: Diagnosis not present

## 2017-12-03 DIAGNOSIS — Z87891 Personal history of nicotine dependence: Secondary | ICD-10-CM | POA: Diagnosis not present

## 2017-12-03 DIAGNOSIS — Z7984 Long term (current) use of oral hypoglycemic drugs: Secondary | ICD-10-CM | POA: Diagnosis not present

## 2017-12-03 DIAGNOSIS — E11649 Type 2 diabetes mellitus with hypoglycemia without coma: Secondary | ICD-10-CM | POA: Diagnosis not present

## 2017-12-03 DIAGNOSIS — I5033 Acute on chronic diastolic (congestive) heart failure: Secondary | ICD-10-CM | POA: Diagnosis not present

## 2017-12-03 DIAGNOSIS — I130 Hypertensive heart and chronic kidney disease with heart failure and stage 1 through stage 4 chronic kidney disease, or unspecified chronic kidney disease: Secondary | ICD-10-CM | POA: Diagnosis not present

## 2017-12-03 DIAGNOSIS — T68XXXD Hypothermia, subsequent encounter: Secondary | ICD-10-CM | POA: Diagnosis not present

## 2017-12-03 DIAGNOSIS — I071 Rheumatic tricuspid insufficiency: Secondary | ICD-10-CM | POA: Diagnosis not present

## 2017-12-03 DIAGNOSIS — E876 Hypokalemia: Secondary | ICD-10-CM | POA: Diagnosis not present

## 2017-12-03 DIAGNOSIS — T68XXXA Hypothermia, initial encounter: Secondary | ICD-10-CM | POA: Diagnosis not present

## 2017-12-03 DIAGNOSIS — I10 Essential (primary) hypertension: Secondary | ICD-10-CM | POA: Diagnosis not present

## 2017-12-03 DIAGNOSIS — Z79899 Other long term (current) drug therapy: Secondary | ICD-10-CM | POA: Diagnosis not present

## 2017-12-03 DIAGNOSIS — I5031 Acute diastolic (congestive) heart failure: Secondary | ICD-10-CM | POA: Diagnosis not present

## 2017-12-03 DIAGNOSIS — N183 Chronic kidney disease, stage 3 (moderate): Secondary | ICD-10-CM | POA: Diagnosis not present

## 2017-12-03 DIAGNOSIS — Z85038 Personal history of other malignant neoplasm of large intestine: Secondary | ICD-10-CM | POA: Diagnosis not present

## 2017-12-03 DIAGNOSIS — R2681 Unsteadiness on feet: Secondary | ICD-10-CM | POA: Diagnosis not present

## 2017-12-03 DIAGNOSIS — R319 Hematuria, unspecified: Secondary | ICD-10-CM | POA: Diagnosis not present

## 2017-12-03 LAB — BASIC METABOLIC PANEL
ANION GAP: 12 (ref 5–15)
BUN: 20 mg/dL (ref 6–20)
CO2: 25 mmol/L (ref 22–32)
Calcium: 9.7 mg/dL (ref 8.9–10.3)
Chloride: 107 mmol/L (ref 101–111)
Creatinine, Ser: 1.67 mg/dL — ABNORMAL HIGH (ref 0.61–1.24)
GFR calc Af Amer: 40 mL/min — ABNORMAL LOW (ref >60)
GFR calc non Af Amer: 34 mL/min — ABNORMAL LOW (ref >60)
GLUCOSE: 97 mg/dL (ref 65–99)
POTASSIUM: 3.3 mmol/L — AB (ref 3.5–5.1)
Sodium: 144 mmol/L (ref 135–145)

## 2017-12-03 LAB — ECHOCARDIOGRAM COMPLETE
Height: 66 in
Weight: 3408 oz

## 2017-12-03 LAB — GLUCOSE, CAPILLARY
Glucose-Capillary: 86 mg/dL (ref 65–99)
Glucose-Capillary: 65 mg/dL (ref 65–99)
Glucose-Capillary: 133 mg/dL — ABNORMAL HIGH (ref 65–99)

## 2017-12-03 LAB — CBG MONITORING, ED
GLUCOSE-CAPILLARY: 80 mg/dL (ref 65–99)
GLUCOSE-CAPILLARY: 86 mg/dL (ref 65–99)

## 2017-12-03 MED ORDER — HEPARIN SODIUM (PORCINE) 5000 UNIT/ML IJ SOLN
5000.0000 [IU] | Freq: Three times a day (TID) | INTRAMUSCULAR | Status: DC
Start: 2017-12-03 — End: 2017-12-05
  Administered 2017-12-03 – 2017-12-05 (×7): 5000 [IU] via SUBCUTANEOUS
  Filled 2017-12-03 (×7): qty 1

## 2017-12-03 MED ORDER — PERFLUTREN LIPID MICROSPHERE
1.0000 mL | INTRAVENOUS | Status: AC | PRN
  Administered 2017-12-03: 1.5 mL via INTRAVENOUS
  Filled 2017-12-03: qty 10

## 2017-12-03 MED ORDER — PERFLUTREN LIPID MICROSPHERE
INTRAVENOUS | Status: AC
  Filled 2017-12-03: qty 10

## 2017-12-03 NOTE — Evaluation (Signed)
Physical Therapy Evaluation Patient Details Name: [name] MRN: [id number] DOB: 09-28-1924 Today's Date: 12/03/2017   History of Present Illness  [name] is a 84 y.o. male with medical history significant of hypertension, diabetes recently seen in the emergency department for shortness of breath found to be in CHF   Clinical Impression  Pt admitted with above diagnosis. Pt currently with functional limitations due to the deficits listed below (see PT Problem List).  Pt deconditioned and requiring min/mod assist with basic mobility, is at risk for falls; recommend SNF at this time, will continue to follow and assess for needs;  Pt will benefit from skilled PT to increase their independence and safety with mobility to allow discharge to the venue listed below.       Follow Up Recommendations SNF    Equipment Recommendations  None recommended by PT    Recommendations for Other Services       Precautions / Restrictions Precautions Precautions: Fall      Mobility  Bed Mobility Overal bed mobility: Needs Assistance Bed Mobility: Sit to Supine     Supine to sit: Mod assist     General bed mobility comments: assist with LEs onto bed, incr time needed and assist for positioning in supine  Transfers Overall transfer level: Needs assistance Equipment used: 2 person hand held assist Transfers: Sit to/from Stand Sit to Stand: +2 safety/equipment;+2 physical assistance;Min assist;Mod assist         General transfer comment: assist to rise and stabilize, pt requires bil HHA and support to maintain standing  Ambulation/Gait Ambulation/Gait assistance: +2 physical assistance;+2 safety/equipment;Min assist Ambulation Distance (Feet): 10 Feet Assistive device: 2 person hand held assist Gait Pattern/deviations: Shuffle;Trunk flexed;Narrow base of support     General Gait Details: PT did not have second RW (left our RW with previous ED pt) so bil HHA utilized; pt with heavy  reliance on UEs and requiring assist for balance, to safely wt shift and take steps to amb around bed  Stairs            Wheelchair Mobility    Modified Rankin (Stroke Patients Only)       Balance Overall balance assessment: Needs assistance Sitting-balance support: Feet supported;No upper extremity supported Sitting balance-Leahy Scale: Fair       Standing balance-Leahy Scale: Poor Standing balance comment: reliant on UE and external support                             Pertinent Vitals/Pain Pain Assessment: No/denies pain    Home Living Family/patient expects to be discharged to:: Private residence Living Arrangements: Children Available Help at Discharge: Available PRN/intermittently(pt reports lives iwthdtr and his dtr works) Type of Home: House Home Access: Level entry     Home Layout: One level Home Equipment: Environmental consultant - 2 wheels;Cane - single point Additional Comments: partial info taken from previous chart review; pt able to give hx although does not recall some details regarding home set up; family not present    Prior Function Level of Independence: Independent with assistive device(s);Independent   Gait / Transfers Assistance Needed: pt report he is IND household ambulator, uses his cane most of the time, sometimes RW           Hand Dominance        Extremity/Trunk Assessment   Upper Extremity Assessment Upper Extremity Assessment: Generalized weakness    Lower Extremity Assessment Lower Extremity Assessment: Generalized weakness  Communication      Cognition Arousal/Alertness: Awake/alert   Overall Cognitive Status: Impaired/Different from baseline Area of Impairment: Memory                               General Comments: pt is on EOB (with bedrails x4) and appears to be getting up by himself or just returning to bed after using urinal--pool of urine on floor and bed partially soaked with  urine; RN and  NT made aware      General Comments      Exercises     Assessment/Plan    PT Assessment Patient needs continued PT services  PT Problem List Decreased activity tolerance;Decreased balance;Decreased mobility;Decreased safety awareness;Decreased knowledge of use of DME       PT Treatment Interventions Gait training;Therapeutic activities;Therapeutic exercise;Patient/family education;Functional mobility training;DME instruction;Balance training    PT Goals (Current goals can be found in the Care Plan section)  Acute Rehab PT Goals Patient Stated Goal: feel better PT Goal Formulation: With patient Time For Goal Achievement: 12/10/17 Potential to Achieve Goals: Good    Frequency Min 3X/week   Barriers to discharge        Co-evaluation               AM-PAC PT "6 Clicks" Daily Activity  Outcome Measure Difficulty turning over in bed (including adjusting bedclothes, sheets and blankets)?: Unable Difficulty moving from lying on back to sitting on the side of the bed? : Unable Difficulty sitting down on and standing up from a chair with arms (e.g., wheelchair, bedside commode, etc,.)?: Unable Help needed moving to and from a bed to chair (including a wheelchair)?: A Lot Help needed walking in hospital room?: A Lot Help needed climbing 3-5 steps with a railing? : A Lot 6 Click Score: 9    End of Session Equipment Utilized During Treatment: Gait belt Activity Tolerance: Patient limited by fatigue Patient left: in bed;with call bell/phone within reach;with nursing/sitter in room   PT Visit Diagnosis: Unsteadiness on feet (R26.81);History of falling (Z91.81)    Time: 2330-0762 PT Time Calculation (min) (ACUTE ONLY): 12 min   Charges:   PT Evaluation $PT Eval Low Complexity: 1 Low     PT G CodesKenyon Webster, PT Pager: [phone] 12/03/2017   [facility] 12/03/2017, 1:12 PM

## 2017-12-03 NOTE — Progress Notes (Signed)
Hypoglycemic Event  CBG: 65  Treatment: Pt's dinner has arrived. Pt is sitting up and eating now.  Symptoms: none  Follow-up CBG: Time: 2150 CBG Result: 133  Possible Reasons for Event: Pt slept through meal time.  Pt stated he wanted to eat dinner about 9pm.   Comments/MD notified: Chaney Malling, NP notified @ 2056 of plan.     [name]

## 2017-12-03 NOTE — Progress Notes (Signed)
  Echocardiogram 2D Echocardiogram with Definity has been performed.  Darlina Sicilian M 12/03/2017, 9:24 AM

## 2017-12-03 NOTE — Progress Notes (Signed)
Patient arrived on unit from ED. No family at bedside.

## 2017-12-03 NOTE — Care Management Obs Status (Signed)
[facility] NOTIFICATION   Patient Details  Name: [name] MRN: [id number] Date of Birth: [birth year]   Medicare Observation Status Notification Given:  Yes    Ziara Thelander, Benjaman Lobe, RN 12/03/2017, 12:28 PM

## 2017-12-03 NOTE — ED Notes (Signed)
ECHO at bedside.

## 2017-12-03 NOTE — ED Notes (Signed)
Pt's CBG=86

## 2017-12-03 NOTE — Progress Notes (Signed)
PROGRESS NOTE  [name] CVE:[id number] DOB: 02-Feb-1924 DOA: 12/02/2017 PCP: Lucianne Lei, MD   LOS: 0 days   Brief Narrative / Interim history: 84 year old male with history of hypertension, diabetes, recently evaluated in the ED in January for shortness of breath found to be in mild CHF and started on Lasix.  Reports that he has been taking this medication but he is been getting more and more short of breath with progressive swelling in his legs.  He decided to come back to the ED and was admitted for acute on chronic diastolic CHF.  Assessment & Plan: Principal Problem:   Acute CHF (congestive heart failure) (HCC) Active Problems:   Essential hypertension   Hypothermia   Dyspnea   CHF (congestive heart failure) (HCC)  Acute on chronic diastolic CHF -2D echo as below, normal ejection fraction as well as grade 1 diastolic dysfunction -Patient was started on Lasix 40 mg IV daily, continue -Strict I's and O's, daily weights -PT evaluation pending -Chest x-ray on admission with central pulmonary vascular congestion and slight right-sided pleural effusion  Hypertension -continue Norvasc controlled 131/50 this morning, monitor with diuresis as well  Chronic kidney disease stage III -baseline creatinine 1.5-2.0 -Currently at bedside, continue to monitor given diuresis  Type 2 diabetes mellitus -Hold home oral agents, place on sliding scale, CBG is in the 80s-90s  Prostate cancer -Outpatient management, he is on Xtandi  Hypothermia -On admission, temperature now normal, unclear cause   DVT prophylaxis: Heparin Code Status: Full code Family Communication: No family at bedside Disposition Plan: Home versus SNF when ready, PT eval pending  Consultants:   None  Procedures:   2D echo:  Study Conclusions - Left ventricle: The cavity size was normal. There was moderate focal basal hypertrophy of the septum. Systolic function was normal. The estimated ejection fraction was  in the range of 60% to 65%. Wall motion was normal; there were no regional wall motion abnormalities. Doppler parameters are consistent with abnormal left ventricular relaxation (grade 1 diastolic dysfunction). - Aortic valve: Trileaflet; mildly thickened, mildly calcified leaflets. - Mitral valve: There was mild regurgitation. - Left atrium: The atrium was mildly dilated. - Right atrium: The atrium was mildly dilated. - Tricuspid valve: There was mild regurgitation. - Pulmonary arteries: Systolic pressure was mildly increased. PA peak pressure: 45 mm Hg (S).  Impressions: - Compared to the prior study, there has been no significant interval change.  Antimicrobials:  None    Subjective: - no chest pain, shortness of breath, no abdominal pain, nausea or vomiting.   Objective: Vitals:   12/03/17 0830 12/03/17 1010 12/03/17 1015 12/03/17 1130  BP: (!) 122/48 128/61 128/61 (!) 134/59  Pulse: 70  79 76  Resp: 18  18 16   Temp:   (!) 96.6 F (35.9 C)   TempSrc:   Rectal   SpO2: 100%  97% 97%  Weight:      Height:       No intake or output data in the 24 hours ending 12/03/17 1302 Filed Weights   12/02/17 1728  Weight: 96.6 kg (213 lb)    Examination:  Constitutional: NAD Eyes: lids and conjunctivae normal ENMT: Mucous membranes are moist. Neck: normal, supple Respiratory: Coarse breath sounds bilaterally, no wheezing, bibasilar crackles.  Normal respiratory effort Cardiovascular: Regular rate and rhythm, no murmurs / rubs / gallops.  1+ LE edema. 2+ pedal pulses.  Abdomen: no tenderness. Musculoskeletal: no clubbing / cyanosis.  Neurologic: non focal    Data Reviewed:  I have independently reviewed following labs and imaging studies   Chest x-ray -increased vascularity, right pleural effusion  CBC: Recent Labs  Lab 12/02/17 1940  WBC 5.1  NEUTROABS 4.1  HGB 11.4*  HCT 32.3*  MCV 82.6  PLT 086   Basic Metabolic Panel: Recent Labs  Lab 12/02/17 1940  12/03/17 0500  NA 143 144  K 3.7 3.3*  CL 109 107  CO2 23 25  GLUCOSE 113* 97  BUN 21* 20  CREATININE 1.55* 1.67*  CALCIUM 9.5 9.7   GFR: Estimated Creatinine Clearance: 31.3 mL/min (A) (by C-G formula based on SCr of 1.67 mg/dL (H)). Liver Function Tests: No results for input(s): AST, ALT, ALKPHOS, BILITOT, PROT, ALBUMIN in the last 168 hours. No results for input(s): LIPASE, AMYLASE in the last 168 hours. No results for input(s): AMMONIA in the last 168 hours. Coagulation Profile: No results for input(s): INR, PROTIME in the last 168 hours. Cardiac Enzymes: No results for input(s): CKTOTAL, CKMB, CKMBINDEX, TROPONINI in the last 168 hours. BNP (last 3 results) No results for input(s): PROBNP in the last 8760 hours. HbA1C: No results for input(s): HGBA1C in the last 72 hours. CBG: Recent Labs  Lab 12/03/17 0731 12/03/17 1140  GLUCAP 80 86   Lipid Profile: No results for input(s): CHOL, HDL, LDLCALC, TRIG, CHOLHDL, LDLDIRECT in the last 72 hours. Thyroid Function Tests: No results for input(s): TSH, T4TOTAL, FREET4, T3FREE, THYROIDAB in the last 72 hours. Anemia Panel: No results for input(s): VITAMINB12, FOLATE, FERRITIN, TIBC, IRON, RETICCTPCT in the last 72 hours. Urine analysis:    Component Value Date/Time   COLORURINE YELLOW 12/02/2017 2059   APPEARANCEUR HAZY (A) 12/02/2017 2059   LABSPEC 1.011 12/02/2017 2059   PHURINE 5.0 12/02/2017 2059   GLUCOSEU 50 (A) 12/02/2017 2059   HGBUR LARGE (A) 12/02/2017 2059   BILIRUBINUR NEGATIVE 12/02/2017 2059   KETONESUR NEGATIVE 12/02/2017 2059   PROTEINUR >=300 (A) 12/02/2017 2059   UROBILINOGEN 1.0 04/07/2010 1747   NITRITE NEGATIVE 12/02/2017 2059   LEUKOCYTESUR NEGATIVE 12/02/2017 2059   Sepsis Labs: Invalid input(s): PROCALCITONIN, LACTICIDVEN  Recent Results (from the past 240 hour(s))  Blood culture (routine x 2)     Status: None (Preliminary result)   Collection Time: 12/02/17  7:50 PM  Result Value Ref  Range Status   Specimen Description   Final    BLOOD RIGHT ARM Performed at [facility] [address] [place], [place] [address]    Special Requests   Final    BOTTLES DRAWN AEROBIC AND ANAEROBIC Blood Culture adequate volume Performed at [facility] [address], [place], [place] [address]    Culture   Final    NO GROWTH < 12 HOURS Performed at [facility] [address], [place], [place] [address]    Report Status PENDING  Incomplete  Blood culture (routine x 2)     Status: None (Preliminary result)   Collection Time: 12/02/17  8:00 PM  Result Value Ref Range Status   Specimen Description   Final    BLOOD LEFT FOREARM Performed at [facility] [address], [place], [place] [address]    Special Requests   Final    BOTTLES DRAWN AEROBIC AND ANAEROBIC Blood Culture adequate volume Performed at [facility] [address] [place], [place] [address]    Culture   Final    NO GROWTH < 12 HOURS Performed at [facility], [address] [place], [place] [address]    Report Status PENDING  Incomplete      Radiology Studies: Dg Chest 2 View  Result Date: 12/02/2017 CLINICAL DATA:  Shortness of breath, productive cough. EXAM: CHEST  2 VIEW COMPARISON:  Radiographs of October 23, 2017. FINDINGS: Stable cardiomediastinal silhouette with central pulmonary vascular congestion is noted. No pneumothorax is noted. Minimal right pleural effusion is noted. Minimal bibasilar subsegmental atelectasis is noted. Bony thorax is unremarkable. IMPRESSION: Stable central pulmonary vascular congestion. Minimal right pleural effusion. Minimal bibasilar subsegmental atelectasis. Electronically Signed   By: Marijo Conception, M.D.   On: 12/02/2017 18:29     Scheduled Meds: . amLODipine  10 mg Oral Daily  . aspirin EC  81 mg Oral Daily  . citalopram  10 mg Oral Daily  . furosemide  40 mg Intravenous  Daily  . insulin aspart  0-9 Units Subcutaneous TID WC  . sodium chloride flush  3 mL Intravenous Q12H   Continuous Infusions: . sodium chloride       Marzetta Board, MD, PhD [facility] Pager [phone]  If 7PM-7AM, please contact night-coverage [email or web address] Password [email or web address] 12/03/2017, 1:02 PM

## 2017-12-04 DIAGNOSIS — E785 Hyperlipidemia, unspecified: Secondary | ICD-10-CM | POA: Diagnosis not present

## 2017-12-04 DIAGNOSIS — I5031 Acute diastolic (congestive) heart failure: Secondary | ICD-10-CM | POA: Diagnosis not present

## 2017-12-04 DIAGNOSIS — Z85038 Personal history of other malignant neoplasm of large intestine: Secondary | ICD-10-CM | POA: Diagnosis not present

## 2017-12-04 DIAGNOSIS — Z7982 Long term (current) use of aspirin: Secondary | ICD-10-CM | POA: Diagnosis not present

## 2017-12-04 DIAGNOSIS — Z7984 Long term (current) use of oral hypoglycemic drugs: Secondary | ICD-10-CM | POA: Diagnosis not present

## 2017-12-04 DIAGNOSIS — N183 Chronic kidney disease, stage 3 (moderate): Secondary | ICD-10-CM | POA: Diagnosis not present

## 2017-12-04 DIAGNOSIS — I071 Rheumatic tricuspid insufficiency: Secondary | ICD-10-CM | POA: Diagnosis not present

## 2017-12-04 DIAGNOSIS — E1122 Type 2 diabetes mellitus with diabetic chronic kidney disease: Secondary | ICD-10-CM | POA: Diagnosis not present

## 2017-12-04 DIAGNOSIS — E876 Hypokalemia: Secondary | ICD-10-CM | POA: Diagnosis not present

## 2017-12-04 DIAGNOSIS — I130 Hypertensive heart and chronic kidney disease with heart failure and stage 1 through stage 4 chronic kidney disease, or unspecified chronic kidney disease: Secondary | ICD-10-CM | POA: Diagnosis not present

## 2017-12-04 DIAGNOSIS — Z79899 Other long term (current) drug therapy: Secondary | ICD-10-CM | POA: Diagnosis not present

## 2017-12-04 DIAGNOSIS — R319 Hematuria, unspecified: Secondary | ICD-10-CM | POA: Diagnosis not present

## 2017-12-04 DIAGNOSIS — M1990 Unspecified osteoarthritis, unspecified site: Secondary | ICD-10-CM | POA: Diagnosis not present

## 2017-12-04 DIAGNOSIS — T68XXXA Hypothermia, initial encounter: Secondary | ICD-10-CM | POA: Diagnosis not present

## 2017-12-04 DIAGNOSIS — I5033 Acute on chronic diastolic (congestive) heart failure: Secondary | ICD-10-CM | POA: Diagnosis not present

## 2017-12-04 DIAGNOSIS — Z87891 Personal history of nicotine dependence: Secondary | ICD-10-CM | POA: Diagnosis not present

## 2017-12-04 DIAGNOSIS — R2681 Unsteadiness on feet: Secondary | ICD-10-CM | POA: Diagnosis not present

## 2017-12-04 DIAGNOSIS — I447 Left bundle-branch block, unspecified: Secondary | ICD-10-CM | POA: Diagnosis not present

## 2017-12-04 DIAGNOSIS — E11649 Type 2 diabetes mellitus with hypoglycemia without coma: Secondary | ICD-10-CM | POA: Diagnosis not present

## 2017-12-04 LAB — BASIC METABOLIC PANEL
ANION GAP: 10 (ref 5–15)
BUN: 25 mg/dL — ABNORMAL HIGH (ref 6–20)
CALCIUM: 9 mg/dL (ref 8.9–10.3)
CHLORIDE: 105 mmol/L (ref 101–111)
CO2: 27 mmol/L (ref 22–32)
Creatinine, Ser: 1.98 mg/dL — ABNORMAL HIGH (ref 0.61–1.24)
GFR calc non Af Amer: 28 mL/min — ABNORMAL LOW (ref >60)
GFR, EST AFRICAN AMERICAN: 32 mL/min — AB (ref >60)
GLUCOSE: 77 mg/dL (ref 65–99)
POTASSIUM: 3.5 mmol/L (ref 3.5–5.1)
Sodium: 142 mmol/L (ref 135–145)

## 2017-12-04 LAB — HEMOGLOBIN A1C
Hgb A1c MFr Bld: 5 % (ref 4.8–5.6)
Mean Plasma Glucose: 96.8 mg/dL

## 2017-12-04 LAB — GLUCOSE, CAPILLARY
GLUCOSE-CAPILLARY: 85 mg/dL (ref 65–99)
GLUCOSE-CAPILLARY: 96 mg/dL (ref 65–99)
GLUCOSE-CAPILLARY: 119 mg/dL — AB (ref 65–99)
Glucose-Capillary: 63 mg/dL — ABNORMAL LOW (ref 65–99)
Glucose-Capillary: 91 mg/dL (ref 65–99)

## 2017-12-04 LAB — URINE CULTURE

## 2017-12-04 MED ORDER — FUROSEMIDE 20 MG PO TABS
40.0000 mg | ORAL_TABLET | Freq: Every day | ORAL | Status: DC
  Administered 2017-12-04 – 2017-12-05 (×2): 40 mg via ORAL
  Filled 2017-12-04 (×2): qty 2

## 2017-12-04 NOTE — Progress Notes (Addendum)
PROGRESS NOTE    [name]  HMC:[id number] DOB: [birth year] DOA: 12/02/2017 PCP: Robert Lei, MD   Brief Narrative:  84 year old male with history of hypertension, diabetes, recently evaluated in the ED in January for shortness of breath found to be in mild CHF and started on Lasix.  Reports that he has been taking this medication but he is been getting more and more short of breath with progressive swelling in his legs.  He decided to come back to the ED and was admitted for acute on chronic diastolic CHF.  Assessment & Plan:   Principal Problem:   Acute CHF (congestive heart failure) (HCC) Active Problems:   Essential hypertension   Hypothermia   Dyspnea   CHF (congestive heart failure) (HCC)   Acute on chronic diastolic CHF - He tells me he thinks he had [name] cold (with rhochi, this could be accurate) - 2D echo as below, normal ejection fraction as well as grade 1 diastolic dysfunction -Patient was started on Lasix 40 mg IV daily, will switch to 40 PO daily - on 2 L [place], will wean as tolerated -Strict I's and O's, daily weights (don't look to be well recorded, weights below downtrending, but doubt accuracy of 2/11 weight) -PT evaluation pending -Chest x-ray on admission with central pulmonary vascular congestion and slight right-sided pleural effusion  Filed Weights   12/02/17 1728 12/03/17 1945 12/04/17 0532  Weight: 96.6 kg (213 lb) 86.2 kg (190 lb 0.6 oz) 85.5 kg (188 lb 7.9 oz)    Hypertension -continue Norvasc controlled 131/50 this morning, monitor with diuresis as well  Chronic kidney disease stage III - fluctuating creatinine -baseline creatinine 1.5-2.0 -continue to monitor given diuresis  Type 2 diabetes mellitus  Hypoglycemia - follow A1c - he's had some mild hypoglycemia into the 60's (asx, this was after not having meal).  Continue to monitor, consider holding oral agents at discharge until follow up.  Prostate cancer -Outpatient management, he is on  Xtandi  Hypothermia -On admission, temperature now normal, unclear cause  Hematuria - ? Related to hx of prostate cancer?  Needs to be followed as outpatient  DVT prophylaxis: heparin  Code Status: full  Family Communication: discussed with duaghter Disposition Plan: home vs SNF, possibly tomorrow   Consultants:   none  Procedures:  Echo 2/12 Study Conclusions  - Left ventricle: The cavity size was normal. There was moderate   focal basal hypertrophy of the septum. Systolic function was   normal. The estimated ejection fraction was in the range of 60%   to 65%. Wall motion was normal; there were no regional wall   motion abnormalities. Doppler parameters are consistent with   abnormal left ventricular relaxation (grade 1 diastolic   dysfunction). - Aortic valve: Trileaflet; mildly thickened, mildly calcified   leaflets. - Mitral valve: There was mild regurgitation. - Left atrium: The atrium was mildly dilated. - Right atrium: The atrium was mildly dilated. - Tricuspid valve: There was mild regurgitation. - Pulmonary arteries: Systolic pressure was mildly increased. PA   peak pressure: 45 mm Hg (S).  Impressions:  - Compared to the prior study, there has been no significant   interval change.  Complications:  IV Leaking when administering the Definity.  Antimicrobials:  none    Subjective: Feels ok. No CP or SOB or orthopnea or PND. "Notes he's here b/c "I had [name] cold".  (we discussed dx of HF) Breathing feels [name] whole lot better.   Objective: Vitals:   12/03/17 1945  12/03/17 2124 12/03/17 2232 12/04/17 0532  BP:  (!) 147/49  (!) 158/64  Pulse:  75  75  Resp:  18  16  Temp:  (!) 97.5 F (36.4 C) 98 F (36.7 C) 97.9 F (36.6 C)  TempSrc:  Oral Oral Oral  SpO2: 96% 100%  100%  Weight: 86.2 kg (190 lb 0.6 oz)   85.5 kg (188 lb 7.9 oz)  Height:        Intake/Output Summary (Last 24 hours) at 12/04/2017 0855 Last data filed at 12/04/2017  0615 Gross per 24 hour  Intake 210 ml  Output 425 ml  Net -215 ml   Filed Weights   12/02/17 1728 12/03/17 1945 12/04/17 0532  Weight: 96.6 kg (213 lb) 86.2 kg (190 lb 0.6 oz) 85.5 kg (188 lb 7.9 oz)    Examination:  General exam: Appears calm and comfortable  Respiratory system: No increased WOB.  Coarse breath sounds.  Cardiovascular system: S1 & S2 heard, RRR. No JVD, murmurs, rubs, gallops or clicks. No pedal edema. Gastrointestinal system: Abdomen is nondistended, soft and nontender. No organomegaly or masses felt. Normal bowel sounds heard. Central nervous system: Alert and oriented. No focal neurological deficits. Extremities: Trace LEE.  Skin: No rashes, lesions or ulcers Psychiatry: Judgement and insight appear normal. Mood & affect appropriate.     Data Reviewed: I have personally reviewed following labs and imaging studies  CBC: Recent Labs  Lab 12/02/17 1940  WBC 5.1  NEUTROABS 4.1  HGB 11.4*  HCT 32.3*  MCV 82.6  PLT 834   Basic Metabolic Panel: Recent Labs  Lab 12/02/17 1940 12/03/17 0500 12/04/17 0402  NA 143 144 142  K 3.7 3.3* 3.5  CL 109 107 105  CO2 23 25 27   GLUCOSE 113* 97 77  BUN 21* 20 25*  CREATININE 1.55* 1.67* 1.98*  CALCIUM 9.5 9.7 9.0   GFR: Estimated Creatinine Clearance: 24.9 mL/min ([name]) (by C-G formula based on SCr of 1.98 mg/dL (H)). Liver Function Tests: No results for input(s): AST, ALT, ALKPHOS, BILITOT, PROT, ALBUMIN in the last 168 hours. No results for input(s): LIPASE, AMYLASE in the last 168 hours. No results for input(s): AMMONIA in the last 168 hours. Coagulation Profile: No results for input(s): INR, PROTIME in the last 168 hours. Cardiac Enzymes: No results for input(s): CKTOTAL, CKMB, CKMBINDEX, TROPONINI in the last 168 hours. BNP (last 3 results) No results for input(s): PROBNP in the last 8760 hours. HbA1C: No results for input(s): HGBA1C in the last 72 hours. CBG: Recent Labs  Lab 12/03/17 1648  12/03/17 2055 12/03/17 2150 12/04/17 0753 12/04/17 0819  GLUCAP 86 65 133* 63* 85   Lipid Profile: No results for input(s): CHOL, HDL, LDLCALC, TRIG, CHOLHDL, LDLDIRECT in the last 72 hours. Thyroid Function Tests: No results for input(s): TSH, T4TOTAL, FREET4, T3FREE, THYROIDAB in the last 72 hours. Anemia Panel: No results for input(s): VITAMINB12, FOLATE, FERRITIN, TIBC, IRON, RETICCTPCT in the last 72 hours. Sepsis Labs: Recent Labs  Lab 12/02/17 2040  LATICACIDVEN 1.41    Recent Results (from the past 240 hour(s))  Blood culture (routine x 2)     Status: None (Preliminary result)   Collection Time: 12/02/17  7:50 PM  Result Value Ref Range Status   Specimen Description   Final    BLOOD RIGHT ARM Performed at [facility] [address] [place], [place] [address]    Special Requests   Final    BOTTLES DRAWN AEROBIC AND ANAEROBIC  Blood Culture adequate volume Performed at [facility] [address] [place], [place] [address]    Culture   Final    NO GROWTH < 12 HOURS Performed at [facility] [address], [place], [place] [address]    Report Status PENDING  Incomplete  Blood culture (routine x 2)     Status: None (Preliminary result)   Collection Time: 12/02/17  8:00 PM  Result Value Ref Range Status   Specimen Description   Final    BLOOD LEFT FOREARM Performed at [facility] [address], [place], [place] [address]    Special Requests   Final    BOTTLES DRAWN AEROBIC AND ANAEROBIC Blood Culture adequate volume Performed at [facility] [address], [place], [place] [address]    Culture   Final    NO GROWTH < 12 HOURS Performed at [facility] [address] [place], [place] [address]    Report Status PENDING  Incomplete         Radiology Studies: Dg Chest 2 View  Result Date: 12/02/2017 CLINICAL DATA:  Shortness of breath, productive cough.  EXAM: CHEST  2 VIEW COMPARISON:  Radiographs of October 23, 2017. FINDINGS: Stable cardiomediastinal silhouette with central pulmonary vascular congestion is noted. No pneumothorax is noted. Minimal right pleural effusion is noted. Minimal bibasilar subsegmental atelectasis is noted. Bony thorax is unremarkable. IMPRESSION: Stable central pulmonary vascular congestion. Minimal right pleural effusion. Minimal bibasilar subsegmental atelectasis. Electronically Signed   By: Marijo Conception, M.D.   On: 12/02/2017 18:29        Scheduled Meds: . amLODipine  10 mg Oral Daily  . aspirin EC  81 mg Oral Daily  . citalopram  10 mg Oral Daily  . furosemide  40 mg Intravenous Daily  . heparin injection (subcutaneous)  5,000 Units Subcutaneous Q8H  . insulin aspart  0-9 Units Subcutaneous TID WC  . sodium chloride flush  3 mL Intravenous Q12H   Continuous Infusions: . sodium chloride       LOS: 1 day    Time spent: over 30 min    Fayrene Helper, MD [facility] Pager [phone]  If 7PM-7AM, please contact night-coverage [email or web address] Password [facility] 12/04/2017, 8:55 AM

## 2017-12-04 NOTE — Clinical Social Work Note (Signed)
Clinical Social Work Assessment  Patient Details  Name: [name] MRN: [id number] Date of Birth: 1924-06-04  Date of referral:  12/04/17               Reason for consult:  Facility Placement                Permission sought to share information with:  Case Manager, Family Supports Permission granted to share information::  Yes, Verbal Permission Granted  Name::     Interior and spatial designer::     Relationship::  Daughter  Contact Information:     Housing/Transportation Living arrangements for the past 2 months:  Single Family Home Source of Information:    Patient Interpreter Needed:  None Criminal Activity/Legal Involvement Pertinent to Current Situation/Hospitalization:  No - Comment as needed Significant Relationships:  Adult Children Lives with:  Adult Children Do you feel safe going back to the place where you live?  Yes Need for family participation in patient care:  Yes (Comment)  Care giving concerns:  No care giving concerns at the time of assessment.    Social Worker assessment / plan:  LCSW following for SNF placement.   Patient admitted for SOB and swelling.   LCSW met with patient at bedside. No family present. Patient is alert and oriented.   Patient declined SNF at the time of assessment and feels that his daughter will agree with his decision. LCSW attempted to reach daughter by phone. LCSW left message for daughter, [name].   Patient reports that he lives with his daughter [name]. Patient states that he ambulates with a cane at home and uses a walker when he is out on longer trips. Patient reports that he is independent in his ADLs. Daughter was not able to be reached for collateral.   PLAN: TBD  Employment status:  Retired Nurse, adult PT Recommendations:  [place] / Referral to community resources:     Patient/Family's Response to care:  Patient is pleased with care and thankful for LCSW visit.    Patient/Family's Understanding of and Emotional Response to Diagnosis, Current Treatment, and Prognosis:  Patient is understanding of diagnosis, but is not agreeable to SNF at dc.   Emotional Assessment Appearance:  Appears younger than stated age Attitude/Demeanor/Rapport:    Affect (typically observed):  Calm, Pleasant Orientation:  Oriented to Self, Oriented to Place, Oriented to  Time, Oriented to Situation Alcohol / Substance use:  Not Applicable Psych involvement (Current and /or in the community):  No (Comment)  Discharge Needs  Concerns to be addressed:  No discharge needs identified Readmission within the last 30 days:  No Current discharge risk:  None Barriers to Discharge:  Continued Medical Work up   [employer], LCSW 12/04/2017, 4:21 PM

## 2017-12-04 NOTE — Progress Notes (Addendum)
CSW received a request from pt's daytime CM via the pt's nightime CM asking of CSW could provide the family with a list of ALF's.  Nightime CM requested CSWwill also also provide pt's family with a list of [place] agencies as well as a [facility] information sheet and stated a CM would be giving them a call on 2/14.  6:54 PM  CSW provided pt's daughter [name] with a list of [facility] agencies as well as a [facility] information sheet.  CSW counseled pt's daughter on SNF's and ALF's, how they work, who is qualified to go there, insurances that do and don't pay for them and how it is paid for otherwiswe.  CSW provided pt's daughter with a list of Assisted Living Facilities in the [place] area and counseled pt on how to seek admittance.  Pt's daughter was appreciative and thanked the CSW.  Please reconsult if future social work needs arise.  CSW signing off, as social work intervention is no longer needed.  Robert Guild. Neema Barreira, LCSW, LCAS, CSI Clinical Social Worker Ph: [phone]   Robert Guild. Caoimhe Damron, LCSW, LCAS, CSI Clinical Social Worker Ph: [phone]

## 2017-12-04 NOTE — Care Management Note (Signed)
Case Management Note  Patient Details  Name: [name] MRN: [id number] Date of Birth: 20-Sep-1924  Subjective/Objective: 84 y/o m admitted w/CHF. From home. PT-recc SNF. CSW notified. No further CM needs.                   Action/Plan:d/c SNF.   Expected Discharge Date:  (unknown)               Expected Discharge Plan:  Skilled Nursing Facility  In-House Referral:  Clinical Social Work  Discharge planning Services  CM Consult  Post Acute Care Choice:    Choice offered to:     DME Arranged:    DME Agency:     HH Arranged:    [place] Agency:     Status of Service:  In process, will continue to follow  If discussed at Long Length of Stay Meetings, dates discussed:    Additional Comments:  Dessa Phi, RN 12/04/2017, 11:49 AM

## 2017-12-04 NOTE — Evaluation (Signed)
Occupational Therapy Evaluation Patient Details Name: [name] MRN: [id number] DOB: 17-Jul-1924 Today's Date: 12/04/2017    History of Present Illness [name] is a 84 y.o. male with medical history significant of hypertension, diabetes recently seen in the emergency department for shortness of breath found to be in CHF    Clinical Impression   Pt was admitted for the above.  At baseline, he is mod I.  Pt currently needs min guard at this time and cues for safety with RW.  He is used to using a cane. Daughter present for part of the eval and plans to take him home at discharge. She states that he won't be alone for a few days.  Will follow with supervision level goals in acute setting     Follow Up Recommendations  Supervision/Assistance - 24 hour;Home health OT    Equipment Recommendations  3 in 1 bedside commode    Recommendations for Other Services       Precautions / Restrictions Precautions Precautions: Fall Restrictions Weight Bearing Restrictions: No      Mobility Bed Mobility         Supine to sit: Modified independent (Device/Increase time)     General bed mobility comments: HOB raised 20  Transfers   Equipment used: Rolling walker (2 wheeled)   Sit to Stand: Min guard         General transfer comment: for safety; cues for safety and distance when ambulating    Balance                                           ADL either performed or assessed with clinical judgement   ADL Overall ADL's : Needs assistance/impaired Eating/Feeding: Independent   Grooming: Set up;Sitting   Upper Body Bathing: Set up;Sitting   Lower Body Bathing: Minimal assistance;Sit to/from stand   Upper Body Dressing : Set up;Sitting   Lower Body Dressing: Minimal assistance;Sit to/from stand   Toilet Transfer: Min guard;Ambulation;BSC;RW   Toileting- Water quality scientist and Hygiene: Min guard;Sit to/from stand         General ADL  Comments: pt wanted to walk in hall; min guard for safety. Daughter present and wants to take pt home.  She said someone can be with him for a few days.  Cues given for distance of walker and sidestepping through tight spaces. Pt placed walker to side to step through a tight space.     Vision         Perception     Praxis      Pertinent Vitals/Pain Pain Assessment: No/denies pain     Hand Dominance     Extremity/Trunk Assessment Upper Extremity Assessment Upper Extremity Assessment: Generalized weakness           Communication Communication Communication: (pt is difficult to understand)   Cognition Arousal/Alertness: Awake/alert Behavior During Therapy: WFL for tasks assessed/performed Overall Cognitive Status: Within Functional Limits for tasks assessed                                     General Comments       Exercises     Shoulder Instructions      Home Living Family/patient expects to be discharged to:: Private residence Living Arrangements: Children Available Help at Discharge: Available PRN/intermittently Type of  Home: House             Bathroom Shower/Tub: Teacher, early years/pre: Standard     Home Equipment: Environmental consultant - 2 wheels;Cane - single point   Additional Comments: pt states that he usually uses cane in the house      Prior Functioning/Environment Level of Independence: Independent with assistive device(s);Independent                 OT Problem List: Decreased strength;Decreased activity tolerance;Impaired balance (sitting and/or standing);Decreased safety awareness      OT Treatment/Interventions: Self-care/ADL training;DME and/or AE instruction;Patient/family education;Balance training    OT Goals(Current goals can be found in the care plan section) Acute Rehab OT Goals Patient Stated Goal: home; likes to garden but doesn't plant until April OT Goal Formulation: With patient Time For Goal  Achievement: 12/18/17 Potential to Achieve Goals: Good ADL Goals Pt Will Transfer to Toilet: with supervision;ambulating;bedside commode Additional ADL Goal #1: pt will complete adl with set up/supervision, sit to stand Additional ADL Goal #2: pt will not need any safety cues using RW during adl activities  OT Frequency: Min 2X/week   Barriers to D/C:            Co-evaluation              AM-PAC PT "6 Clicks" Daily Activity     Outcome Measure Help from another person eating meals?: None Help from another person taking care of personal grooming?: A Little Help from another person toileting, which includes using toliet, bedpan, or urinal?: A Little Help from another person bathing (including washing, rinsing, drying)?: A Little Help from another person to put on and taking off regular upper body clothing?: A Little Help from another person to put on and taking off regular lower body clothing?: A Little 6 Click Score: 19   End of Session    Activity Tolerance: Patient tolerated treatment well Patient left: in bed;with call bell/phone within reach  OT Visit Diagnosis: Muscle weakness (generalized) (M62.81)                Time: 1346-1410 OT Time Calculation (min): 24 min Charges:  OT General Charges $OT Visit: 1 Visit OT Evaluation $OT Eval Low Complexity: 1 Low OT Treatments $Self Care/Home Management : 8-22 mins G-Codes:     Lesle Chris, OTR/L [phone] 12/04/2017  [name] 12/04/2017, 2:56 PM

## 2017-12-05 DIAGNOSIS — N183 Chronic kidney disease, stage 3 (moderate): Secondary | ICD-10-CM | POA: Diagnosis not present

## 2017-12-05 DIAGNOSIS — E876 Hypokalemia: Secondary | ICD-10-CM | POA: Diagnosis not present

## 2017-12-05 DIAGNOSIS — E1122 Type 2 diabetes mellitus with diabetic chronic kidney disease: Secondary | ICD-10-CM | POA: Diagnosis not present

## 2017-12-05 DIAGNOSIS — I447 Left bundle-branch block, unspecified: Secondary | ICD-10-CM | POA: Diagnosis not present

## 2017-12-05 DIAGNOSIS — Z85038 Personal history of other malignant neoplasm of large intestine: Secondary | ICD-10-CM | POA: Diagnosis not present

## 2017-12-05 DIAGNOSIS — R2681 Unsteadiness on feet: Secondary | ICD-10-CM | POA: Diagnosis not present

## 2017-12-05 DIAGNOSIS — Z7984 Long term (current) use of oral hypoglycemic drugs: Secondary | ICD-10-CM | POA: Diagnosis not present

## 2017-12-05 DIAGNOSIS — Z7982 Long term (current) use of aspirin: Secondary | ICD-10-CM | POA: Diagnosis not present

## 2017-12-05 DIAGNOSIS — M1990 Unspecified osteoarthritis, unspecified site: Secondary | ICD-10-CM | POA: Diagnosis not present

## 2017-12-05 DIAGNOSIS — Z79899 Other long term (current) drug therapy: Secondary | ICD-10-CM | POA: Diagnosis not present

## 2017-12-05 DIAGNOSIS — I5033 Acute on chronic diastolic (congestive) heart failure: Secondary | ICD-10-CM | POA: Diagnosis not present

## 2017-12-05 DIAGNOSIS — I130 Hypertensive heart and chronic kidney disease with heart failure and stage 1 through stage 4 chronic kidney disease, or unspecified chronic kidney disease: Secondary | ICD-10-CM | POA: Diagnosis not present

## 2017-12-05 DIAGNOSIS — Z87891 Personal history of nicotine dependence: Secondary | ICD-10-CM | POA: Diagnosis not present

## 2017-12-05 DIAGNOSIS — R319 Hematuria, unspecified: Secondary | ICD-10-CM | POA: Diagnosis not present

## 2017-12-05 DIAGNOSIS — E785 Hyperlipidemia, unspecified: Secondary | ICD-10-CM | POA: Diagnosis not present

## 2017-12-05 DIAGNOSIS — T68XXXA Hypothermia, initial encounter: Secondary | ICD-10-CM | POA: Diagnosis not present

## 2017-12-05 DIAGNOSIS — E11649 Type 2 diabetes mellitus with hypoglycemia without coma: Secondary | ICD-10-CM | POA: Diagnosis not present

## 2017-12-05 DIAGNOSIS — I071 Rheumatic tricuspid insufficiency: Secondary | ICD-10-CM | POA: Diagnosis not present

## 2017-12-05 LAB — CBC
HCT: 29.5 % — ABNORMAL LOW (ref 39.0–52.0)
HEMOGLOBIN: 10.3 g/dL — AB (ref 13.0–17.0)
MCH: 28.9 pg (ref 26.0–34.0)
MCHC: 34.9 g/dL (ref 30.0–36.0)
MCV: 82.9 fL (ref 78.0–100.0)
PLATELETS: 165 10*3/uL (ref 150–400)
RBC: 3.56 MIL/uL — ABNORMAL LOW (ref 4.22–5.81)
RDW: 15.8 % — ABNORMAL HIGH (ref 11.5–15.5)
WBC: 3.4 10*3/uL — ABNORMAL LOW (ref 4.0–10.5)

## 2017-12-05 LAB — BASIC METABOLIC PANEL
ANION GAP: 13 (ref 5–15)
BUN: 26 mg/dL — ABNORMAL HIGH (ref 6–20)
CO2: 26 mmol/L (ref 22–32)
CREATININE: 2.02 mg/dL — AB (ref 0.61–1.24)
Calcium: 9.1 mg/dL (ref 8.9–10.3)
Chloride: 103 mmol/L (ref 101–111)
GFR, EST AFRICAN AMERICAN: 31 mL/min — AB (ref >60)
GFR, EST NON AFRICAN AMERICAN: 27 mL/min — AB (ref >60)
GLUCOSE: 59 mg/dL — AB (ref 65–99)
Potassium: 3.2 mmol/L — ABNORMAL LOW (ref 3.5–5.1)
Sodium: 142 mmol/L (ref 135–145)

## 2017-12-05 LAB — GLUCOSE, CAPILLARY
GLUCOSE-CAPILLARY: 96 mg/dL (ref 65–99)
Glucose-Capillary: 88 mg/dL (ref 65–99)
Glucose-Capillary: 145 mg/dL — ABNORMAL HIGH (ref 65–99)

## 2017-12-05 MED ORDER — POTASSIUM CHLORIDE CRYS ER 20 MEQ PO TBCR
40.0000 meq | EXTENDED_RELEASE_TABLET | Freq: Once | ORAL | Status: AC
  Administered 2017-12-05: 40 meq via ORAL
  Filled 2017-12-05: qty 2

## 2017-12-05 MED ORDER — FUROSEMIDE 40 MG PO TABS: 40.0000 mg | ORAL_TABLET | Freq: Every day | ORAL | 0 refills | Status: DC

## 2017-12-05 MED ORDER — POTASSIUM CHLORIDE 20 MEQ PO PACK: 20.0000 meq | PACK | Freq: Every day | ORAL | 0 refills | Status: DC

## 2017-12-05 NOTE — Care Management Note (Signed)
Case Management Note  Patient Details  Name: [name] MRN: [id number] Date of Birth: Apr 05, 1924  Subjective/Objective: Spoke to [place], & [name]-c#[phone] on phone about d/c plans. They decline SNF-prefer HHC-per note CSW provided w/HHC agency list for choice. Has cane, rw. OT recc 3n1(family will purchase on own)Dtrs interested in ALF, [facility] program. See CSW note.                   Action/Plan:d/c plan home w/HHC.   Expected Discharge Date:  (unknown)               Expected Discharge Plan:  [place]  In-House Referral:  Clinical Social Work  Discharge planning Services  CM Consult  Post Acute Care Choice:    Choice offered to:  Adult Children  DME Arranged:    DME Agency:     HH Arranged:    HH Agency:     Status of Service:  In process, will continue to follow  If discussed at Long Length of Stay Meetings, dates discussed:    Additional Comments:  Dessa Phi, RN 12/05/2017, 8:35 AM

## 2017-12-05 NOTE — Progress Notes (Signed)
Nsg Discharge Note  Admit Date:  12/02/2017 Discharge date: 12/05/2017   [place] Back to be D/C'd Home per MD order.  Patient's daughter signed, dated and timed last sheet of AVS for chart. Patient/caregiver able to verbalize understanding.  Discharge Medication: Allergies as of 12/05/2017   No Known Allergies     Medication List    STOP taking these medications   HYDROcodone-acetaminophen 5-325 MG tablet Commonly known as:  NORCO/VICODIN   meclizine 25 MG tablet Commonly known as:  ANTIVERT   pioglitazone 15 MG tablet Commonly known as:  ACTOS   potassium chloride SA 20 MEQ tablet Commonly known as:  K-DUR,KLOR-CON     TAKE these medications   amLODipine 10 MG tablet Commonly known as:  NORVASC Take 10 mg by mouth daily.   aspirin EC 81 MG tablet Take 1 tablet (81 mg total) by mouth daily.   citalopram 10 MG tablet Commonly known as:  CELEXA Take 10 mg by mouth daily.   furosemide 40 MG tablet Commonly known as:  LASIX Take 1 tablet (40 mg total) by mouth daily. (take for 3 days then stop and follow up with PCP to determine whether to continue) What changed:    medication strength  how much to take  additional instructions   potassium chloride 20 MEQ packet Commonly known as:  KLOR-CON Take 20 mEq by mouth daily. (take for 3 days, then stop and follow up labs with PCP to determine whether to continue)   XTANDI 40 MG capsule Generic drug:  enzalutamide Take 40 mg by mouth daily.            Durable Medical Equipment  (From admission, onward)        Start     Ordered   12/05/17 0940  DME 3-in-1  Once     12/05/17 0940      Discharge Assessment: Vitals:   12/05/17 0510 12/05/17 0804  BP: (!) 144/48 (!) 146/65  Pulse: 62 65  Resp: 17 16  Temp: 98 F (36.7 C) 97.9 F (36.6 C)  SpO2: 96% 100%   Skin clean, dry and intact without evidence of skin break down, no evidence of skin tears noted.  D/c Instructions-Education: Discharge  instructions given to patient's daugther with verbalized understanding. D/c education completed with patient's daugther including follow up instructions, medication list, d/c activities limitations if indicated, with other d/c instructions as indicated by MD - patient able to verbalize understanding, all questions fully answered. Patient instructed to return to ED, call 911, or call MD for any changes in condition.  Patient escorted via [place], and D/C home via private auto.  Dorita Fray, RN 12/05/2017 4:33 PM

## 2017-12-05 NOTE — Progress Notes (Signed)
Occupational Therapy Treatment Patient Details Name: [name] MRN: [id number] DOB: [birth year] Today's Date: 12/05/2017    History of present illness [name] is a 84 y.o. male with medical history significant of hypertension, diabetes recently seen in the emergency department for shortness of breath found to be in CHF    OT comments  Performed bathing and dressing in his own clothing. Plan is for d/c home today  Follow Up Recommendations  Supervision/Assistance - 24 hour;Home health OT    Equipment Recommendations  3 in 1 bedside commode    Recommendations for Other Services      Precautions / Restrictions Precautions Precautions: Fall Restrictions Weight Bearing Restrictions: No       Mobility Bed Mobility         Supine to sit: Modified independent (Device/Increase time)     General bed mobility comments: HOB raised 20  Transfers   Equipment used: Rolling walker (2 wheeled)   Sit to Stand: Min guard         General transfer comment: cues for safety around obstacles    Balance                                           ADL either performed or assessed with clinical judgement   ADL       Grooming: Set up;Sitting   Upper Body Bathing: Set up;Sitting   Lower Body Bathing: Minimal assistance;Sit to/from stand   Upper Body Dressing : Set up;Sitting   Lower Body Dressing: Minimal assistance;Sit to/from stand                 General ADL Comments: ambulated to sink in room and performed ADL, donning his own clothing. Cues for safety with RW     Vision       Perception     Praxis      Cognition Arousal/Alertness: Awake/alert Behavior During Therapy: WFL for tasks assessed/performed Overall Cognitive Status: Within Functional Limits for tasks assessed                                          Exercises     Shoulder Instructions       General Comments      Pertinent Vitals/ Pain        Pain Assessment: No/denies pain  Home Living                                          Prior Functioning/Environment              Frequency           Progress Toward Goals  OT Goals(current goals can now be found in the care plan section)  Progress towards OT goals: Progressing toward goals     Plan      Co-evaluation                 AM-PAC PT "6 Clicks" Daily Activity     Outcome Measure   Help from another person eating meals?: None Help from another person taking care of personal grooming?: A Little Help from another person toileting, which includes using toliet, bedpan, or urinal?:  A Little Help from another person bathing (including washing, rinsing, drying)?: A Little Help from another person to put on and taking off regular upper body clothing?: A Little Help from another person to put on and taking off regular lower body clothing?: A Little 6 Click Score: 19    End of Session        Activity Tolerance Patient tolerated treatment well   Patient Left in bed;with call bell/phone within reach   Nurse Communication          Time: 5027-7412 OT Time Calculation (min): 20 min  Charges: OT General Charges $OT Visit: 1 Visit OT Treatments $Self Care/Home Management : 8-22 mins  [name], OTR/L [phone] 12/05/2017   [place] 12/05/2017, 11:25 AM

## 2017-12-05 NOTE — Discharge Summary (Signed)
Physician Discharge Summary  [name] JKK:[id number] DOB: [birth year] DOA: 12/02/2017  PCP: Lucianne Lei, MD  Admit date: 12/02/2017 Discharge date: 12/05/2017  Time spent: over 30 minutes  Recommendations for Outpatient Follow-up:  1. Follow up outpatient CBC/CMP (attention to creatinine/potassium with recent diuresis) 2. Follow up volume status, need for continued lasix (instructed to d/c after 3 days) 3. Follow up K, need to continue potassium? 4. Actos discontinued on d/c given hypoglycemia 5. Follow hematuria seen on UA here, needs follow up with urology for this   Discharge Diagnoses:  Principal Problem:   Acute CHF (congestive heart failure) ([place]) Active Problems:   Essential hypertension   Hypothermia   Dyspnea   CHF (congestive heart failure) ([place])   Discharge Condition: stable  Diet recommendation: heart healthy  Filed Weights   12/03/17 1945 12/04/17 0532 12/05/17 0606  Weight: 86.2 kg (190 lb 0.6 oz) 85.5 kg (188 lb 7.9 oz) 86.5 kg (190 lb 11.2 oz)    History of present illness:  Per PN 84 year old male with history of hypertension, diabetes, recently evaluated in the ED in January for shortness of breath found to be in mild CHF and started on Lasix. Reports that he has been taking this medication but he is been getting more and more short of breath with progressive swelling in his legs. He decided to come back to the ED and was admitted for acute on chronic diastolic CHF.  Hospital Course:  Acute on chronic diastolic CHF - Pt improved, stable on RA at discharge - BNP at presentation elevated to ~300, echo with grade 1 dd and 60-65% LVEF (see report) - CXR on presentation with central pulm vascular congestion, minimal R pleural effusion - He tells me he thinks he had a cold (with rhochi, this could be accurate and may have exacerbated HF sx) - Patient was started on Lasix 40 mg IV daily, discharged on 40 PO daily to continue for 3 days then follow up with PCP  regarding need for continued lasix -PT rec home health - good UOP with PO lasix on 2/13  Filed Weights   12/03/17 1945 12/04/17 0532 12/05/17 0606  Weight: 86.2 kg (190 lb 0.6 oz) 85.5 kg (188 lb 7.9 oz) 86.5 kg (190 lb 11.2 oz)   Hypertension -continue Norvasc controlled 131/50 this morning, monitor with diuresis as well  Chronic kidney disease stage III - fluctuating creatinine -baseline creatinine 1.5-2.0 -continue to monitor given diuresis  Type 2 diabetes mellitus  Hypoglycemia - follow A1c (5)  - he's had some mild hypoglycemia into the 60's (asx, this was after not having meal).   - actos discontinued on discharge  Prostate cancer -Outpatient management, he is onXtandi  Hypothermia -On admission, temperaturenownormal, unclear cause  Hematuria - ? Related to hx of prostate cancer?  Needs to be followed as outpatient  Procedures: Echo Study Conclusions  - Left ventricle: The cavity size was normal. There was moderate   focal basal hypertrophy of the septum. Systolic function was   normal. The estimated ejection fraction was in the range of 60%   to 65%. Wall motion was normal; there were no regional wall   motion abnormalities. Doppler parameters are consistent with   abnormal left ventricular relaxation (grade 1 diastolic   dysfunction). - Aortic valve: Trileaflet; mildly thickened, mildly calcified   leaflets. - Mitral valve: There was mild regurgitation. - Left atrium: The atrium was mildly dilated. - Right atrium: The atrium was mildly dilated. - Tricuspid valve: There was  mild regurgitation. - Pulmonary arteries: Systolic pressure was mildly increased. PA   peak pressure: 45 mm Hg (S).  Impressions:  - Compared to the prior study, there has been no significant   interval change.  Complications:  IV Leaking when administering the Definity.  Consultations:  none  Discharge Exam: Vitals:   12/05/17 0510 12/05/17 0804  BP: (!) 144/48  (!) 146/65  Pulse: 62 65  Resp: 17 16  Temp: 98 F (36.7 C) 97.9 F (36.6 C)  SpO2: 96% 100%   Feeling better.  SOB better.  General: No acute distress. Cardiovascular: Heart sounds show a regular rate, and rhythm. No gallops or rubs. No murmurs. No JVD. Lungs: Clear to auscultation bilaterally with good air movement. No increased WOB.  Abdomen: Soft, nontender, nondistended with normal active bowel sounds. No masses. No hepatosplenomegaly. Neurological: Alert and oriented 3. Moves all extremities 4 with equal strength. Cranial nerves II through XII grossly intact. Skin: Warm and dry. No rashes or lesions. Extremities: No clubbing or cyanosis. No edema Psychiatric: Mood and affect are normal. Insight and judgment are appropirate.  Discharge Instructions   Discharge Instructions    (HEART FAILURE PATIENTS) Call MD:  Anytime you have any of the following symptoms: 1) 3 pound weight gain in 24 hours or 5 pounds in 1 week 2) shortness of breath, with or without a dry hacking cough 3) swelling in the hands, feet or stomach 4) if you have to sleep on extra pillows at night in order to breathe.   Complete by:  As directed    Call MD for:  difficulty breathing, headache or visual disturbances   Complete by:  As directed    Call MD for:  extreme fatigue   Complete by:  As directed    Call MD for:  persistant dizziness or light-headedness   Complete by:  As directed    Call MD for:  persistant nausea and vomiting   Complete by:  As directed    Call MD for:  severe uncontrolled pain   Complete by:  As directed    Call MD for:  temperature >100.4   Complete by:  As directed    Diet - low sodium heart healthy   Complete by:  As directed    Diet - low sodium heart healthy   Complete by:  As directed    Discharge instructions   Complete by:  As directed    You were seen for a heart failure exacerbation.  You improved with lasix.  Continue the lasix daily for the next 3 days.  After  this, stop the lasix and follow up with your PCP to determine whether to continue this medication.  Check your weight daily.  If your weight increases more than 2-3 lbs in a day or 5 lbs in a week take a dose of lasix.  We'll send you on potassium as well, take this with the lasix, then stop and follow up with your PCP to determine whether you should continue this.  We stopped your actos at discharge because of low blood sugars.  There was blood in your urine, please follow up with urology regarding this.  Return with new, worsening, or recurrent symptoms.  Ask your PCP to request records from this hospitalization so they know what was done and what the next steps are.   Heart Failure patients record your daily weight using the same scale at the same time of day   Complete by:  As  directed    Increase activity slowly   Complete by:  As directed    Increase activity slowly   Complete by:  As directed      Allergies as of 12/05/2017   No Known Allergies     Medication List    STOP taking these medications   HYDROcodone-acetaminophen 5-325 MG tablet Commonly known as:  NORCO/VICODIN   meclizine 25 MG tablet Commonly known as:  ANTIVERT   pioglitazone 15 MG tablet Commonly known as:  ACTOS   potassium chloride SA 20 MEQ tablet Commonly known as:  K-DUR,KLOR-CON     TAKE these medications   amLODipine 10 MG tablet Commonly known as:  NORVASC Take 10 mg by mouth daily.   aspirin EC 81 MG tablet Take 1 tablet (81 mg total) by mouth daily.   citalopram 10 MG tablet Commonly known as:  CELEXA Take 10 mg by mouth daily.   furosemide 40 MG tablet Commonly known as:  LASIX Take 1 tablet (40 mg total) by mouth daily. (take for 3 days then stop and follow up with PCP to determine whether to continue) What changed:    medication strength  how much to take  additional instructions   potassium chloride 20 MEQ packet Commonly known as:  KLOR-CON Take 20 mEq by mouth daily. (take  for 3 days, then stop and follow up labs with PCP to determine whether to continue)   XTANDI 40 MG capsule Generic drug:  enzalutamide Take 40 mg by mouth daily.            Durable Medical Equipment  (From admission, onward)        Start     Ordered   12/05/17 0940  DME 3-in-1  Once     12/05/17 0940     No Known Allergies Follow-up Information    [facility].   Specialty:  Home Health Services Contact information: [address] [place] [address] [phone]            The results of significant diagnostics from this hospitalization (including imaging, microbiology, ancillary and laboratory) are listed below for reference.    Significant Diagnostic Studies: Dg Chest 2 View  Result Date: 12/02/2017 CLINICAL DATA:  Shortness of breath, productive cough. EXAM: CHEST  2 VIEW COMPARISON:  Radiographs of October 23, 2017. FINDINGS: Stable cardiomediastinal silhouette with central pulmonary vascular congestion is noted. No pneumothorax is noted. Minimal right pleural effusion is noted. Minimal bibasilar subsegmental atelectasis is noted. Bony thorax is unremarkable. IMPRESSION: Stable central pulmonary vascular congestion. Minimal right pleural effusion. Minimal bibasilar subsegmental atelectasis. Electronically Signed   By: Marijo Conception, M.D.   On: 12/02/2017 18:29    Microbiology: Recent Results (from the past 240 hour(s))  Blood culture (routine x 2)     Status: None (Preliminary result)   Collection Time: 12/02/17  7:50 PM  Result Value Ref Range Status   Specimen Description   Final    BLOOD RIGHT ARM Performed at [facility], [place] [address], [place], [place] [address]    Special Requests   Final    BOTTLES DRAWN AEROBIC AND ANAEROBIC Blood Culture adequate volume Performed at [facility] [address] [place], [place] [address]    Culture   Final    NO GROWTH 3  DAYS Performed at [facility], [place] [address] [place], [place] [address]    Report Status PENDING  Incomplete  Blood culture (routine x 2)     Status: None (Preliminary result)   Collection Time: 12/02/17  8:00 PM  Result Value Ref Range Status   Specimen Description   Final    BLOOD LEFT FOREARM Performed at [facility] [address], [place], [place] [address]    Special Requests   Final    BOTTLES DRAWN AEROBIC AND ANAEROBIC Blood Culture adequate volume Performed at [facility] [address], [place], [place] [address]    Culture   Final    NO GROWTH 3 DAYS Performed at [facility], [place] [address], [place], [place] [address]    Report Status PENDING  Incomplete  Urine culture     Status: Abnormal   Collection Time: 12/02/17  8:59 PM  Result Value Ref Range Status   Specimen Description   Final    URINE, RANDOM Performed at [facility] [address], [place], [place] [address]    Special Requests   Final    NONE Performed at [facility], [place] [address] [place], [place] [address]    Culture MULTIPLE SPECIES PRESENT, SUGGEST RECOLLECTION (A)  Final   Report Status 12/04/2017 FINAL  Final     Labs: Basic Metabolic Panel: Recent Labs  Lab 12/02/17 1940 12/03/17 0500 12/04/17 0402 12/05/17 0430  NA 143 144 142 142  K 3.7 3.3* 3.5 3.2*  CL 109 107 105 103  CO2 23 25 27 26   GLUCOSE 113* 97 77 59*  BUN 21* 20 25* 26*  CREATININE 1.55* 1.67* 1.98* 2.02*  CALCIUM 9.5 9.7 9.0 9.1   Liver Function Tests: No results for input(s): AST, ALT, ALKPHOS, BILITOT, PROT, ALBUMIN in the last 168 hours. No results for input(s): LIPASE, AMYLASE in the last 168 hours. No results for input(s): AMMONIA in the last 168 hours. CBC: Recent Labs  Lab 12/02/17 1940 12/05/17 0430  WBC 5.1 3.4*  NEUTROABS 4.1  --   HGB 11.4* 10.3*  HCT 32.3* 29.5*  MCV 82.6 82.9  PLT 160 165    Cardiac Enzymes: No results for input(s): CKTOTAL, CKMB, CKMBINDEX, TROPONINI in the last 168 hours. BNP: BNP (last 3 results) Recent Labs    12/02/17 1940  BNP 368.5*    ProBNP (last 3 results) No results for input(s): PROBNP in the last 8760 hours.  CBG: Recent Labs  Lab 12/04/17 1656 12/04/17 2108 12/05/17 0634 12/05/17 0755 12/05/17 1133  GLUCAP 91 119* 96 88 145*       Signed:  Fayrene Helper MD.  [facility] 12/05/2017, 7:10 PM

## 2017-12-05 NOTE — Progress Notes (Signed)
This CM spoke with daughter Festus Holts via phone as a follow up conversation with CM yesterday about choice for home health services. [facility] chosen and [facility] rep alerted of referral. [facility] to provide HHPT/RN/Aide/SW. Festus Holts states she does not have any DME needs. Marney Doctor RN,BSN,NCM [phone]

## 2017-12-06 DIAGNOSIS — I509 Heart failure, unspecified: Secondary | ICD-10-CM | POA: Diagnosis not present

## 2017-12-06 DIAGNOSIS — E785 Hyperlipidemia, unspecified: Secondary | ICD-10-CM | POA: Diagnosis not present

## 2017-12-06 DIAGNOSIS — E118 Type 2 diabetes mellitus with unspecified complications: Secondary | ICD-10-CM | POA: Diagnosis not present

## 2017-12-06 DIAGNOSIS — I110 Hypertensive heart disease with heart failure: Secondary | ICD-10-CM | POA: Diagnosis not present

## 2017-12-06 DIAGNOSIS — I10 Essential (primary) hypertension: Secondary | ICD-10-CM | POA: Diagnosis not present

## 2017-12-06 DIAGNOSIS — J441 Chronic obstructive pulmonary disease with (acute) exacerbation: Secondary | ICD-10-CM | POA: Diagnosis not present

## 2017-12-07 LAB — CULTURE, BLOOD (ROUTINE X 2)
CULTURE: NO GROWTH
Culture: NO GROWTH
SPECIAL REQUESTS: ADEQUATE
Special Requests: ADEQUATE

## 2017-12-09 DIAGNOSIS — C61 Malignant neoplasm of prostate: Secondary | ICD-10-CM | POA: Diagnosis not present

## 2017-12-09 DIAGNOSIS — R319 Hematuria, unspecified: Secondary | ICD-10-CM | POA: Diagnosis not present

## 2017-12-09 DIAGNOSIS — E119 Type 2 diabetes mellitus without complications: Secondary | ICD-10-CM | POA: Diagnosis not present

## 2017-12-09 DIAGNOSIS — N183 Chronic kidney disease, stage 3 (moderate): Secondary | ICD-10-CM | POA: Diagnosis not present

## 2017-12-09 DIAGNOSIS — M1990 Unspecified osteoarthritis, unspecified site: Secondary | ICD-10-CM | POA: Diagnosis not present

## 2017-12-09 DIAGNOSIS — Z87891 Personal history of nicotine dependence: Secondary | ICD-10-CM | POA: Diagnosis not present

## 2017-12-09 DIAGNOSIS — Z9181 History of falling: Secondary | ICD-10-CM | POA: Diagnosis not present

## 2017-12-09 DIAGNOSIS — I5033 Acute on chronic diastolic (congestive) heart failure: Secondary | ICD-10-CM | POA: Diagnosis not present

## 2017-12-09 DIAGNOSIS — Z85038 Personal history of other malignant neoplasm of large intestine: Secondary | ICD-10-CM | POA: Diagnosis not present

## 2017-12-09 DIAGNOSIS — Z7982 Long term (current) use of aspirin: Secondary | ICD-10-CM | POA: Diagnosis not present

## 2017-12-09 DIAGNOSIS — I130 Hypertensive heart and chronic kidney disease with heart failure and stage 1 through stage 4 chronic kidney disease, or unspecified chronic kidney disease: Secondary | ICD-10-CM | POA: Diagnosis not present

## 2017-12-10 DIAGNOSIS — Z87891 Personal history of nicotine dependence: Secondary | ICD-10-CM | POA: Diagnosis not present

## 2017-12-10 DIAGNOSIS — E119 Type 2 diabetes mellitus without complications: Secondary | ICD-10-CM | POA: Diagnosis not present

## 2017-12-10 DIAGNOSIS — I5033 Acute on chronic diastolic (congestive) heart failure: Secondary | ICD-10-CM | POA: Diagnosis not present

## 2017-12-10 DIAGNOSIS — R319 Hematuria, unspecified: Secondary | ICD-10-CM | POA: Diagnosis not present

## 2017-12-10 DIAGNOSIS — N183 Chronic kidney disease, stage 3 (moderate): Secondary | ICD-10-CM | POA: Diagnosis not present

## 2017-12-10 DIAGNOSIS — I130 Hypertensive heart and chronic kidney disease with heart failure and stage 1 through stage 4 chronic kidney disease, or unspecified chronic kidney disease: Secondary | ICD-10-CM | POA: Diagnosis not present

## 2017-12-10 DIAGNOSIS — C61 Malignant neoplasm of prostate: Secondary | ICD-10-CM | POA: Diagnosis not present

## 2017-12-10 DIAGNOSIS — Z9181 History of falling: Secondary | ICD-10-CM | POA: Diagnosis not present

## 2017-12-10 DIAGNOSIS — Z85038 Personal history of other malignant neoplasm of large intestine: Secondary | ICD-10-CM | POA: Diagnosis not present

## 2017-12-10 DIAGNOSIS — Z7982 Long term (current) use of aspirin: Secondary | ICD-10-CM | POA: Diagnosis not present

## 2017-12-10 DIAGNOSIS — M1990 Unspecified osteoarthritis, unspecified site: Secondary | ICD-10-CM | POA: Diagnosis not present

## 2017-12-12 DIAGNOSIS — R319 Hematuria, unspecified: Secondary | ICD-10-CM | POA: Diagnosis not present

## 2017-12-12 DIAGNOSIS — I5033 Acute on chronic diastolic (congestive) heart failure: Secondary | ICD-10-CM | POA: Diagnosis not present

## 2017-12-12 DIAGNOSIS — Z9181 History of falling: Secondary | ICD-10-CM | POA: Diagnosis not present

## 2017-12-12 DIAGNOSIS — Z87891 Personal history of nicotine dependence: Secondary | ICD-10-CM | POA: Diagnosis not present

## 2017-12-12 DIAGNOSIS — E119 Type 2 diabetes mellitus without complications: Secondary | ICD-10-CM | POA: Diagnosis not present

## 2017-12-12 DIAGNOSIS — N183 Chronic kidney disease, stage 3 (moderate): Secondary | ICD-10-CM | POA: Diagnosis not present

## 2017-12-12 DIAGNOSIS — Z85038 Personal history of other malignant neoplasm of large intestine: Secondary | ICD-10-CM | POA: Diagnosis not present

## 2017-12-12 DIAGNOSIS — I130 Hypertensive heart and chronic kidney disease with heart failure and stage 1 through stage 4 chronic kidney disease, or unspecified chronic kidney disease: Secondary | ICD-10-CM | POA: Diagnosis not present

## 2017-12-12 DIAGNOSIS — Z7982 Long term (current) use of aspirin: Secondary | ICD-10-CM | POA: Diagnosis not present

## 2017-12-12 DIAGNOSIS — C61 Malignant neoplasm of prostate: Secondary | ICD-10-CM | POA: Diagnosis not present

## 2017-12-12 DIAGNOSIS — M1990 Unspecified osteoarthritis, unspecified site: Secondary | ICD-10-CM | POA: Diagnosis not present

## 2017-12-17 DIAGNOSIS — C61 Malignant neoplasm of prostate: Secondary | ICD-10-CM | POA: Diagnosis not present

## 2017-12-17 DIAGNOSIS — N183 Chronic kidney disease, stage 3 (moderate): Secondary | ICD-10-CM | POA: Diagnosis not present

## 2017-12-17 DIAGNOSIS — I130 Hypertensive heart and chronic kidney disease with heart failure and stage 1 through stage 4 chronic kidney disease, or unspecified chronic kidney disease: Secondary | ICD-10-CM | POA: Diagnosis not present

## 2017-12-17 DIAGNOSIS — Z7982 Long term (current) use of aspirin: Secondary | ICD-10-CM | POA: Diagnosis not present

## 2017-12-17 DIAGNOSIS — Z85038 Personal history of other malignant neoplasm of large intestine: Secondary | ICD-10-CM | POA: Diagnosis not present

## 2017-12-17 DIAGNOSIS — I5033 Acute on chronic diastolic (congestive) heart failure: Secondary | ICD-10-CM | POA: Diagnosis not present

## 2017-12-17 DIAGNOSIS — J441 Chronic obstructive pulmonary disease with (acute) exacerbation: Secondary | ICD-10-CM | POA: Diagnosis not present

## 2017-12-17 DIAGNOSIS — Z9181 History of falling: Secondary | ICD-10-CM | POA: Diagnosis not present

## 2017-12-17 DIAGNOSIS — R319 Hematuria, unspecified: Secondary | ICD-10-CM | POA: Diagnosis not present

## 2017-12-17 DIAGNOSIS — N189 Chronic kidney disease, unspecified: Secondary | ICD-10-CM | POA: Diagnosis not present

## 2017-12-17 DIAGNOSIS — Z87891 Personal history of nicotine dependence: Secondary | ICD-10-CM | POA: Diagnosis not present

## 2017-12-17 DIAGNOSIS — I10 Essential (primary) hypertension: Secondary | ICD-10-CM | POA: Diagnosis not present

## 2017-12-17 DIAGNOSIS — E119 Type 2 diabetes mellitus without complications: Secondary | ICD-10-CM | POA: Diagnosis not present

## 2017-12-17 DIAGNOSIS — J9801 Acute bronchospasm: Secondary | ICD-10-CM | POA: Diagnosis not present

## 2017-12-17 DIAGNOSIS — M1990 Unspecified osteoarthritis, unspecified site: Secondary | ICD-10-CM | POA: Diagnosis not present

## 2017-12-19 DIAGNOSIS — Z9181 History of falling: Secondary | ICD-10-CM | POA: Diagnosis not present

## 2017-12-19 DIAGNOSIS — Z87891 Personal history of nicotine dependence: Secondary | ICD-10-CM | POA: Diagnosis not present

## 2017-12-19 DIAGNOSIS — N183 Chronic kidney disease, stage 3 (moderate): Secondary | ICD-10-CM | POA: Diagnosis not present

## 2017-12-19 DIAGNOSIS — I5033 Acute on chronic diastolic (congestive) heart failure: Secondary | ICD-10-CM | POA: Diagnosis not present

## 2017-12-19 DIAGNOSIS — E119 Type 2 diabetes mellitus without complications: Secondary | ICD-10-CM | POA: Diagnosis not present

## 2017-12-19 DIAGNOSIS — M1990 Unspecified osteoarthritis, unspecified site: Secondary | ICD-10-CM | POA: Diagnosis not present

## 2017-12-19 DIAGNOSIS — Z85038 Personal history of other malignant neoplasm of large intestine: Secondary | ICD-10-CM | POA: Diagnosis not present

## 2017-12-19 DIAGNOSIS — C61 Malignant neoplasm of prostate: Secondary | ICD-10-CM | POA: Diagnosis not present

## 2017-12-19 DIAGNOSIS — R319 Hematuria, unspecified: Secondary | ICD-10-CM | POA: Diagnosis not present

## 2017-12-19 DIAGNOSIS — Z7982 Long term (current) use of aspirin: Secondary | ICD-10-CM | POA: Diagnosis not present

## 2017-12-19 DIAGNOSIS — I130 Hypertensive heart and chronic kidney disease with heart failure and stage 1 through stage 4 chronic kidney disease, or unspecified chronic kidney disease: Secondary | ICD-10-CM | POA: Diagnosis not present

## 2017-12-20 DIAGNOSIS — R319 Hematuria, unspecified: Secondary | ICD-10-CM | POA: Diagnosis not present

## 2017-12-20 DIAGNOSIS — E119 Type 2 diabetes mellitus without complications: Secondary | ICD-10-CM | POA: Diagnosis not present

## 2017-12-20 DIAGNOSIS — Z87891 Personal history of nicotine dependence: Secondary | ICD-10-CM | POA: Diagnosis not present

## 2017-12-20 DIAGNOSIS — I130 Hypertensive heart and chronic kidney disease with heart failure and stage 1 through stage 4 chronic kidney disease, or unspecified chronic kidney disease: Secondary | ICD-10-CM | POA: Diagnosis not present

## 2017-12-20 DIAGNOSIS — I5033 Acute on chronic diastolic (congestive) heart failure: Secondary | ICD-10-CM | POA: Diagnosis not present

## 2017-12-20 DIAGNOSIS — Z85038 Personal history of other malignant neoplasm of large intestine: Secondary | ICD-10-CM | POA: Diagnosis not present

## 2017-12-20 DIAGNOSIS — M1990 Unspecified osteoarthritis, unspecified site: Secondary | ICD-10-CM | POA: Diagnosis not present

## 2017-12-20 DIAGNOSIS — C61 Malignant neoplasm of prostate: Secondary | ICD-10-CM | POA: Diagnosis not present

## 2017-12-20 DIAGNOSIS — Z7982 Long term (current) use of aspirin: Secondary | ICD-10-CM | POA: Diagnosis not present

## 2017-12-20 DIAGNOSIS — N183 Chronic kidney disease, stage 3 (moderate): Secondary | ICD-10-CM | POA: Diagnosis not present

## 2017-12-20 DIAGNOSIS — Z9181 History of falling: Secondary | ICD-10-CM | POA: Diagnosis not present

## 2017-12-23 DIAGNOSIS — I5033 Acute on chronic diastolic (congestive) heart failure: Secondary | ICD-10-CM | POA: Diagnosis not present

## 2017-12-23 DIAGNOSIS — Z87891 Personal history of nicotine dependence: Secondary | ICD-10-CM | POA: Diagnosis not present

## 2017-12-23 DIAGNOSIS — E119 Type 2 diabetes mellitus without complications: Secondary | ICD-10-CM | POA: Diagnosis not present

## 2017-12-23 DIAGNOSIS — C61 Malignant neoplasm of prostate: Secondary | ICD-10-CM | POA: Diagnosis not present

## 2017-12-23 DIAGNOSIS — Z85038 Personal history of other malignant neoplasm of large intestine: Secondary | ICD-10-CM | POA: Diagnosis not present

## 2017-12-23 DIAGNOSIS — I130 Hypertensive heart and chronic kidney disease with heart failure and stage 1 through stage 4 chronic kidney disease, or unspecified chronic kidney disease: Secondary | ICD-10-CM | POA: Diagnosis not present

## 2017-12-23 DIAGNOSIS — Z7982 Long term (current) use of aspirin: Secondary | ICD-10-CM | POA: Diagnosis not present

## 2017-12-23 DIAGNOSIS — N183 Chronic kidney disease, stage 3 (moderate): Secondary | ICD-10-CM | POA: Diagnosis not present

## 2017-12-23 DIAGNOSIS — M1990 Unspecified osteoarthritis, unspecified site: Secondary | ICD-10-CM | POA: Diagnosis not present

## 2017-12-23 DIAGNOSIS — R319 Hematuria, unspecified: Secondary | ICD-10-CM | POA: Diagnosis not present

## 2017-12-23 DIAGNOSIS — Z9181 History of falling: Secondary | ICD-10-CM | POA: Diagnosis not present

## 2017-12-25 DIAGNOSIS — R319 Hematuria, unspecified: Secondary | ICD-10-CM | POA: Diagnosis not present

## 2017-12-25 DIAGNOSIS — I5033 Acute on chronic diastolic (congestive) heart failure: Secondary | ICD-10-CM | POA: Diagnosis not present

## 2017-12-25 DIAGNOSIS — C61 Malignant neoplasm of prostate: Secondary | ICD-10-CM | POA: Diagnosis not present

## 2017-12-25 DIAGNOSIS — E119 Type 2 diabetes mellitus without complications: Secondary | ICD-10-CM | POA: Diagnosis not present

## 2017-12-25 DIAGNOSIS — M1990 Unspecified osteoarthritis, unspecified site: Secondary | ICD-10-CM | POA: Diagnosis not present

## 2017-12-25 DIAGNOSIS — Z7982 Long term (current) use of aspirin: Secondary | ICD-10-CM | POA: Diagnosis not present

## 2017-12-25 DIAGNOSIS — Z85038 Personal history of other malignant neoplasm of large intestine: Secondary | ICD-10-CM | POA: Diagnosis not present

## 2017-12-25 DIAGNOSIS — Z87891 Personal history of nicotine dependence: Secondary | ICD-10-CM | POA: Diagnosis not present

## 2017-12-25 DIAGNOSIS — N183 Chronic kidney disease, stage 3 (moderate): Secondary | ICD-10-CM | POA: Diagnosis not present

## 2017-12-25 DIAGNOSIS — I130 Hypertensive heart and chronic kidney disease with heart failure and stage 1 through stage 4 chronic kidney disease, or unspecified chronic kidney disease: Secondary | ICD-10-CM | POA: Diagnosis not present

## 2017-12-25 DIAGNOSIS — Z9181 History of falling: Secondary | ICD-10-CM | POA: Diagnosis not present

## 2017-12-30 DIAGNOSIS — C61 Malignant neoplasm of prostate: Secondary | ICD-10-CM | POA: Diagnosis not present

## 2017-12-31 DIAGNOSIS — Z85038 Personal history of other malignant neoplasm of large intestine: Secondary | ICD-10-CM | POA: Diagnosis not present

## 2017-12-31 DIAGNOSIS — N183 Chronic kidney disease, stage 3 (moderate): Secondary | ICD-10-CM | POA: Diagnosis not present

## 2017-12-31 DIAGNOSIS — E119 Type 2 diabetes mellitus without complications: Secondary | ICD-10-CM | POA: Diagnosis not present

## 2017-12-31 DIAGNOSIS — C61 Malignant neoplasm of prostate: Secondary | ICD-10-CM | POA: Diagnosis not present

## 2017-12-31 DIAGNOSIS — I5033 Acute on chronic diastolic (congestive) heart failure: Secondary | ICD-10-CM | POA: Diagnosis not present

## 2017-12-31 DIAGNOSIS — M1990 Unspecified osteoarthritis, unspecified site: Secondary | ICD-10-CM | POA: Diagnosis not present

## 2017-12-31 DIAGNOSIS — Z7982 Long term (current) use of aspirin: Secondary | ICD-10-CM | POA: Diagnosis not present

## 2017-12-31 DIAGNOSIS — Z9181 History of falling: Secondary | ICD-10-CM | POA: Diagnosis not present

## 2017-12-31 DIAGNOSIS — Z87891 Personal history of nicotine dependence: Secondary | ICD-10-CM | POA: Diagnosis not present

## 2017-12-31 DIAGNOSIS — R319 Hematuria, unspecified: Secondary | ICD-10-CM | POA: Diagnosis not present

## 2017-12-31 DIAGNOSIS — I130 Hypertensive heart and chronic kidney disease with heart failure and stage 1 through stage 4 chronic kidney disease, or unspecified chronic kidney disease: Secondary | ICD-10-CM | POA: Diagnosis not present

## 2018-01-07 DIAGNOSIS — Z85038 Personal history of other malignant neoplasm of large intestine: Secondary | ICD-10-CM | POA: Diagnosis not present

## 2018-01-07 DIAGNOSIS — I130 Hypertensive heart and chronic kidney disease with heart failure and stage 1 through stage 4 chronic kidney disease, or unspecified chronic kidney disease: Secondary | ICD-10-CM | POA: Diagnosis not present

## 2018-01-07 DIAGNOSIS — R319 Hematuria, unspecified: Secondary | ICD-10-CM | POA: Diagnosis not present

## 2018-01-07 DIAGNOSIS — Z7982 Long term (current) use of aspirin: Secondary | ICD-10-CM | POA: Diagnosis not present

## 2018-01-07 DIAGNOSIS — N183 Chronic kidney disease, stage 3 (moderate): Secondary | ICD-10-CM | POA: Diagnosis not present

## 2018-01-07 DIAGNOSIS — E119 Type 2 diabetes mellitus without complications: Secondary | ICD-10-CM | POA: Diagnosis not present

## 2018-01-07 DIAGNOSIS — C61 Malignant neoplasm of prostate: Secondary | ICD-10-CM | POA: Diagnosis not present

## 2018-01-07 DIAGNOSIS — Z9181 History of falling: Secondary | ICD-10-CM | POA: Diagnosis not present

## 2018-01-07 DIAGNOSIS — Z87891 Personal history of nicotine dependence: Secondary | ICD-10-CM | POA: Diagnosis not present

## 2018-01-07 DIAGNOSIS — M1990 Unspecified osteoarthritis, unspecified site: Secondary | ICD-10-CM | POA: Diagnosis not present

## 2018-01-07 DIAGNOSIS — I5033 Acute on chronic diastolic (congestive) heart failure: Secondary | ICD-10-CM | POA: Diagnosis not present

## 2018-01-09 DIAGNOSIS — E1122 Type 2 diabetes mellitus with diabetic chronic kidney disease: Secondary | ICD-10-CM | POA: Diagnosis not present

## 2018-01-09 DIAGNOSIS — I129 Hypertensive chronic kidney disease with stage 1 through stage 4 chronic kidney disease, or unspecified chronic kidney disease: Secondary | ICD-10-CM | POA: Diagnosis not present

## 2018-01-09 DIAGNOSIS — N189 Chronic kidney disease, unspecified: Secondary | ICD-10-CM | POA: Diagnosis not present

## 2018-01-09 DIAGNOSIS — M069 Rheumatoid arthritis, unspecified: Secondary | ICD-10-CM | POA: Diagnosis not present

## 2018-01-09 DIAGNOSIS — Z7952 Long term (current) use of systemic steroids: Secondary | ICD-10-CM | POA: Diagnosis not present

## 2018-01-09 DIAGNOSIS — R269 Unspecified abnormalities of gait and mobility: Secondary | ICD-10-CM | POA: Diagnosis not present

## 2018-01-09 DIAGNOSIS — Z7984 Long term (current) use of oral hypoglycemic drugs: Secondary | ICD-10-CM | POA: Diagnosis not present

## 2018-01-09 DIAGNOSIS — Z85038 Personal history of other malignant neoplasm of large intestine: Secondary | ICD-10-CM | POA: Diagnosis not present

## 2018-01-14 DIAGNOSIS — Z7984 Long term (current) use of oral hypoglycemic drugs: Secondary | ICD-10-CM | POA: Diagnosis not present

## 2018-01-14 DIAGNOSIS — N189 Chronic kidney disease, unspecified: Secondary | ICD-10-CM | POA: Diagnosis not present

## 2018-01-14 DIAGNOSIS — E1122 Type 2 diabetes mellitus with diabetic chronic kidney disease: Secondary | ICD-10-CM | POA: Diagnosis not present

## 2018-01-14 DIAGNOSIS — R269 Unspecified abnormalities of gait and mobility: Secondary | ICD-10-CM | POA: Diagnosis not present

## 2018-01-14 DIAGNOSIS — Z85038 Personal history of other malignant neoplasm of large intestine: Secondary | ICD-10-CM | POA: Diagnosis not present

## 2018-01-14 DIAGNOSIS — M069 Rheumatoid arthritis, unspecified: Secondary | ICD-10-CM | POA: Diagnosis not present

## 2018-01-14 DIAGNOSIS — I129 Hypertensive chronic kidney disease with stage 1 through stage 4 chronic kidney disease, or unspecified chronic kidney disease: Secondary | ICD-10-CM | POA: Diagnosis not present

## 2018-01-14 DIAGNOSIS — Z7952 Long term (current) use of systemic steroids: Secondary | ICD-10-CM | POA: Diagnosis not present

## 2018-01-15 DIAGNOSIS — E119 Type 2 diabetes mellitus without complications: Secondary | ICD-10-CM | POA: Diagnosis not present

## 2018-01-15 DIAGNOSIS — M1990 Unspecified osteoarthritis, unspecified site: Secondary | ICD-10-CM | POA: Diagnosis not present

## 2018-01-15 DIAGNOSIS — R319 Hematuria, unspecified: Secondary | ICD-10-CM | POA: Diagnosis not present

## 2018-01-15 DIAGNOSIS — Z7982 Long term (current) use of aspirin: Secondary | ICD-10-CM | POA: Diagnosis not present

## 2018-01-15 DIAGNOSIS — Z9181 History of falling: Secondary | ICD-10-CM | POA: Diagnosis not present

## 2018-01-15 DIAGNOSIS — I5033 Acute on chronic diastolic (congestive) heart failure: Secondary | ICD-10-CM | POA: Diagnosis not present

## 2018-01-15 DIAGNOSIS — Z85038 Personal history of other malignant neoplasm of large intestine: Secondary | ICD-10-CM | POA: Diagnosis not present

## 2018-01-15 DIAGNOSIS — N183 Chronic kidney disease, stage 3 (moderate): Secondary | ICD-10-CM | POA: Diagnosis not present

## 2018-01-15 DIAGNOSIS — C61 Malignant neoplasm of prostate: Secondary | ICD-10-CM | POA: Diagnosis not present

## 2018-01-15 DIAGNOSIS — I130 Hypertensive heart and chronic kidney disease with heart failure and stage 1 through stage 4 chronic kidney disease, or unspecified chronic kidney disease: Secondary | ICD-10-CM | POA: Diagnosis not present

## 2018-01-15 DIAGNOSIS — Z87891 Personal history of nicotine dependence: Secondary | ICD-10-CM | POA: Diagnosis not present

## 2018-01-21 DIAGNOSIS — I10 Essential (primary) hypertension: Secondary | ICD-10-CM | POA: Diagnosis not present

## 2018-01-21 DIAGNOSIS — Z85038 Personal history of other malignant neoplasm of large intestine: Secondary | ICD-10-CM | POA: Diagnosis not present

## 2018-01-21 DIAGNOSIS — Z7952 Long term (current) use of systemic steroids: Secondary | ICD-10-CM | POA: Diagnosis not present

## 2018-01-21 DIAGNOSIS — R269 Unspecified abnormalities of gait and mobility: Secondary | ICD-10-CM | POA: Diagnosis not present

## 2018-01-21 DIAGNOSIS — I129 Hypertensive chronic kidney disease with stage 1 through stage 4 chronic kidney disease, or unspecified chronic kidney disease: Secondary | ICD-10-CM | POA: Diagnosis not present

## 2018-01-21 DIAGNOSIS — J449 Chronic obstructive pulmonary disease, unspecified: Secondary | ICD-10-CM | POA: Diagnosis not present

## 2018-01-21 DIAGNOSIS — E1122 Type 2 diabetes mellitus with diabetic chronic kidney disease: Secondary | ICD-10-CM | POA: Diagnosis not present

## 2018-01-21 DIAGNOSIS — Z7984 Long term (current) use of oral hypoglycemic drugs: Secondary | ICD-10-CM | POA: Diagnosis not present

## 2018-01-21 DIAGNOSIS — M069 Rheumatoid arthritis, unspecified: Secondary | ICD-10-CM | POA: Diagnosis not present

## 2018-01-21 DIAGNOSIS — E118 Type 2 diabetes mellitus with unspecified complications: Secondary | ICD-10-CM | POA: Diagnosis not present

## 2018-01-21 DIAGNOSIS — N189 Chronic kidney disease, unspecified: Secondary | ICD-10-CM | POA: Diagnosis not present

## 2018-01-23 DIAGNOSIS — M069 Rheumatoid arthritis, unspecified: Secondary | ICD-10-CM | POA: Diagnosis not present

## 2018-01-23 DIAGNOSIS — E1122 Type 2 diabetes mellitus with diabetic chronic kidney disease: Secondary | ICD-10-CM | POA: Diagnosis not present

## 2018-01-23 DIAGNOSIS — Z7984 Long term (current) use of oral hypoglycemic drugs: Secondary | ICD-10-CM | POA: Diagnosis not present

## 2018-01-23 DIAGNOSIS — N189 Chronic kidney disease, unspecified: Secondary | ICD-10-CM | POA: Diagnosis not present

## 2018-01-23 DIAGNOSIS — I129 Hypertensive chronic kidney disease with stage 1 through stage 4 chronic kidney disease, or unspecified chronic kidney disease: Secondary | ICD-10-CM | POA: Diagnosis not present

## 2018-01-23 DIAGNOSIS — Z7952 Long term (current) use of systemic steroids: Secondary | ICD-10-CM | POA: Diagnosis not present

## 2018-01-23 DIAGNOSIS — Z85038 Personal history of other malignant neoplasm of large intestine: Secondary | ICD-10-CM | POA: Diagnosis not present

## 2018-01-23 DIAGNOSIS — R269 Unspecified abnormalities of gait and mobility: Secondary | ICD-10-CM | POA: Diagnosis not present

## 2018-01-24 DIAGNOSIS — N183 Chronic kidney disease, stage 3 (moderate): Secondary | ICD-10-CM | POA: Diagnosis not present

## 2018-01-24 DIAGNOSIS — Z85038 Personal history of other malignant neoplasm of large intestine: Secondary | ICD-10-CM | POA: Diagnosis not present

## 2018-01-24 DIAGNOSIS — I5033 Acute on chronic diastolic (congestive) heart failure: Secondary | ICD-10-CM | POA: Diagnosis not present

## 2018-01-24 DIAGNOSIS — Z7982 Long term (current) use of aspirin: Secondary | ICD-10-CM | POA: Diagnosis not present

## 2018-01-24 DIAGNOSIS — E119 Type 2 diabetes mellitus without complications: Secondary | ICD-10-CM | POA: Diagnosis not present

## 2018-01-24 DIAGNOSIS — Z87891 Personal history of nicotine dependence: Secondary | ICD-10-CM | POA: Diagnosis not present

## 2018-01-24 DIAGNOSIS — C61 Malignant neoplasm of prostate: Secondary | ICD-10-CM | POA: Diagnosis not present

## 2018-01-24 DIAGNOSIS — M1990 Unspecified osteoarthritis, unspecified site: Secondary | ICD-10-CM | POA: Diagnosis not present

## 2018-01-24 DIAGNOSIS — I130 Hypertensive heart and chronic kidney disease with heart failure and stage 1 through stage 4 chronic kidney disease, or unspecified chronic kidney disease: Secondary | ICD-10-CM | POA: Diagnosis not present

## 2018-01-24 DIAGNOSIS — Z9181 History of falling: Secondary | ICD-10-CM | POA: Diagnosis not present

## 2018-01-24 DIAGNOSIS — R319 Hematuria, unspecified: Secondary | ICD-10-CM | POA: Diagnosis not present

## 2018-01-28 DIAGNOSIS — N183 Chronic kidney disease, stage 3 (moderate): Secondary | ICD-10-CM | POA: Diagnosis not present

## 2018-01-28 DIAGNOSIS — Z7984 Long term (current) use of oral hypoglycemic drugs: Secondary | ICD-10-CM | POA: Diagnosis not present

## 2018-01-28 DIAGNOSIS — Z9181 History of falling: Secondary | ICD-10-CM | POA: Diagnosis not present

## 2018-01-28 DIAGNOSIS — Z7982 Long term (current) use of aspirin: Secondary | ICD-10-CM | POA: Diagnosis not present

## 2018-01-28 DIAGNOSIS — I129 Hypertensive chronic kidney disease with stage 1 through stage 4 chronic kidney disease, or unspecified chronic kidney disease: Secondary | ICD-10-CM | POA: Diagnosis not present

## 2018-01-28 DIAGNOSIS — I130 Hypertensive heart and chronic kidney disease with heart failure and stage 1 through stage 4 chronic kidney disease, or unspecified chronic kidney disease: Secondary | ICD-10-CM | POA: Diagnosis not present

## 2018-01-28 DIAGNOSIS — C61 Malignant neoplasm of prostate: Secondary | ICD-10-CM | POA: Diagnosis not present

## 2018-01-28 DIAGNOSIS — Z87891 Personal history of nicotine dependence: Secondary | ICD-10-CM | POA: Diagnosis not present

## 2018-01-28 DIAGNOSIS — M1990 Unspecified osteoarthritis, unspecified site: Secondary | ICD-10-CM | POA: Diagnosis not present

## 2018-01-28 DIAGNOSIS — E1122 Type 2 diabetes mellitus with diabetic chronic kidney disease: Secondary | ICD-10-CM | POA: Diagnosis not present

## 2018-01-28 DIAGNOSIS — M069 Rheumatoid arthritis, unspecified: Secondary | ICD-10-CM | POA: Diagnosis not present

## 2018-01-28 DIAGNOSIS — Z85038 Personal history of other malignant neoplasm of large intestine: Secondary | ICD-10-CM | POA: Diagnosis not present

## 2018-01-28 DIAGNOSIS — Z7952 Long term (current) use of systemic steroids: Secondary | ICD-10-CM | POA: Diagnosis not present

## 2018-01-28 DIAGNOSIS — R319 Hematuria, unspecified: Secondary | ICD-10-CM | POA: Diagnosis not present

## 2018-01-28 DIAGNOSIS — N189 Chronic kidney disease, unspecified: Secondary | ICD-10-CM | POA: Diagnosis not present

## 2018-01-28 DIAGNOSIS — E119 Type 2 diabetes mellitus without complications: Secondary | ICD-10-CM | POA: Diagnosis not present

## 2018-01-28 DIAGNOSIS — R269 Unspecified abnormalities of gait and mobility: Secondary | ICD-10-CM | POA: Diagnosis not present

## 2018-01-28 DIAGNOSIS — I5033 Acute on chronic diastolic (congestive) heart failure: Secondary | ICD-10-CM | POA: Diagnosis not present

## 2018-02-04 DIAGNOSIS — Z85038 Personal history of other malignant neoplasm of large intestine: Secondary | ICD-10-CM | POA: Diagnosis not present

## 2018-02-04 DIAGNOSIS — I5033 Acute on chronic diastolic (congestive) heart failure: Secondary | ICD-10-CM | POA: Diagnosis not present

## 2018-02-04 DIAGNOSIS — Z87891 Personal history of nicotine dependence: Secondary | ICD-10-CM | POA: Diagnosis not present

## 2018-02-04 DIAGNOSIS — Z9181 History of falling: Secondary | ICD-10-CM | POA: Diagnosis not present

## 2018-02-04 DIAGNOSIS — Z7982 Long term (current) use of aspirin: Secondary | ICD-10-CM | POA: Diagnosis not present

## 2018-02-04 DIAGNOSIS — N183 Chronic kidney disease, stage 3 (moderate): Secondary | ICD-10-CM | POA: Diagnosis not present

## 2018-02-04 DIAGNOSIS — I130 Hypertensive heart and chronic kidney disease with heart failure and stage 1 through stage 4 chronic kidney disease, or unspecified chronic kidney disease: Secondary | ICD-10-CM | POA: Diagnosis not present

## 2018-02-04 DIAGNOSIS — E119 Type 2 diabetes mellitus without complications: Secondary | ICD-10-CM | POA: Diagnosis not present

## 2018-02-04 DIAGNOSIS — M1990 Unspecified osteoarthritis, unspecified site: Secondary | ICD-10-CM | POA: Diagnosis not present

## 2018-02-04 DIAGNOSIS — R319 Hematuria, unspecified: Secondary | ICD-10-CM | POA: Diagnosis not present

## 2018-02-04 DIAGNOSIS — C61 Malignant neoplasm of prostate: Secondary | ICD-10-CM | POA: Diagnosis not present

## 2018-02-11 DIAGNOSIS — Z87891 Personal history of nicotine dependence: Secondary | ICD-10-CM | POA: Diagnosis not present

## 2018-02-11 DIAGNOSIS — E119 Type 2 diabetes mellitus without complications: Secondary | ICD-10-CM | POA: Diagnosis not present

## 2018-02-11 DIAGNOSIS — M1990 Unspecified osteoarthritis, unspecified site: Secondary | ICD-10-CM | POA: Diagnosis not present

## 2018-02-11 DIAGNOSIS — I5033 Acute on chronic diastolic (congestive) heart failure: Secondary | ICD-10-CM | POA: Diagnosis not present

## 2018-02-11 DIAGNOSIS — C61 Malignant neoplasm of prostate: Secondary | ICD-10-CM | POA: Diagnosis not present

## 2018-02-11 DIAGNOSIS — R319 Hematuria, unspecified: Secondary | ICD-10-CM | POA: Diagnosis not present

## 2018-02-11 DIAGNOSIS — Z9181 History of falling: Secondary | ICD-10-CM | POA: Diagnosis not present

## 2018-02-11 DIAGNOSIS — N183 Chronic kidney disease, stage 3 (moderate): Secondary | ICD-10-CM | POA: Diagnosis not present

## 2018-02-11 DIAGNOSIS — Z85038 Personal history of other malignant neoplasm of large intestine: Secondary | ICD-10-CM | POA: Diagnosis not present

## 2018-02-11 DIAGNOSIS — Z7982 Long term (current) use of aspirin: Secondary | ICD-10-CM | POA: Diagnosis not present

## 2018-02-11 DIAGNOSIS — I130 Hypertensive heart and chronic kidney disease with heart failure and stage 1 through stage 4 chronic kidney disease, or unspecified chronic kidney disease: Secondary | ICD-10-CM | POA: Diagnosis not present

## 2018-02-13 DIAGNOSIS — J301 Allergic rhinitis due to pollen: Secondary | ICD-10-CM | POA: Diagnosis not present

## 2018-02-18 DIAGNOSIS — Z85038 Personal history of other malignant neoplasm of large intestine: Secondary | ICD-10-CM | POA: Diagnosis not present

## 2018-02-18 DIAGNOSIS — I5033 Acute on chronic diastolic (congestive) heart failure: Secondary | ICD-10-CM | POA: Diagnosis not present

## 2018-02-18 DIAGNOSIS — C61 Malignant neoplasm of prostate: Secondary | ICD-10-CM | POA: Diagnosis not present

## 2018-02-18 DIAGNOSIS — N183 Chronic kidney disease, stage 3 (moderate): Secondary | ICD-10-CM | POA: Diagnosis not present

## 2018-02-18 DIAGNOSIS — Z7982 Long term (current) use of aspirin: Secondary | ICD-10-CM | POA: Diagnosis not present

## 2018-02-18 DIAGNOSIS — E119 Type 2 diabetes mellitus without complications: Secondary | ICD-10-CM | POA: Diagnosis not present

## 2018-02-18 DIAGNOSIS — I130 Hypertensive heart and chronic kidney disease with heart failure and stage 1 through stage 4 chronic kidney disease, or unspecified chronic kidney disease: Secondary | ICD-10-CM | POA: Diagnosis not present

## 2018-02-18 DIAGNOSIS — M1990 Unspecified osteoarthritis, unspecified site: Secondary | ICD-10-CM | POA: Diagnosis not present

## 2018-02-18 DIAGNOSIS — R319 Hematuria, unspecified: Secondary | ICD-10-CM | POA: Diagnosis not present

## 2018-02-18 DIAGNOSIS — Z9181 History of falling: Secondary | ICD-10-CM | POA: Diagnosis not present

## 2018-02-18 DIAGNOSIS — Z87891 Personal history of nicotine dependence: Secondary | ICD-10-CM | POA: Diagnosis not present

## 2018-02-25 DIAGNOSIS — Z85038 Personal history of other malignant neoplasm of large intestine: Secondary | ICD-10-CM | POA: Diagnosis not present

## 2018-02-25 DIAGNOSIS — C61 Malignant neoplasm of prostate: Secondary | ICD-10-CM | POA: Diagnosis not present

## 2018-02-25 DIAGNOSIS — Z87891 Personal history of nicotine dependence: Secondary | ICD-10-CM | POA: Diagnosis not present

## 2018-02-25 DIAGNOSIS — I130 Hypertensive heart and chronic kidney disease with heart failure and stage 1 through stage 4 chronic kidney disease, or unspecified chronic kidney disease: Secondary | ICD-10-CM | POA: Diagnosis not present

## 2018-02-25 DIAGNOSIS — R319 Hematuria, unspecified: Secondary | ICD-10-CM | POA: Diagnosis not present

## 2018-02-25 DIAGNOSIS — N183 Chronic kidney disease, stage 3 (moderate): Secondary | ICD-10-CM | POA: Diagnosis not present

## 2018-02-25 DIAGNOSIS — E119 Type 2 diabetes mellitus without complications: Secondary | ICD-10-CM | POA: Diagnosis not present

## 2018-02-25 DIAGNOSIS — Z9181 History of falling: Secondary | ICD-10-CM | POA: Diagnosis not present

## 2018-02-25 DIAGNOSIS — I5033 Acute on chronic diastolic (congestive) heart failure: Secondary | ICD-10-CM | POA: Diagnosis not present

## 2018-02-25 DIAGNOSIS — Z7982 Long term (current) use of aspirin: Secondary | ICD-10-CM | POA: Diagnosis not present

## 2018-02-25 DIAGNOSIS — M1990 Unspecified osteoarthritis, unspecified site: Secondary | ICD-10-CM | POA: Diagnosis not present

## 2018-03-08 DIAGNOSIS — N183 Chronic kidney disease, stage 3 (moderate): Secondary | ICD-10-CM | POA: Diagnosis not present

## 2018-03-08 DIAGNOSIS — R319 Hematuria, unspecified: Secondary | ICD-10-CM | POA: Diagnosis not present

## 2018-03-08 DIAGNOSIS — Z9181 History of falling: Secondary | ICD-10-CM | POA: Diagnosis not present

## 2018-03-08 DIAGNOSIS — Z87891 Personal history of nicotine dependence: Secondary | ICD-10-CM | POA: Diagnosis not present

## 2018-03-08 DIAGNOSIS — C61 Malignant neoplasm of prostate: Secondary | ICD-10-CM | POA: Diagnosis not present

## 2018-03-08 DIAGNOSIS — I130 Hypertensive heart and chronic kidney disease with heart failure and stage 1 through stage 4 chronic kidney disease, or unspecified chronic kidney disease: Secondary | ICD-10-CM | POA: Diagnosis not present

## 2018-03-08 DIAGNOSIS — M1990 Unspecified osteoarthritis, unspecified site: Secondary | ICD-10-CM | POA: Diagnosis not present

## 2018-03-08 DIAGNOSIS — E119 Type 2 diabetes mellitus without complications: Secondary | ICD-10-CM | POA: Diagnosis not present

## 2018-03-08 DIAGNOSIS — Z85038 Personal history of other malignant neoplasm of large intestine: Secondary | ICD-10-CM | POA: Diagnosis not present

## 2018-03-08 DIAGNOSIS — Z7982 Long term (current) use of aspirin: Secondary | ICD-10-CM | POA: Diagnosis not present

## 2018-03-08 DIAGNOSIS — I5033 Acute on chronic diastolic (congestive) heart failure: Secondary | ICD-10-CM | POA: Diagnosis not present

## 2018-03-14 DIAGNOSIS — E119 Type 2 diabetes mellitus without complications: Secondary | ICD-10-CM | POA: Diagnosis not present

## 2018-03-14 DIAGNOSIS — N183 Chronic kidney disease, stage 3 (moderate): Secondary | ICD-10-CM | POA: Diagnosis not present

## 2018-03-14 DIAGNOSIS — C61 Malignant neoplasm of prostate: Secondary | ICD-10-CM | POA: Diagnosis not present

## 2018-03-14 DIAGNOSIS — R319 Hematuria, unspecified: Secondary | ICD-10-CM | POA: Diagnosis not present

## 2018-03-14 DIAGNOSIS — I5033 Acute on chronic diastolic (congestive) heart failure: Secondary | ICD-10-CM | POA: Diagnosis not present

## 2018-03-14 DIAGNOSIS — M1990 Unspecified osteoarthritis, unspecified site: Secondary | ICD-10-CM | POA: Diagnosis not present

## 2018-03-14 DIAGNOSIS — Z87891 Personal history of nicotine dependence: Secondary | ICD-10-CM | POA: Diagnosis not present

## 2018-03-14 DIAGNOSIS — Z7982 Long term (current) use of aspirin: Secondary | ICD-10-CM | POA: Diagnosis not present

## 2018-03-14 DIAGNOSIS — Z85038 Personal history of other malignant neoplasm of large intestine: Secondary | ICD-10-CM | POA: Diagnosis not present

## 2018-03-14 DIAGNOSIS — I130 Hypertensive heart and chronic kidney disease with heart failure and stage 1 through stage 4 chronic kidney disease, or unspecified chronic kidney disease: Secondary | ICD-10-CM | POA: Diagnosis not present

## 2018-03-14 DIAGNOSIS — Z9181 History of falling: Secondary | ICD-10-CM | POA: Diagnosis not present

## 2018-03-24 DIAGNOSIS — I10 Essential (primary) hypertension: Secondary | ICD-10-CM | POA: Diagnosis not present

## 2018-03-24 DIAGNOSIS — E1169 Type 2 diabetes mellitus with other specified complication: Secondary | ICD-10-CM | POA: Diagnosis not present

## 2018-03-24 DIAGNOSIS — E118 Type 2 diabetes mellitus with unspecified complications: Secondary | ICD-10-CM | POA: Diagnosis not present

## 2018-03-24 DIAGNOSIS — R079 Chest pain, unspecified: Secondary | ICD-10-CM | POA: Diagnosis not present

## 2018-03-24 DIAGNOSIS — I509 Heart failure, unspecified: Secondary | ICD-10-CM | POA: Diagnosis not present

## 2018-04-01 DIAGNOSIS — C61 Malignant neoplasm of prostate: Secondary | ICD-10-CM | POA: Diagnosis not present

## 2018-04-01 DIAGNOSIS — N401 Enlarged prostate with lower urinary tract symptoms: Secondary | ICD-10-CM | POA: Diagnosis not present

## 2018-06-24 DIAGNOSIS — I5020 Unspecified systolic (congestive) heart failure: Secondary | ICD-10-CM | POA: Diagnosis not present

## 2018-06-24 DIAGNOSIS — M130 Polyarthritis, unspecified: Secondary | ICD-10-CM | POA: Diagnosis not present

## 2018-06-24 DIAGNOSIS — I10 Essential (primary) hypertension: Secondary | ICD-10-CM | POA: Diagnosis not present

## 2018-06-24 DIAGNOSIS — N189 Chronic kidney disease, unspecified: Secondary | ICD-10-CM | POA: Diagnosis not present

## 2018-06-24 DIAGNOSIS — J449 Chronic obstructive pulmonary disease, unspecified: Secondary | ICD-10-CM | POA: Diagnosis not present

## 2018-07-25 ENCOUNTER — Telehealth: Payer: Self-pay | Admitting: Oncology

## 2018-07-25 ENCOUNTER — Encounter: Payer: Self-pay | Admitting: Oncology

## 2018-07-25 NOTE — Telephone Encounter (Signed)
New referral received from Dr. Alyson Ingles at [facility] for prostate cancer. I cld and spoke to the pt's daughter and scheduled [name] to see Dr. Alen Blew on 10/16 at [facility]. Letter mailed.

## 2018-08-06 ENCOUNTER — Inpatient Hospital Stay: Payer: Medicare Other | Attending: Oncology | Admitting: Oncology

## 2018-08-06 ENCOUNTER — Telehealth: Payer: Self-pay | Admitting: Oncology

## 2018-08-06 ENCOUNTER — Inpatient Hospital Stay: Payer: Medicare Other

## 2018-08-06 VITALS — BP 158/62 | HR 58 | Temp 97.5°F | Resp 17 | Ht 66.0 in | Wt 181.8 lb

## 2018-08-06 DIAGNOSIS — C7951 Secondary malignant neoplasm of bone: Secondary | ICD-10-CM | POA: Diagnosis not present

## 2018-08-06 DIAGNOSIS — C61 Malignant neoplasm of prostate: Secondary | ICD-10-CM | POA: Diagnosis present

## 2018-08-06 DIAGNOSIS — Z923 Personal history of irradiation: Secondary | ICD-10-CM | POA: Insufficient documentation

## 2018-08-06 DIAGNOSIS — E348 Other specified endocrine disorders: Secondary | ICD-10-CM

## 2018-08-06 DIAGNOSIS — M1990 Unspecified osteoarthritis, unspecified site: Secondary | ICD-10-CM | POA: Diagnosis not present

## 2018-08-06 DIAGNOSIS — Z7982 Long term (current) use of aspirin: Secondary | ICD-10-CM | POA: Insufficient documentation

## 2018-08-06 DIAGNOSIS — Z79899 Other long term (current) drug therapy: Secondary | ICD-10-CM

## 2018-08-06 DIAGNOSIS — Z79818 Long term (current) use of other agents affecting estrogen receptors and estrogen levels: Secondary | ICD-10-CM | POA: Insufficient documentation

## 2018-08-06 DIAGNOSIS — E119 Type 2 diabetes mellitus without complications: Secondary | ICD-10-CM | POA: Diagnosis not present

## 2018-08-06 DIAGNOSIS — I10 Essential (primary) hypertension: Secondary | ICD-10-CM | POA: Insufficient documentation

## 2018-08-06 DIAGNOSIS — Z87891 Personal history of nicotine dependence: Secondary | ICD-10-CM | POA: Insufficient documentation

## 2018-08-06 DIAGNOSIS — Z85038 Personal history of other malignant neoplasm of large intestine: Secondary | ICD-10-CM | POA: Diagnosis not present

## 2018-08-06 LAB — CMP (CANCER CENTER ONLY)
ALT: 8 U/L (ref 0–44)
AST: 18 U/L (ref 15–41)
Albumin: 3.6 g/dL (ref 3.5–5.0)
Alkaline Phosphatase: 94 U/L (ref 38–126)
Anion gap: 11 (ref 5–15)
BUN: 59 mg/dL — ABNORMAL HIGH (ref 8–23)
CHLORIDE: 107 mmol/L (ref 98–111)
CO2: 22 mmol/L (ref 22–32)
CREATININE: 2.31 mg/dL — AB (ref 0.61–1.24)
Calcium: 9.9 mg/dL (ref 8.9–10.3)
GFR, EST AFRICAN AMERICAN: 27 mL/min — AB (ref >60)
GFR, EST NON AFRICAN AMERICAN: 23 mL/min — AB (ref >60)
Glucose, Bld: 86 mg/dL (ref 70–99)
POTASSIUM: 4.3 mmol/L (ref 3.5–5.1)
Sodium: 140 mmol/L (ref 135–145)
Total Bilirubin: 0.5 mg/dL (ref 0.3–1.2)
Total Protein: 7.8 g/dL (ref 6.5–8.1)

## 2018-08-06 LAB — CBC WITH DIFFERENTIAL (CANCER CENTER ONLY)
Abs Immature Granulocytes: 0.01 10*3/uL (ref 0.00–0.07)
BASOS ABS: 0 10*3/uL (ref 0.0–0.1)
Basophils Relative: 1 %
EOS PCT: 6 %
Eosinophils Absolute: 0.2 10*3/uL (ref 0.0–0.5)
HCT: 36.9 % — ABNORMAL LOW (ref 39.0–52.0)
HEMOGLOBIN: 12.2 g/dL — AB (ref 13.0–17.0)
Immature Granulocytes: 0 %
LYMPHS PCT: 36 %
Lymphs Abs: 1.4 10*3/uL (ref 0.7–4.0)
MCH: 27.1 pg (ref 26.0–34.0)
MCHC: 33.1 g/dL (ref 30.0–36.0)
MCV: 81.8 fL (ref 80.0–100.0)
Monocytes Absolute: 0.4 10*3/uL (ref 0.1–1.0)
Monocytes Relative: 9 %
NEUTROS ABS: 2 10*3/uL (ref 1.7–7.7)
Neutrophils Relative %: 48 %
Platelet Count: 220 10*3/uL (ref 150–400)
RBC: 4.51 MIL/uL (ref 4.22–5.81)
RDW: 14.9 % (ref 11.5–15.5)
WBC: 4 10*3/uL (ref 4.0–10.5)
nRBC: 0 % (ref 0.0–0.2)

## 2018-08-06 NOTE — Progress Notes (Signed)
Reason for the request: Prostate cancer  HPI: I was asked by Dr. Alyson Ingles to evaluate [name] for prostate cancer.  He is a 84 year old man with history of colon cancer diagnosed in 2008.  At that time he was found to have stage II disease and underwent a laparoscopic right hemicolectomy without any evidence of recurrent disease since that time.  He was diagnosed with prostate cancer although the details of his original diagnosis and treatment are not clear.  He received radiation therapy as a definitive modality.  He has been followed by Dr. Janice Norrie and subsequently Dr. Alyson Ingles.  He developed recurrent disease treated with androgen deprivation therapy alone and subsequently [name] was added on May 20, 2016.  His PSA dropped from originally 4 down to 3 at that time.  His PSA continues to be intermittently between 3 and 4 between 2017 and 2019.  He has been intermittently taking Xtandi in the interim.  His most recent PSA in September 2019 was up to 12.2.  He is asymptomatic from his prostate cancer at this time.  Ambulates with the help of a walker without any recent falls or syncope.  He lives with his daughter although he spends most of the day with his other daughter.  He denies any bone pain or pathological fractures.  He denies any worsening urinary symptoms.  He has been erratic in taking Xtandi and has not taken it as of late.  He reported symptoms of nausea and fatigue associated with it.  He does not report any headaches, blurry vision, syncope or seizures. Does not report any fevers, chills or sweats.  Does not report any cough, wheezing or hemoptysis.  Does not report any chest pain, palpitation, orthopnea or leg edema.  Does not report any nausea, vomiting or abdominal pain.  Does not report any constipation or diarrhea.  Does not report any skeletal complaints.    Does not report frequency, urgency or hematuria.  Does not report any skin rashes or lesions. Does not report any heat or cold  intolerance.  Does not report any lymphadenopathy or petechiae.  Does not report any anxiety or depression.  Remaining review of systems is negative.    Past Medical History:  Diagnosis Date  . Adenomatous polyps 09/09/2008  . Arthritis   . Carpal tunnel syndrome, bilateral 11/21/2016  . Colon cancer ([place]) dx'd 2008   surg only  . Diabetes mellitus without complication ([place])   . Falls frequently   . Hypertension   . Prostate cancer ([place]) dx'd 1993   surg only  :  Past Surgical History:  Procedure Laterality Date  . CATARACT EXTRACTION W/PHACO Left 06/10/2013   Procedure: CATARACT EXTRACTION PHACO AND INTRAOCULAR LENS PLACEMENT (IOC);  Surgeon: Adonis Brook, MD;  Location: [facility];  Service: Ophthalmology;  Laterality: Left;  . COLONOSCOPY    . ESOPHAGOGASTRODUODENOSCOPY    . EYE SURGERY     cataract surgery, right eye  . laparoscopic assisted right hemicolectomy  09/21/07  :   Current Outpatient Medications:  .  amLODipine (NORVASC) 10 MG tablet, Take 10 mg by mouth daily., Disp: , Rfl:  .  aspirin EC 81 MG tablet, Take 1 tablet (81 mg total) by mouth daily., Disp: 60 tablet, Rfl: 0 .  citalopram (CELEXA) 10 MG tablet, Take 10 mg by mouth daily., Disp: , Rfl:  .  furosemide (LASIX) 40 MG tablet, Take 1 tablet (40 mg total) by mouth daily. (take for 3 days then stop and follow up with  PCP to determine whether to continue), Disp: 30 tablet, Rfl: 0 .  potassium chloride (KLOR-CON) 20 MEQ packet, Take 20 mEq by mouth daily. (take for 3 days, then stop and follow up labs with PCP to determine whether to continue), Disp: 30 packet, Rfl: 0 .  XTANDI 40 MG capsule, Take 40 mg by mouth daily., Disp: , Rfl: :  No Known Allergies:  Family History  Problem Relation Age of Onset  . Diabetes Unknown   :  Social History   Socioeconomic History  . Marital status: Widowed    Spouse name: Not on file  . Number of children: 3  . Years of education: Not on file  . Highest education level:  Not on file  Occupational History  . Occupation: retired    Fish farm manager: RETIRED  Social Needs  . Financial resource strain: Not on file  . Food insecurity:    Worry: Not on file    Inability: Not on file  . Transportation needs:    Medical: Not on file    Non-medical: Not on file  Tobacco Use  . Smoking status: Former Smoker    Years: 1.00    Types: Cigarettes    Last attempt to quit: 06/10/1983    Years since quitting: 35.1  . Smokeless tobacco: Never Used  Substance and Sexual Activity  . Alcohol use: Yes    Comment: occasional wine  . Drug use: No  . Sexual activity: Not on file  Lifestyle  . Physical activity:    Days per week: Not on file    Minutes per session: Not on file  . Stress: Not on file  Relationships  . Social connections:    Talks on phone: Not on file    Gets together: Not on file    Attends religious service: Not on file    Active member of club or organization: Not on file    Attends meetings of clubs or organizations: Not on file    Relationship status: Not on file  . Intimate partner violence:    Fear of current or ex partner: Not on file    Emotionally abused: Not on file    Physically abused: Not on file    Forced sexual activity: Not on file  Other Topics Concern  . Not on file  Social History Narrative  . Not on file  :  Pertinent items are noted in HPI.  Exam: Blood pressure (!) 158/62, pulse (!) 58, temperature (!) 97.5 F (36.4 C), temperature source Oral, resp. rate 17, height 5\' 6"  (1.676 m), weight 181 lb 12.8 oz (82.5 kg), SpO2 99 %.   ECOG 2 General appearance: alert and cooperative appeared without distress. Head: atraumatic without any abnormalities. Eyes: conjunctivae/corneas clear. PERRL.  Sclera anicteric. Throat: lips, mucosa, and tongue normal; without oral thrush or ulcers. Resp: clear to auscultation bilaterally without rhonchi, wheezes or dullness to percussion. Cardio: regular rate and rhythm, S1, S2 normal, no  murmur, click, rub or gallop GI: soft, non-tender; bowel sounds normal; no masses,  no organomegaly Skin: Skin color, texture, turgor normal. No rashes or lesions Lymph nodes: Cervical, supraclavicular, and axillary nodes normal. Neurologic: Grossly normal without any motor, sensory or deep tendon reflexes. Musculoskeletal: No joint deformity or effusion.   CBC    Component Value Date/Time   WBC 3.4 (L) 12/05/2017 0430   RBC 3.56 (L) 12/05/2017 0430   HGB 10.3 (L) 12/05/2017 0430   HGB 12.7 (L) 10/28/2013 1058   HCT  29.5 (L) 12/05/2017 0430   HCT 37.9 (L) 10/28/2013 1058   PLT 165 12/05/2017 0430   PLT 242 10/28/2013 1058   MCV 82.9 12/05/2017 0430   MCV 81.3 10/28/2013 1058   MCH 28.9 12/05/2017 0430   MCHC 34.9 12/05/2017 0430   RDW 15.8 (H) 12/05/2017 0430   RDW 15.9 (H) 10/28/2013 1058   LYMPHSABS 0.6 (L) 12/02/2017 1940   LYMPHSABS 2.3 10/28/2013 1058   MONOABS 0.3 12/02/2017 1940   MONOABS 0.5 10/28/2013 1058   EOSABS 0.1 12/02/2017 1940   EOSABS 0.2 10/28/2013 1058   BASOSABS 0.0 12/02/2017 1940   BASOSABS 0.0 10/28/2013 1058     Chemistry      Component Value Date/Time   NA 142 12/05/2017 0430   NA 144 10/28/2013 1059   K 3.2 (L) 12/05/2017 0430   K 3.6 10/28/2013 1059   CL 103 12/05/2017 0430   CL 103 10/24/2012 1017   CO2 26 12/05/2017 0430   CO2 29 10/28/2013 1059   BUN 26 (H) 12/05/2017 0430   BUN 44.9 (H) 10/28/2013 1059   CREATININE 2.02 (H) 12/05/2017 0430   CREATININE 1.9 (H) 10/28/2013 1059      Component Value Date/Time   CALCIUM 9.1 12/05/2017 0430   CALCIUM 10.2 10/28/2013 1059   ALKPHOS 79 10/23/2017 1811   ALKPHOS 69 10/28/2013 1059   AST 36 10/23/2017 1811   AST 14 10/28/2013 1059   ALT 19 10/23/2017 1811   ALT 9 10/28/2013 1059   BILITOT 1.3 (H) 10/23/2017 1811   BILITOT 0.73 10/28/2013 1059     EXAM: NUCLEAR MEDICINE WHOLE BODY BONE SCAN  TECHNIQUE: Whole body anterior and posterior images were obtained approximately 3  hours after intravenous injection of radiopharmaceutical.  RADIOPHARMACEUTICALS:  25.0 mCi Technetium-99 MDP  COMPARISON:  Total body bone scan of 02/18/2014  FINDINGS: The patient was injected with 25.0 mCi of technetium 16 M MDP intravenously and total body bone scan was performed. This study is compared to the prior bone scan of 02/18/2014. There are several foci of increased activity which have become more intense in the interval as well as several new foci. A small focus of activity is noted in the lumbar spine in the region of L3 possibly involving the pedicle which is increased in activity. A small focus of activity on the right at L5-S1 also has increased in activity. Several small foci of activity are noted in the region of the left SI joint as well as the left pubic ramus and the right superior pubic ramus. On the frontal view there is a small focus of activity corresponding to an anterior costochondral junction of a mid left rib. These areas are worrisome for progressive metastatic disease.  IMPRESSION: Some increased activity at sites noted previously with some new activity noted worrisome for progression of metastases involving primarily the mid to lower lumbar spine and the bones of the pelvis.   Assessment and Plan:    84 year old man with the following:  1.  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  He has been on androgen deprivation under the care of Dr. Alyson Ingles as well as [name] that was added in 2017.  His last bone scan in 2016 showed increased bone activity.  The natural course of this disease was discussed today with the patient and treatment options were reviewed.  The first step is to document the extent of his metastatic disease with restaging with a CT scan and a bone scan.  At that time we will determine the best course of action.  His options would include systemic chemotherapy, Xofigo and to a lesser extent Zytiga.   It is unclear to me that he has progressed on Xtandi or he is not taking it because of poor tolerance.  It is reasonable to restart it at a lower dose also as an alternative option.  After discussion today, he is agreeable to undergo staging work-up and will determine the best course of action.  I also offered him supportive care only without any further cancer treatment and at this time he is interested in pursuing staging and potential treatments.  2.  Androgen deprivation: I recommended continuing Lupron under the care of Dr. Alyson Ingles which he is continue to take.  3.  Follow-up: We will be in the next few weeks after completing staging work-up.  60  minutes was spent with the patient face-to-face today.  More than 50% of time was dedicated to reviewing the natural course of disease, reviewing imaging studies and discussing options of therapy.    Thank you for the referral. A copy of this consult has been forwarded to the requesting physician.

## 2018-08-06 NOTE — Telephone Encounter (Signed)
Gave pt avs and calendar  °

## 2018-08-07 LAB — PROSTATE-SPECIFIC AG, SERUM (LABCORP): Prostate Specific Ag, Serum: 21.3 ng/mL — ABNORMAL HIGH (ref 0.0–4.0)

## 2018-08-29 ENCOUNTER — Inpatient Hospital Stay: Payer: Medicare Other | Admitting: Oncology

## 2018-09-17 ENCOUNTER — Inpatient Hospital Stay: Payer: Medicare Other | Attending: Oncology | Admitting: Oncology

## 2018-09-17 VITALS — BP 167/51 | HR 60 | Temp 97.7°F | Resp 17 | Ht 66.0 in | Wt 187.1 lb

## 2018-09-17 DIAGNOSIS — Z79818 Long term (current) use of other agents affecting estrogen receptors and estrogen levels: Secondary | ICD-10-CM | POA: Insufficient documentation

## 2018-09-17 DIAGNOSIS — C7951 Secondary malignant neoplasm of bone: Secondary | ICD-10-CM | POA: Diagnosis not present

## 2018-09-17 DIAGNOSIS — Z7982 Long term (current) use of aspirin: Secondary | ICD-10-CM | POA: Diagnosis not present

## 2018-09-17 DIAGNOSIS — Z923 Personal history of irradiation: Secondary | ICD-10-CM | POA: Diagnosis not present

## 2018-09-17 DIAGNOSIS — C61 Malignant neoplasm of prostate: Secondary | ICD-10-CM | POA: Diagnosis present

## 2018-09-17 DIAGNOSIS — Z79899 Other long term (current) drug therapy: Secondary | ICD-10-CM | POA: Diagnosis not present

## 2018-09-17 NOTE — Progress Notes (Signed)
Hematology and Oncology Follow Up Visit  [name] [id number] May 08, 1924 84 y.o. 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  He was found to have prostate cancer in 2008.   Prior Therapy:   He received radiation therapy as a definitive modality at the time of diagnosis.  He developed recurrent disease treated with androgen deprivation therapy alone and subsequently [name] was added on May 20, 2016.  His PSA dropped from originally 4 down to 3 at that time.  His PSA continues to be intermittently between 3 and 4 between 2017 and 2019.  He has been intermittently taking Xtandi in the interim.  His most recent PSA in September 2019 was up to 12.2.  Current therapy: Under evaluation for additional therapy.  [name] has been on hold.  Interim History: [name] presents today for a follow-up visit.  Since last visit, he reports no major complaints.  He has not been taking Xtandi because of poor tolerance.  He had issues with dyspepsia and stomach discomfort.  After medication, he has been thriving reasonably well and ambulating with the help of a walker.  His performance status is marginal although has not dramatically changed.  His quality of life is limited but not declining.  He denies any worsening back pain or pathological fractures.  He does not report any headaches, blurry vision, syncope or seizures.  Denies any alteration mental status or confusion.  Does not report any fevers, chills or sweats.  Does not report any cough, wheezing or hemoptysis.  Does not report any chest pain, palpitation, orthopnea or leg edema.  Does not report any nausea, vomiting or abdominal pain.  Does not report any constipation or diarrhea.  Does not report any arthralgias or myalgias.   Does not report frequency, urgency or hematuria.  Does not report any skin rashes or lesions.  Does not report any  lymphadenopathy or petechiae.  Does not report any anxiety or depression.  Remaining review of systems is negative.    Medications: I have reviewed the patient's current medications.  Current Outpatient Medications  Medication Sig Dispense Refill  . amLODipine (NORVASC) 10 MG tablet Take 10 mg by mouth daily.    Marland Kitchen aspirin EC 81 MG tablet Take 1 tablet (81 mg total) by mouth daily. 60 tablet 0  . citalopram (CELEXA) 10 MG tablet Take 10 mg by mouth daily.    . furosemide (LASIX) 40 MG tablet Take 1 tablet (40 mg total) by mouth daily. (take for 3 days then stop and follow up with PCP to determine whether to continue) 30 tablet 0  . potassium chloride (KLOR-CON) 20 MEQ packet Take 20 mEq by mouth daily. (take for 3 days, then stop and follow up labs with PCP to determine whether to continue) 30 packet 0  . XTANDI 40 MG capsule Take 40 mg by mouth daily.     No current facility-administered medications for this visit.      Allergies: No Known Allergies  Past Medical History, Surgical history, Social history, and Family History were reviewed and updated.  Review of Systems: .  Physical Exam: Blood pressure (!) 167/51, pulse 60, temperature 97.7 F (36.5 C), temperature source Oral, resp. rate 17, height 5\' 6"  (1.676 m), weight 187 lb 1.6 oz (84.9 kg), SpO2 100 %.   ECOG:  2 General appearance: alert and cooperative appeared without distress. Head: Normocephalic, without obvious abnormality Oropharynx: No  oral thrush or ulcers. Eyes: No scleral icterus.  Pupils are equal and round reactive to light. Lymph nodes: Cervical, supraclavicular, and axillary nodes normal. Heart:regular rate and rhythm, S1, S2 normal, no murmur, click, rub or gallop Lung:chest clear, no wheezing, rales, normal symmetric air entry Abdomin: soft, non-tender, without masses or organomegaly. Neurological: No motor, sensory deficits.  Intact deep tendon reflexes. Skin: No rashes or lesions.  No ecchymosis or  petechiae. Musculoskeletal: No joint deformity or effusion. Psychiatric: Mood and affect are appropriate.    Lab Results: Lab Results  Component Value Date   WBC 4.0 08/06/2018   HGB 12.2 (L) 08/06/2018   HCT 36.9 (L) 08/06/2018   MCV 81.8 08/06/2018   PLT 220 08/06/2018     Chemistry      Component Value Date/Time   NA 140 08/06/2018 1104   NA 144 10/28/2013 1059   K 4.3 08/06/2018 1104   K 3.6 10/28/2013 1059   CL 107 08/06/2018 1104   CL 103 10/24/2012 1017   CO2 22 08/06/2018 1104   CO2 29 10/28/2013 1059   BUN 59 (H) 08/06/2018 1104   BUN 44.9 (H) 10/28/2013 1059   CREATININE 2.31 (H) 08/06/2018 1104   CREATININE 1.9 (H) 10/28/2013 1059      Component Value Date/Time   CALCIUM 9.9 08/06/2018 1104   CALCIUM 10.2 10/28/2013 1059   ALKPHOS 94 08/06/2018 1104   ALKPHOS 69 10/28/2013 1059   AST 18 08/06/2018 1104   AST 14 10/28/2013 1059   ALT 8 08/06/2018 1104   ALT 9 10/28/2013 1059   BILITOT 0.5 08/06/2018 1104   BILITOT 0.73 10/28/2013 1059      Results for [name], [name] (MRN [id number]) as of 09/17/2018 14:36  Ref. Range 10/28/2013 10:58 08/06/2018 11:04  CEA Latest Ref Range: 0.0 - 5.0 ng/mL 2.1   Prostate Specific Ag, Serum Latest Ref Range: 0.0 - 4.0 ng/mL  21.3 (H)      Impression and Plan:  84 year old man with the following:  1.  Castration-resistant prostate cancer diagnosed in 2017.  He has known disease to the bone that has been noted since 2016.    He has been on Xtandi in the past with a PSA slowly rising at this time.  Options of therapy were reviewed today with the patient and his family.  These would include restarting Xtandi at a lower dose, systemic chemotherapy, Xofigo or best supportive care.  His performance status is rather marginal and his quality of life is unchanged.  He is asymptomatic from his cancer with a PSA that is slowly rising.  After discussion today, we have opted to continue with supportive care only in addition to  androgen deprivation.  If he develops worsening disease or symptoms palliative radiation therapy could be a consideration.  2.  Androgen deprivation: I recommended continuing this indefinitely which she takes under the care of Dr. Alyson Ingles.  3.  Follow-up: We will be in 3 months to follow his progress.  15  minutes was spent with the patient face-to-face today.  More than 50% of time was dedicated to discussing his disease status, treatment options complications related to therapy and answering questions regarding plan of care.      Zola Button, MD 11/27/20193:25 PM

## 2018-10-20 ENCOUNTER — Other Ambulatory Visit: Payer: Self-pay | Admitting: Urology

## 2018-10-20 ENCOUNTER — Other Ambulatory Visit (HOSPITAL_COMMUNITY): Payer: Self-pay | Admitting: Urology

## 2018-10-20 DIAGNOSIS — C61 Malignant neoplasm of prostate: Secondary | ICD-10-CM

## 2018-11-07 ENCOUNTER — Encounter (HOSPITAL_COMMUNITY)
Admission: RE | Admit: 2018-11-07 | Discharge: 2018-11-07 | Disposition: A | Discharge status: Home / Self Care | Payer: Medicare Other | Source: Ambulatory Visit | Attending: Urology | Admitting: Urology

## 2018-11-07 DIAGNOSIS — C61 Malignant neoplasm of prostate: Secondary | ICD-10-CM | POA: Insufficient documentation

## 2018-11-07 MED ORDER — TECHNETIUM TC 99M MEDRONATE IV KIT
20.5000 | PACK | Freq: Once | INTRAVENOUS | Status: AC | PRN
  Administered 2018-11-07: 20.5 via INTRAVENOUS

## 2018-11-14 ENCOUNTER — Emergency Department (HOSPITAL_COMMUNITY)
Admission: EM | Admit: 2018-11-14 | Discharge: 2018-11-14 | Disposition: A | Discharge status: Home / Self Care | Payer: Medicare Other | Attending: Emergency Medicine | Admitting: Emergency Medicine

## 2018-11-14 ENCOUNTER — Encounter (HOSPITAL_COMMUNITY): Payer: Self-pay | Admitting: Emergency Medicine

## 2018-11-14 DIAGNOSIS — Z7982 Long term (current) use of aspirin: Secondary | ICD-10-CM | POA: Insufficient documentation

## 2018-11-14 DIAGNOSIS — I110 Hypertensive heart disease with heart failure: Secondary | ICD-10-CM | POA: Diagnosis not present

## 2018-11-14 DIAGNOSIS — Z87891 Personal history of nicotine dependence: Secondary | ICD-10-CM | POA: Insufficient documentation

## 2018-11-14 DIAGNOSIS — I509 Heart failure, unspecified: Secondary | ICD-10-CM | POA: Diagnosis not present

## 2018-11-14 DIAGNOSIS — R319 Hematuria, unspecified: Secondary | ICD-10-CM | POA: Insufficient documentation

## 2018-11-14 DIAGNOSIS — Z79899 Other long term (current) drug therapy: Secondary | ICD-10-CM | POA: Insufficient documentation

## 2018-11-14 DIAGNOSIS — E119 Type 2 diabetes mellitus without complications: Secondary | ICD-10-CM | POA: Diagnosis not present

## 2018-11-14 LAB — URINALYSIS, ROUTINE W REFLEX MICROSCOPIC
Bacteria, UA: NONE SEEN
RBC / HPF: >50 RBC/hpf — ABNORMAL HIGH (ref 0–5)

## 2018-11-14 NOTE — ED Provider Notes (Signed)
[facility] DEPT Provider Note   CSN: [id number] Arrival date & time: 11/14/18  1057     History   Chief Complaint Chief Complaint  Patient presents with  . Hematuria    HPI [name] is a 85 y.o. male.  The history is provided by the patient.  Hematuria  This is a new problem. The current episode started more than 2 days ago. The problem occurs daily. The problem has been gradually improving. Pertinent negatives include no chest pain, no abdominal pain, no headaches and no shortness of breath. Associated symptoms comments: Hx of prostate cancer. Nothing aggravates the symptoms. Nothing relieves the symptoms. He has tried nothing for the symptoms. The treatment provided no relief.    Past Medical History:  Diagnosis Date  . Adenomatous polyps 09/09/2008  . Arthritis   . Carpal tunnel syndrome, bilateral 11/21/2016  . Colon cancer ([place]) dx'd 2008   surg only  . Diabetes mellitus without complication ([place])   . Falls frequently   . Hypertension   . Prostate cancer ([place]) dx'd 1993   surg only    Patient Active Problem List   Diagnosis Date Noted  . Acute CHF (congestive heart failure) ([place]) 12/02/2017  . Hypothermia 12/02/2017  . Dyspnea 12/02/2017  . CHF (congestive heart failure) ([place]) 12/02/2017  . Carpal tunnel syndrome, bilateral 11/21/2016  . Falls frequently   . Vertigo 05/20/2014  . PROSTATE CANCER 08/19/2008  . DM 08/19/2008  . HYPERLIPIDEMIA 08/19/2008  . Essential hypertension 08/19/2008  . Personal history of colon cancer, stage II 08/19/2008    Past Surgical History:  Procedure Laterality Date  . CATARACT EXTRACTION W/PHACO Left 06/10/2013   Procedure: CATARACT EXTRACTION PHACO AND INTRAOCULAR LENS PLACEMENT (IOC);  Surgeon: Adonis Brook, MD;  Location: [facility];  Service: Ophthalmology;  Laterality: Left;  . COLONOSCOPY    . ESOPHAGOGASTRODUODENOSCOPY    . EYE SURGERY     cataract surgery, right eye  . laparoscopic  assisted right hemicolectomy  09/21/07        Home Medications    Prior to Admission medications   Medication Sig Start Date End Date Taking? Authorizing Provider  amLODipine (NORVASC) 10 MG tablet Take 10 mg by mouth daily.    [provider]  aspirin EC 81 MG tablet Take 1 tablet (81 mg total) by mouth daily. 05/22/14   Reyne Dumas, MD  citalopram (CELEXA) 10 MG tablet Take 10 mg by mouth daily. 11/24/17   [provider]  furosemide (LASIX) 40 MG tablet Take 1 tablet (40 mg total) by mouth daily. (take for 3 days then stop and follow up with PCP to determine whether to continue) 12/05/17 01/04/18  Elodia Florence., MD  potassium chloride (KLOR-CON) 20 MEQ packet Take 20 mEq by mouth daily. (take for 3 days, then stop and follow up labs with PCP to determine whether to continue) 12/05/17 01/04/18  Elodia Florence., MD  XTANDI 40 MG capsule Take 40 mg by mouth daily. 10/01/17   [provider]    Family History Family History  Problem Relation Age of Onset  . Diabetes Other     Social History Social History   Tobacco Use  . Smoking status: Former Smoker    Years: 1.00    Types: Cigarettes    Last attempt to quit: 06/10/1983    Years since quitting: 35.4  . Smokeless tobacco: Never Used  Substance Use Topics  . Alcohol use: Yes  Comment: occasional wine  . Drug use: No     Allergies   Patient has no known allergies.   Review of Systems Review of Systems  Constitutional: Negative for chills and fever.  HENT: Negative for ear pain and sore throat.   Eyes: Negative for pain and visual disturbance.  Respiratory: Negative for cough and shortness of breath.   Cardiovascular: Negative for chest pain and palpitations.  Gastrointestinal: Negative for abdominal pain and vomiting.  Genitourinary: Positive for hematuria. Negative for decreased urine volume, difficulty urinating, discharge, dysuria, enuresis, flank pain, frequency, genital  sores, penile pain, penile swelling, scrotal swelling, testicular pain and urgency.  Musculoskeletal: Negative for arthralgias and back pain.  Skin: Negative for color change and rash.  Neurological: Negative for seizures, syncope and headaches.  All other systems reviewed and are negative.    Physical Exam Updated Vital Signs  ED Triage Vitals  Enc Vitals Group     BP 11/14/18 1118 130/62     Pulse Rate 11/14/18 1118 (!) 56     Resp 11/14/18 1117 18     Temp 11/14/18 1118 97.8 F (36.6 C)     Temp Source 11/14/18 1118 Oral     SpO2 11/14/18 1117 100 %     Weight --      Height --      Head Circumference --      Peak Flow --      Pain Score 11/14/18 1117 0     Pain Loc --      Pain Edu? --      Excl. in [place]? --     Physical Exam   ED Treatments / Results  Labs (all labs ordered are listed, but only abnormal results are displayed) Labs Reviewed  URINALYSIS, ROUTINE W REFLEX MICROSCOPIC - Abnormal; Notable for the following components:      Result Value   Color, Urine RED (*)    APPearance HAZY (*)    Glucose, UA   (*)    Value: TEST NOT REPORTED DUE TO COLOR INTERFERENCE OF URINE PIGMENT   Hgb urine dipstick   (*)    Value: TEST NOT REPORTED DUE TO COLOR INTERFERENCE OF URINE PIGMENT   Bilirubin Urine   (*)    Value: TEST NOT REPORTED DUE TO COLOR INTERFERENCE OF URINE PIGMENT   Ketones, ur   (*)    Value: TEST NOT REPORTED DUE TO COLOR INTERFERENCE OF URINE PIGMENT   Protein, ur   (*)    Value: TEST NOT REPORTED DUE TO COLOR INTERFERENCE OF URINE PIGMENT   Nitrite   (*)    Value: TEST NOT REPORTED DUE TO COLOR INTERFERENCE OF URINE PIGMENT   Leukocytes, UA   (*)    Value: TEST NOT REPORTED DUE TO COLOR INTERFERENCE OF URINE PIGMENT   RBC / HPF >50 (*)    Non Squamous Epithelial 0-5 (*)    All other components within normal limits    EKG None  Radiology No results found.  Procedures Procedures (including critical care time)  Medications Ordered in  ED Medications - No data to display   Initial Impression / Assessment and Plan / ED Course  I have reviewed the triage vital signs and the nursing notes.  Pertinent labs & imaging results that were available during my care of the patient were reviewed by me and considered in my medical decision making (see chart for details).     [name] is a 85 year old male with  history of prostate cancer, colon cancer, diabetes who presents the ED with hematuria.  Patient with normal vitals.  No fever.  Patient has noticed blood in his urine for the last several days.  Denies any urinary retention.  No abdominal pain.  Overall is asymptomatic other than he is noticed some blood in his urine.  No pain with urination.  Patient takes a daily aspirin but no other anticoagulation.  No abdominal tenderness on exam.  Overall well-appearing.  Bladder scan showed less than 50 cc of urine.  Urine was grossly pink-tinged.  Urinalysis was overall unremarkable.  Given strict return precautions.  Recommend close follow-up with his urologist.  Told to return to the ED if he develops any urinary retention.  Understands instructions and was discharged in good condition.  This chart was dictated using voice recognition software.  Despite best efforts to proofread,  errors can occur which can change the documentation meaning.   Final Clinical Impressions(s) / ED Diagnoses   Final diagnoses:  Hematuria, unspecified type    ED Discharge Orders    None       Lennice Sites, DO 11/14/18 1341

## 2018-11-14 NOTE — Discharge Instructions (Signed)
Please follow-up with your urologist.  Return to the ED if symptoms worsen.  Please return if you develop any urinary retention.

## 2018-11-14 NOTE — ED Triage Notes (Signed)
Pt c/o dark blood in urine for past 3 days. Denies pain or taking blood thinners.

## 2018-11-14 NOTE — ED Notes (Signed)
Patient given discharge teaching and verbalized understanding. Patient ambulated out of ED with a steady gait. 

## 2018-11-17 ENCOUNTER — Telehealth: Payer: Self-pay | Admitting: Oncology

## 2018-11-17 NOTE — Telephone Encounter (Signed)
[facility] called to reschedule, per daughter, cancel for now, will call back to reschedule

## 2018-11-19 DIAGNOSIS — I10 Essential (primary) hypertension: Secondary | ICD-10-CM | POA: Diagnosis not present

## 2018-11-19 DIAGNOSIS — I110 Hypertensive heart disease with heart failure: Secondary | ICD-10-CM | POA: Diagnosis not present

## 2018-11-19 DIAGNOSIS — E1169 Type 2 diabetes mellitus with other specified complication: Secondary | ICD-10-CM | POA: Diagnosis not present

## 2018-11-26 DIAGNOSIS — R42 Dizziness and giddiness: Secondary | ICD-10-CM | POA: Diagnosis not present

## 2018-11-26 DIAGNOSIS — Z85038 Personal history of other malignant neoplasm of large intestine: Secondary | ICD-10-CM | POA: Diagnosis not present

## 2018-11-26 DIAGNOSIS — Z87891 Personal history of nicotine dependence: Secondary | ICD-10-CM | POA: Diagnosis not present

## 2018-11-26 DIAGNOSIS — Z9181 History of falling: Secondary | ICD-10-CM | POA: Diagnosis not present

## 2018-11-26 DIAGNOSIS — I5033 Acute on chronic diastolic (congestive) heart failure: Secondary | ICD-10-CM | POA: Diagnosis not present

## 2018-11-26 DIAGNOSIS — N183 Chronic kidney disease, stage 3 (moderate): Secondary | ICD-10-CM | POA: Diagnosis not present

## 2018-11-26 DIAGNOSIS — I130 Hypertensive heart and chronic kidney disease with heart failure and stage 1 through stage 4 chronic kidney disease, or unspecified chronic kidney disease: Secondary | ICD-10-CM | POA: Diagnosis not present

## 2018-11-26 DIAGNOSIS — E1122 Type 2 diabetes mellitus with diabetic chronic kidney disease: Secondary | ICD-10-CM | POA: Diagnosis not present

## 2018-11-26 DIAGNOSIS — G5603 Carpal tunnel syndrome, bilateral upper limbs: Secondary | ICD-10-CM | POA: Diagnosis not present

## 2018-11-30 ENCOUNTER — Emergency Department (HOSPITAL_COMMUNITY)
Admission: EM | Admit: 2018-11-30 | Discharge: 2018-11-30 | Disposition: A | Discharge status: Home / Self Care | Payer: Medicare Other | Attending: Emergency Medicine | Admitting: Emergency Medicine

## 2018-11-30 ENCOUNTER — Encounter (HOSPITAL_COMMUNITY): Payer: Self-pay | Admitting: Emergency Medicine

## 2018-11-30 DIAGNOSIS — Z8546 Personal history of malignant neoplasm of prostate: Secondary | ICD-10-CM | POA: Insufficient documentation

## 2018-11-30 DIAGNOSIS — Z7982 Long term (current) use of aspirin: Secondary | ICD-10-CM | POA: Insufficient documentation

## 2018-11-30 DIAGNOSIS — Z87891 Personal history of nicotine dependence: Secondary | ICD-10-CM | POA: Diagnosis not present

## 2018-11-30 DIAGNOSIS — R531 Weakness: Secondary | ICD-10-CM | POA: Diagnosis not present

## 2018-11-30 DIAGNOSIS — Z79899 Other long term (current) drug therapy: Secondary | ICD-10-CM | POA: Diagnosis not present

## 2018-11-30 DIAGNOSIS — Z85038 Personal history of other malignant neoplasm of large intestine: Secondary | ICD-10-CM | POA: Diagnosis not present

## 2018-11-30 DIAGNOSIS — R319 Hematuria, unspecified: Secondary | ICD-10-CM | POA: Diagnosis not present

## 2018-11-30 DIAGNOSIS — I10 Essential (primary) hypertension: Secondary | ICD-10-CM | POA: Insufficient documentation

## 2018-11-30 DIAGNOSIS — E119 Type 2 diabetes mellitus without complications: Secondary | ICD-10-CM | POA: Diagnosis not present

## 2018-11-30 DIAGNOSIS — R300 Dysuria: Secondary | ICD-10-CM | POA: Diagnosis present

## 2018-11-30 LAB — URINALYSIS, MICROSCOPIC (REFLEX)
BACTERIA UA: NONE SEEN
RBC / HPF: >50 RBC/hpf (ref 0–5)

## 2018-11-30 LAB — BASIC METABOLIC PANEL
Anion gap: 9 (ref 5–15)
BUN: 49 mg/dL — ABNORMAL HIGH (ref 8–23)
CALCIUM: 9.5 mg/dL (ref 8.9–10.3)
CO2: 23 mmol/L (ref 22–32)
Chloride: 109 mmol/L (ref 98–111)
Creatinine, Ser: 1.88 mg/dL — ABNORMAL HIGH (ref 0.61–1.24)
GFR calc Af Amer: 35 mL/min — ABNORMAL LOW (ref >60)
GFR, EST NON AFRICAN AMERICAN: 30 mL/min — AB (ref >60)
Glucose, Bld: 83 mg/dL (ref 70–99)
Potassium: 4 mmol/L (ref 3.5–5.1)
Sodium: 141 mmol/L (ref 135–145)

## 2018-11-30 LAB — CBC WITH DIFFERENTIAL/PLATELET
ABS IMMATURE GRANULOCYTES: 0.02 10*3/uL (ref 0.00–0.07)
BASOS PCT: 1 %
Basophils Absolute: 0 10*3/uL (ref 0.0–0.1)
EOS ABS: 0.3 10*3/uL (ref 0.0–0.5)
EOS PCT: 5 %
HEMATOCRIT: 36.8 % — AB (ref 39.0–52.0)
Hemoglobin: 11.6 g/dL — ABNORMAL LOW (ref 13.0–17.0)
IMMATURE GRANULOCYTES: 0 %
LYMPHS ABS: 1.8 10*3/uL (ref 0.7–4.0)
LYMPHS PCT: 36 %
MCH: 27 pg (ref 26.0–34.0)
MCHC: 31.5 g/dL (ref 30.0–36.0)
MCV: 85.8 fL (ref 80.0–100.0)
MONO ABS: 0.4 10*3/uL (ref 0.1–1.0)
Monocytes Relative: 9 %
Neutro Abs: 2.4 10*3/uL (ref 1.7–7.7)
Neutrophils Relative %: 49 %
Platelets: 177 10*3/uL (ref 150–400)
RBC: 4.29 MIL/uL (ref 4.22–5.81)
RDW: 15.3 % (ref 11.5–15.5)
WBC: 5 10*3/uL (ref 4.0–10.5)
nRBC: 0 % (ref 0.0–0.2)

## 2018-11-30 LAB — URINALYSIS, ROUTINE W REFLEX MICROSCOPIC
Bilirubin Urine: NEGATIVE
Glucose, UA: NEGATIVE mg/dL
Ketones, ur: NEGATIVE mg/dL
Nitrite: POSITIVE — AB
PH: 6.5 (ref 5.0–8.0)
Protein, ur: >300 mg/dL — AB
SPECIFIC GRAVITY, URINE: 1.015 (ref 1.005–1.030)

## 2018-11-30 MED ORDER — CEPHALEXIN 500 MG PO CAPS: 500.0000 mg | ORAL_CAPSULE | Freq: Three times a day (TID) | ORAL | 0 refills | Status: DC

## 2018-11-30 MED ORDER — SODIUM CHLORIDE 0.9 % IV SOLN
1.0000 g | Freq: Once | INTRAVENOUS | Status: AC
  Administered 2018-11-30: 1 g via INTRAVENOUS
  Filled 2018-11-30: qty 10

## 2018-11-30 NOTE — ED Provider Notes (Signed)
TIME SEEN: 2:45 AM  CHIEF COMPLAINT: Dysuria, hematuria  HPI: Patient is a 85 year old male with history of hypertension, diabetes, prostate cancer who presents to the emergency department with several days of hematuria and dysuria.  Has not been passing clots.  Feels he can empty his bladder normally.  No fevers, vomiting or diarrhea.  Had similar symptoms in January which spontaneously resolved.  States he never followed up with his urologist.  No abdominal pain or flank pain.  No history of kidney stones.  No known history of bladder cancer, renal cell carcinoma.  No weight loss, night sweats.  ROS: See HPI Constitutional: no fever  Eyes: no drainage  ENT: no runny nose   Cardiovascular:  no chest pain  Resp: no SOB  GI: no vomiting GU: no dysuria Integumentary: no rash  Allergy: no hives  Musculoskeletal: no leg swelling  Neurological: no slurred speech ROS otherwise negative  PAST MEDICAL HISTORY/PAST SURGICAL HISTORY:  Past Medical History:  Diagnosis Date  . Adenomatous polyps 09/09/2008  . Arthritis   . Carpal tunnel syndrome, bilateral 11/21/2016  . Colon cancer ([place]) dx'd 2008   surg only  . Diabetes mellitus without complication ([place])   . Falls frequently   . Hypertension   . Prostate cancer ([place]) dx'd 1993   surg only    MEDICATIONS:  Prior to Admission medications   Medication Sig Start Date End Date Taking? Authorizing Provider  amLODipine (NORVASC) 10 MG tablet Take 10 mg by mouth daily.    [provider]  aspirin EC 81 MG tablet Take 1 tablet (81 mg total) by mouth daily. 05/22/14   Reyne Dumas, MD  citalopram (CELEXA) 10 MG tablet Take 10 mg by mouth daily. 11/24/17   [provider]  furosemide (LASIX) 40 MG tablet Take 1 tablet (40 mg total) by mouth daily. (take for 3 days then stop and follow up with PCP to determine whether to continue) 12/05/17 01/04/18  Elodia Florence., MD  potassium chloride (KLOR-CON) 20 MEQ packet Take 20 mEq  by mouth daily. (take for 3 days, then stop and follow up labs with PCP to determine whether to continue) 12/05/17 01/04/18  Elodia Florence., MD  XTANDI 40 MG capsule Take 40 mg by mouth daily. 10/01/17   [provider]    ALLERGIES:  No Known Allergies  SOCIAL HISTORY:  Social History   Tobacco Use  . Smoking status: Former Smoker    Years: 1.00    Types: Cigarettes    Last attempt to quit: 06/10/1983    Years since quitting: 35.4  . Smokeless tobacco: Never Used  Substance Use Topics  . Alcohol use: Yes    Comment: occasional wine    FAMILY HISTORY: Family History  Problem Relation Age of Onset  . Diabetes Other     EXAM: BP (!) 160/75 (BP Location: Right Arm)   Pulse 61   Temp (!) 97.5 F (36.4 C) (Oral)   Resp 17   SpO2 100%  CONSTITUTIONAL: Alert and oriented and responds appropriately to questions. Well-appearing; well-nourished, elderly HEAD: Normocephalic EYES: Conjunctivae clear, pupils appear equal, EOMI ENT: normal nose; moist mucous membranes NECK: Supple, no meningismus, no nuchal rigidity, no LAD  CARD: RRR; S1 and S2 appreciated; no murmurs, no clicks, no rubs, no gallops RESP: Normal chest excursion without splinting or tachypnea; breath sounds clear and equal bilaterally; no wheezes, no rhonchi, no rales, no hypoxia or respiratory distress, speaking full sentences ABD/GI: Normal bowel  sounds; non-distended; soft, non-tender, no rebound, no guarding, no peritoneal signs, no hepatosplenomegaly BACK:  The back appears normal and is non-tender to palpation, there is no CVA tenderness EXT: Normal ROM in all joints; non-tender to palpation; no edema; normal capillary refill; no cyanosis, no calf tenderness or swelling    SKIN: Normal color for age and race; warm; no rash NEURO: Moves all extremities equally PSYCH: The patient's mood and manner are appropriate. Grooming and personal hygiene are appropriate.  MEDICAL DECISION MAKING: Patient  here with hematuria.  Does describe some dysuria.  Could be from UTI.  Emptying bladder without difficulty.  Not passing clots.  Not on anticoagulation.  No abdominal pain or flank pain.  Doubt kidney stone, kidney infection.  Have recommended close follow-up with urology as an outpatient.  Labs and urine pending.  We will send urine culture.  ED PROGRESS: Labs show large amount of blood and trace leukocyte esterase, positive nitrites.  Will treat for possible infection given complaints of dysuria.  Culture pending.  Given IV ceftriaxone.  Labs show chronic kidney disease which is stable.  Hemoglobin 11.6.  No thrombocytopenia.  He is well-appearing today without fever, signs of sepsis.  Will discharge home with close outpatient follow-up with [facility].  Discussed return precautions with patient and family and they are comfortable with this plan.    At this time, I do not feel there is any life-threatening condition present. I have reviewed and discussed all results (EKG, imaging, lab, urine as appropriate) and exam findings with patient/family. I have reviewed nursing notes and appropriate previous records.  I feel the patient is safe to be discharged home without further emergent workup and can continue workup as an outpatient as needed. Discussed usual and customary return precautions. Patient/family verbalize understanding and are comfortable with this plan.  Outpatient follow-up has been provided as needed. All questions have been answered.     Alissah Redmon, Delice Bison, DO 11/30/18 [phone]

## 2018-11-30 NOTE — ED Notes (Signed)
Bed: WA20 Expected date:  Expected time:  Means of arrival:  Comments: EMS 85 yo hematuria

## 2018-11-30 NOTE — ED Triage Notes (Signed)
Per EMS, pt. From home with complaint of hematuria x 2 days. Pt. Has hx of hematuria and was seen here 2 weeks ago. Pt. Denied fever, pain nor N/V. Alert  and oriented x 4. Pt. alsoo complaint that his bil legs are getting weaker x 2 days  Now. Pt.reported to be ambulatory at home upon EMS arrival , uses cane.

## 2018-12-01 LAB — URINE CULTURE

## 2018-12-03 DIAGNOSIS — R42 Dizziness and giddiness: Secondary | ICD-10-CM | POA: Diagnosis not present

## 2018-12-03 DIAGNOSIS — I130 Hypertensive heart and chronic kidney disease with heart failure and stage 1 through stage 4 chronic kidney disease, or unspecified chronic kidney disease: Secondary | ICD-10-CM | POA: Diagnosis not present

## 2018-12-03 DIAGNOSIS — Z9181 History of falling: Secondary | ICD-10-CM | POA: Diagnosis not present

## 2018-12-03 DIAGNOSIS — I5033 Acute on chronic diastolic (congestive) heart failure: Secondary | ICD-10-CM | POA: Diagnosis not present

## 2018-12-03 DIAGNOSIS — N183 Chronic kidney disease, stage 3 (moderate): Secondary | ICD-10-CM | POA: Diagnosis not present

## 2018-12-03 DIAGNOSIS — Z85038 Personal history of other malignant neoplasm of large intestine: Secondary | ICD-10-CM | POA: Diagnosis not present

## 2018-12-03 DIAGNOSIS — Z87891 Personal history of nicotine dependence: Secondary | ICD-10-CM | POA: Diagnosis not present

## 2018-12-03 DIAGNOSIS — G5603 Carpal tunnel syndrome, bilateral upper limbs: Secondary | ICD-10-CM | POA: Diagnosis not present

## 2018-12-03 DIAGNOSIS — E1122 Type 2 diabetes mellitus with diabetic chronic kidney disease: Secondary | ICD-10-CM | POA: Diagnosis not present

## 2018-12-09 DIAGNOSIS — Z9181 History of falling: Secondary | ICD-10-CM | POA: Diagnosis not present

## 2018-12-09 DIAGNOSIS — N183 Chronic kidney disease, stage 3 (moderate): Secondary | ICD-10-CM | POA: Diagnosis not present

## 2018-12-09 DIAGNOSIS — E1122 Type 2 diabetes mellitus with diabetic chronic kidney disease: Secondary | ICD-10-CM | POA: Diagnosis not present

## 2018-12-09 DIAGNOSIS — G5603 Carpal tunnel syndrome, bilateral upper limbs: Secondary | ICD-10-CM | POA: Diagnosis not present

## 2018-12-09 DIAGNOSIS — R42 Dizziness and giddiness: Secondary | ICD-10-CM | POA: Diagnosis not present

## 2018-12-09 DIAGNOSIS — I130 Hypertensive heart and chronic kidney disease with heart failure and stage 1 through stage 4 chronic kidney disease, or unspecified chronic kidney disease: Secondary | ICD-10-CM | POA: Diagnosis not present

## 2018-12-09 DIAGNOSIS — Z87891 Personal history of nicotine dependence: Secondary | ICD-10-CM | POA: Diagnosis not present

## 2018-12-09 DIAGNOSIS — Z85038 Personal history of other malignant neoplasm of large intestine: Secondary | ICD-10-CM | POA: Diagnosis not present

## 2018-12-09 DIAGNOSIS — I5033 Acute on chronic diastolic (congestive) heart failure: Secondary | ICD-10-CM | POA: Diagnosis not present

## 2018-12-11 DIAGNOSIS — Z85038 Personal history of other malignant neoplasm of large intestine: Secondary | ICD-10-CM | POA: Diagnosis not present

## 2018-12-11 DIAGNOSIS — N183 Chronic kidney disease, stage 3 (moderate): Secondary | ICD-10-CM | POA: Diagnosis not present

## 2018-12-11 DIAGNOSIS — E1122 Type 2 diabetes mellitus with diabetic chronic kidney disease: Secondary | ICD-10-CM | POA: Diagnosis not present

## 2018-12-11 DIAGNOSIS — R42 Dizziness and giddiness: Secondary | ICD-10-CM | POA: Diagnosis not present

## 2018-12-11 DIAGNOSIS — G5603 Carpal tunnel syndrome, bilateral upper limbs: Secondary | ICD-10-CM | POA: Diagnosis not present

## 2018-12-11 DIAGNOSIS — I5033 Acute on chronic diastolic (congestive) heart failure: Secondary | ICD-10-CM | POA: Diagnosis not present

## 2018-12-11 DIAGNOSIS — Z9181 History of falling: Secondary | ICD-10-CM | POA: Diagnosis not present

## 2018-12-11 DIAGNOSIS — Z87891 Personal history of nicotine dependence: Secondary | ICD-10-CM | POA: Diagnosis not present

## 2018-12-11 DIAGNOSIS — I130 Hypertensive heart and chronic kidney disease with heart failure and stage 1 through stage 4 chronic kidney disease, or unspecified chronic kidney disease: Secondary | ICD-10-CM | POA: Diagnosis not present

## 2018-12-16 ENCOUNTER — Ambulatory Visit: Payer: Self-pay | Admitting: Oncology

## 2018-12-16 ENCOUNTER — Other Ambulatory Visit: Payer: Self-pay

## 2018-12-16 DIAGNOSIS — Z9181 History of falling: Secondary | ICD-10-CM | POA: Diagnosis not present

## 2018-12-16 DIAGNOSIS — Z87891 Personal history of nicotine dependence: Secondary | ICD-10-CM | POA: Diagnosis not present

## 2018-12-16 DIAGNOSIS — R42 Dizziness and giddiness: Secondary | ICD-10-CM | POA: Diagnosis not present

## 2018-12-16 DIAGNOSIS — N183 Chronic kidney disease, stage 3 (moderate): Secondary | ICD-10-CM | POA: Diagnosis not present

## 2018-12-16 DIAGNOSIS — E1122 Type 2 diabetes mellitus with diabetic chronic kidney disease: Secondary | ICD-10-CM | POA: Diagnosis not present

## 2018-12-16 DIAGNOSIS — Z85038 Personal history of other malignant neoplasm of large intestine: Secondary | ICD-10-CM | POA: Diagnosis not present

## 2018-12-16 DIAGNOSIS — I5033 Acute on chronic diastolic (congestive) heart failure: Secondary | ICD-10-CM | POA: Diagnosis not present

## 2018-12-16 DIAGNOSIS — I130 Hypertensive heart and chronic kidney disease with heart failure and stage 1 through stage 4 chronic kidney disease, or unspecified chronic kidney disease: Secondary | ICD-10-CM | POA: Diagnosis not present

## 2018-12-16 DIAGNOSIS — G5603 Carpal tunnel syndrome, bilateral upper limbs: Secondary | ICD-10-CM | POA: Diagnosis not present

## 2018-12-18 DIAGNOSIS — R42 Dizziness and giddiness: Secondary | ICD-10-CM | POA: Diagnosis not present

## 2018-12-18 DIAGNOSIS — Z85038 Personal history of other malignant neoplasm of large intestine: Secondary | ICD-10-CM | POA: Diagnosis not present

## 2018-12-18 DIAGNOSIS — Z9181 History of falling: Secondary | ICD-10-CM | POA: Diagnosis not present

## 2018-12-18 DIAGNOSIS — I5033 Acute on chronic diastolic (congestive) heart failure: Secondary | ICD-10-CM | POA: Diagnosis not present

## 2018-12-18 DIAGNOSIS — Z87891 Personal history of nicotine dependence: Secondary | ICD-10-CM | POA: Diagnosis not present

## 2018-12-18 DIAGNOSIS — G5603 Carpal tunnel syndrome, bilateral upper limbs: Secondary | ICD-10-CM | POA: Diagnosis not present

## 2018-12-18 DIAGNOSIS — E1122 Type 2 diabetes mellitus with diabetic chronic kidney disease: Secondary | ICD-10-CM | POA: Diagnosis not present

## 2018-12-18 DIAGNOSIS — I130 Hypertensive heart and chronic kidney disease with heart failure and stage 1 through stage 4 chronic kidney disease, or unspecified chronic kidney disease: Secondary | ICD-10-CM | POA: Diagnosis not present

## 2018-12-18 DIAGNOSIS — N183 Chronic kidney disease, stage 3 (moderate): Secondary | ICD-10-CM | POA: Diagnosis not present

## 2018-12-20 ENCOUNTER — Emergency Department (HOSPITAL_COMMUNITY): Payer: Medicare Other

## 2018-12-20 ENCOUNTER — Observation Stay (HOSPITAL_COMMUNITY)
Admission: EM | Admit: 2018-12-20 | Discharge: 2018-12-21 | Disposition: A | Discharge status: Home / Self Care | Payer: Medicare Other | Attending: Internal Medicine | Admitting: Internal Medicine

## 2018-12-20 ENCOUNTER — Other Ambulatory Visit: Payer: Self-pay

## 2018-12-20 ENCOUNTER — Encounter (HOSPITAL_COMMUNITY): Payer: Self-pay | Admitting: Emergency Medicine

## 2018-12-20 DIAGNOSIS — I10 Essential (primary) hypertension: Secondary | ICD-10-CM

## 2018-12-20 DIAGNOSIS — I5031 Acute diastolic (congestive) heart failure: Secondary | ICD-10-CM | POA: Diagnosis not present

## 2018-12-20 DIAGNOSIS — Z8546 Personal history of malignant neoplasm of prostate: Secondary | ICD-10-CM | POA: Insufficient documentation

## 2018-12-20 DIAGNOSIS — N183 Chronic kidney disease, stage 3 (moderate): Secondary | ICD-10-CM | POA: Diagnosis not present

## 2018-12-20 DIAGNOSIS — Z87891 Personal history of nicotine dependence: Secondary | ICD-10-CM | POA: Diagnosis not present

## 2018-12-20 DIAGNOSIS — Z7982 Long term (current) use of aspirin: Secondary | ICD-10-CM | POA: Diagnosis not present

## 2018-12-20 DIAGNOSIS — I130 Hypertensive heart and chronic kidney disease with heart failure and stage 1 through stage 4 chronic kidney disease, or unspecified chronic kidney disease: Principal | ICD-10-CM | POA: Insufficient documentation

## 2018-12-20 DIAGNOSIS — E1122 Type 2 diabetes mellitus with diabetic chronic kidney disease: Secondary | ICD-10-CM | POA: Diagnosis not present

## 2018-12-20 DIAGNOSIS — Z79899 Other long term (current) drug therapy: Secondary | ICD-10-CM | POA: Insufficient documentation

## 2018-12-20 DIAGNOSIS — I5033 Acute on chronic diastolic (congestive) heart failure: Secondary | ICD-10-CM | POA: Insufficient documentation

## 2018-12-20 DIAGNOSIS — Z23 Encounter for immunization: Secondary | ICD-10-CM | POA: Insufficient documentation

## 2018-12-20 DIAGNOSIS — J80 Acute respiratory distress syndrome: Secondary | ICD-10-CM | POA: Diagnosis not present

## 2018-12-20 DIAGNOSIS — R296 Repeated falls: Secondary | ICD-10-CM | POA: Diagnosis not present

## 2018-12-20 DIAGNOSIS — I509 Heart failure, unspecified: Secondary | ICD-10-CM

## 2018-12-20 DIAGNOSIS — R0602 Shortness of breath: Secondary | ICD-10-CM | POA: Diagnosis not present

## 2018-12-20 DIAGNOSIS — I110 Hypertensive heart disease with heart failure: Secondary | ICD-10-CM | POA: Diagnosis not present

## 2018-12-20 DIAGNOSIS — M6281 Muscle weakness (generalized): Secondary | ICD-10-CM | POA: Insufficient documentation

## 2018-12-20 DIAGNOSIS — Z833 Family history of diabetes mellitus: Secondary | ICD-10-CM | POA: Diagnosis not present

## 2018-12-20 DIAGNOSIS — I447 Left bundle-branch block, unspecified: Secondary | ICD-10-CM | POA: Diagnosis not present

## 2018-12-20 DIAGNOSIS — E785 Hyperlipidemia, unspecified: Secondary | ICD-10-CM | POA: Insufficient documentation

## 2018-12-20 DIAGNOSIS — Z85038 Personal history of other malignant neoplasm of large intestine: Secondary | ICD-10-CM | POA: Insufficient documentation

## 2018-12-20 DIAGNOSIS — I5030 Unspecified diastolic (congestive) heart failure: Secondary | ICD-10-CM | POA: Diagnosis present

## 2018-12-20 DIAGNOSIS — R2681 Unsteadiness on feet: Secondary | ICD-10-CM | POA: Diagnosis not present

## 2018-12-20 LAB — CBC WITH DIFFERENTIAL/PLATELET
Abs Immature Granulocytes: 0.01 10*3/uL (ref 0.00–0.07)
BASOS ABS: 0 10*3/uL (ref 0.0–0.1)
Basophils Relative: 0 %
Eosinophils Absolute: 0.2 10*3/uL (ref 0.0–0.5)
Eosinophils Relative: 3 %
HCT: 33 % — ABNORMAL LOW (ref 39.0–52.0)
Hemoglobin: 10.3 g/dL — ABNORMAL LOW (ref 13.0–17.0)
Immature Granulocytes: 0 %
LYMPHS PCT: 24 %
Lymphs Abs: 1.4 10*3/uL (ref 0.7–4.0)
MCH: 27.2 pg (ref 26.0–34.0)
MCHC: 31.2 g/dL (ref 30.0–36.0)
MCV: 87.1 fL (ref 80.0–100.0)
Monocytes Absolute: 0.4 10*3/uL (ref 0.1–1.0)
Monocytes Relative: 7 %
NEUTROS ABS: 3.8 10*3/uL (ref 1.7–7.7)
Neutrophils Relative %: 66 %
Platelets: 195 10*3/uL (ref 150–400)
RBC: 3.79 MIL/uL — ABNORMAL LOW (ref 4.22–5.81)
RDW: 15.5 % (ref 11.5–15.5)
WBC: 5.7 10*3/uL (ref 4.0–10.5)
nRBC: 0 % (ref 0.0–0.2)

## 2018-12-20 LAB — COMPREHENSIVE METABOLIC PANEL
ALT: 12 U/L (ref 0–44)
AST: 24 U/L (ref 15–41)
Albumin: 3.4 g/dL — ABNORMAL LOW (ref 3.5–5.0)
Alkaline Phosphatase: 94 U/L (ref 38–126)
Anion gap: 9 (ref 5–15)
BUN: 28 mg/dL — ABNORMAL HIGH (ref 8–23)
CO2: 23 mmol/L (ref 22–32)
Calcium: 8.9 mg/dL (ref 8.9–10.3)
Chloride: 110 mmol/L (ref 98–111)
Creatinine, Ser: 2.04 mg/dL — ABNORMAL HIGH (ref 0.61–1.24)
GFR calc non Af Amer: 27 mL/min — ABNORMAL LOW (ref >60)
GFR, EST AFRICAN AMERICAN: 32 mL/min — AB (ref >60)
Glucose, Bld: 124 mg/dL — ABNORMAL HIGH (ref 70–99)
Potassium: 3.2 mmol/L — ABNORMAL LOW (ref 3.5–5.1)
Sodium: 142 mmol/L (ref 135–145)
Total Bilirubin: 0.8 mg/dL (ref 0.3–1.2)
Total Protein: 6.9 g/dL (ref 6.5–8.1)

## 2018-12-20 LAB — I-STAT TROPONIN, ED: Troponin i, poc: 0.03 ng/mL (ref 0.00–0.08)

## 2018-12-20 LAB — CREATININE, SERUM
Creatinine, Ser: 2.03 mg/dL — ABNORMAL HIGH (ref 0.61–1.24)
GFR calc Af Amer: 32 mL/min — ABNORMAL LOW (ref >60)
GFR calc non Af Amer: 28 mL/min — ABNORMAL LOW (ref >60)

## 2018-12-20 LAB — BRAIN NATRIURETIC PEPTIDE: B Natriuretic Peptide: 370.1 pg/mL — ABNORMAL HIGH (ref 0.0–100.0)

## 2018-12-20 LAB — TROPONIN I: Troponin I: <0.03 ng/mL (ref <0.03)

## 2018-12-20 MED ORDER — ACETAMINOPHEN 325 MG PO TABS: 650.0000 mg | ORAL_TABLET | Freq: Four times a day (QID) | ORAL | Status: DC | PRN

## 2018-12-20 MED ORDER — GUAIFENESIN-DM 100-10 MG/5ML PO SYRP
5.0000 mL | ORAL_SOLUTION | ORAL | Status: DC | PRN
  Administered 2018-12-20 (×2): 5 mL via ORAL
  Filled 2018-12-20 (×2): qty 10

## 2018-12-20 MED ORDER — AMLODIPINE BESYLATE 5 MG PO TABS
10.0000 mg | ORAL_TABLET | Freq: Every day | ORAL | Status: DC
  Administered 2018-12-20 – 2018-12-21 (×2): 10 mg via ORAL
  Filled 2018-12-20 (×2): qty 2

## 2018-12-20 MED ORDER — INFLUENZA VAC SPLIT HIGH-DOSE 0.5 ML IM SUSY
0.5000 mL | PREFILLED_SYRINGE | INTRAMUSCULAR | Status: AC
  Administered 2018-12-21: 0.5 mL via INTRAMUSCULAR
  Filled 2018-12-20: qty 0.5

## 2018-12-20 MED ORDER — ENSURE ENLIVE PO LIQD
237.0000 mL | Freq: Two times a day (BID) | ORAL | Status: DC
  Administered 2018-12-20 – 2018-12-21 (×3): 237 mL via ORAL

## 2018-12-20 MED ORDER — ACETAMINOPHEN 650 MG RE SUPP: 650.0000 mg | Freq: Four times a day (QID) | RECTAL | Status: DC | PRN

## 2018-12-20 MED ORDER — ASPIRIN EC 81 MG PO TBEC
81.0000 mg | DELAYED_RELEASE_TABLET | Freq: Every day | ORAL | Status: DC
  Administered 2018-12-20 – 2018-12-21 (×2): 81 mg via ORAL
  Filled 2018-12-20 (×2): qty 1

## 2018-12-20 MED ORDER — FUROSEMIDE 10 MG/ML IJ SOLN
40.0000 mg | Freq: Once | INTRAMUSCULAR | Status: AC
  Administered 2018-12-20: 40 mg via INTRAVENOUS
  Filled 2018-12-20: qty 4

## 2018-12-20 MED ORDER — FUROSEMIDE 10 MG/ML IJ SOLN
40.0000 mg | Freq: Every day | INTRAMUSCULAR | Status: DC
  Administered 2018-12-20: 40 mg via INTRAVENOUS
  Filled 2018-12-20: qty 4

## 2018-12-20 MED ORDER — ENOXAPARIN SODIUM 30 MG/0.3ML ~~LOC~~ SOLN
30.0000 mg | Freq: Every day | SUBCUTANEOUS | Status: DC
  Administered 2018-12-20 – 2018-12-21 (×2): 30 mg via SUBCUTANEOUS
  Filled 2018-12-20 (×2): qty 0.3

## 2018-12-20 MED ORDER — ENZALUTAMIDE 40 MG PO CAPS: 40.0000 mg | ORAL_CAPSULE | Freq: Every day | ORAL | Status: DC

## 2018-12-20 MED ORDER — CITALOPRAM HYDROBROMIDE 10 MG PO TABS
10.0000 mg | ORAL_TABLET | Freq: Every day | ORAL | Status: DC
  Administered 2018-12-20 – 2018-12-21 (×2): 10 mg via ORAL
  Filled 2018-12-20 (×3): qty 1

## 2018-12-20 MED ORDER — SACUBITRIL-VALSARTAN 24-26 MG PO TABS
1.0000 | ORAL_TABLET | Freq: Two times a day (BID) | ORAL | Status: DC
  Administered 2018-12-20 – 2018-12-21 (×3): 1 via ORAL
  Filled 2018-12-20 (×5): qty 1

## 2018-12-20 NOTE — ED Provider Notes (Signed)
[facility] DEPT Provider Note  CSN: [id number] Arrival date & time: 12/20/18 0345  Chief Complaint(s) Respiratory Distress  HPI [name] is a 85 y.o. male with a past medical history listed below including hypertension, diabetes, diastolic heart failure with last EF 60 to 65% 1 year ago, prostate cancer, colon cancer who presents to the emergency department with 1 to 2 days of coughing and gradually worsening shortness of breath.  Does not endorse any alleviating or aggravating factors.  Denies any chest pain.  No nausea or vomiting.  No abdominal pain.  Denies any known fevers.  No known sick contacts.  EMS was called out and noted patient was hypertensive with systolics in the 009F.  Patient was tachypneic but satting 95% on room air.  Noted that he had diffuse expiratory wheezes and started on a DuoNeb.  HPI  Past Medical History Past Medical History:  Diagnosis Date  . Adenomatous polyps 09/09/2008  . Arthritis   . Carpal tunnel syndrome, bilateral 11/21/2016  . Colon cancer ([place]) dx'd 2008   surg only  . Diabetes mellitus without complication ([place])   . Falls frequently   . Hypertension   . Prostate cancer ([place]) dx'd 1993   surg only   Patient Active Problem List   Diagnosis Date Noted  . Acute CHF (congestive heart failure) ([place]) 12/02/2017  . Hypothermia 12/02/2017  . Dyspnea 12/02/2017  . CHF (congestive heart failure) ([place]) 12/02/2017  . Carpal tunnel syndrome, bilateral 11/21/2016  . Falls frequently   . Vertigo 05/20/2014  . PROSTATE CANCER 08/19/2008  . DM 08/19/2008  . HYPERLIPIDEMIA 08/19/2008  . Essential hypertension 08/19/2008  . Personal history of colon cancer, stage II 08/19/2008   Home Medication(s) Prior to Admission medications   Medication Sig Start Date End Date Taking? Authorizing Provider  amLODipine (NORVASC) 10 MG tablet Take 10 mg by mouth daily.    [provider]  aspirin EC 81 MG tablet Take 1  tablet (81 mg total) by mouth daily. 05/22/14   Reyne Dumas, MD  cephALEXin (KEFLEX) 500 MG capsule Take 1 capsule (500 mg total) by mouth 3 (three) times daily. 11/30/18   Ward, Delice Bison, DO  citalopram (CELEXA) 10 MG tablet Take 10 mg by mouth daily. 11/24/17   [provider]  furosemide (LASIX) 40 MG tablet Take 1 tablet (40 mg total) by mouth daily. (take for 3 days then stop and follow up with PCP to determine whether to continue) 12/05/17 01/04/18  Elodia Florence., MD  potassium chloride (KLOR-CON) 20 MEQ packet Take 20 mEq by mouth daily. (take for 3 days, then stop and follow up labs with PCP to determine whether to continue) 12/05/17 01/04/18  Elodia Florence., MD  XTANDI 40 MG capsule Take 40 mg by mouth daily. 10/01/17   [provider]  Past Surgical History Past Surgical History:  Procedure Laterality Date  . CATARACT EXTRACTION W/PHACO Left 06/10/2013   Procedure: CATARACT EXTRACTION PHACO AND INTRAOCULAR LENS PLACEMENT (IOC);  Surgeon: Adonis Brook, MD;  Location: [facility];  Service: Ophthalmology;  Laterality: Left;  . COLONOSCOPY    . ESOPHAGOGASTRODUODENOSCOPY    . EYE SURGERY     cataract surgery, right eye  . laparoscopic assisted right hemicolectomy  09/21/07   Family History Family History  Problem Relation Age of Onset  . Diabetes Other     Social History Social History   Tobacco Use  . Smoking status: Former Smoker    Years: 1.00    Types: Cigarettes    Last attempt to quit: 06/10/1983    Years since quitting: 35.5  . Smokeless tobacco: Never Used  Substance Use Topics  . Alcohol use: Yes    Comment: occasional wine  . Drug use: No   Allergies Patient has no known allergies.  Review of Systems Review of Systems All other systems are reviewed and are negative for acute change except as noted in the  HPI  Physical Exam Vital Signs  I have reviewed the triage vital signs BP (!) 183/62 (BP Location: Right Arm)   Pulse 67   Resp (!) 22   SpO2 93%    Physical Exam Vitals signs reviewed.  Constitutional:      General: He is not in acute distress.    Appearance: He is well-developed. He is not diaphoretic.  HENT:     Head: Normocephalic and atraumatic.     Nose: Nose normal.  Eyes:     General: No scleral icterus.       Right eye: No discharge.        Left eye: No discharge.     Conjunctiva/sclera: Conjunctivae normal.     Pupils: Pupils are equal, round, and reactive to light.  Neck:     Musculoskeletal: Normal range of motion and neck supple.  Cardiovascular:     Rate and Rhythm: Normal rate and regular rhythm.     Heart sounds: No murmur. No friction rub. No gallop.   Pulmonary:     Effort: Tachypnea present.     Breath sounds: Decreased air movement present. No stridor. Wheezing (diffuse exp wheezing) present. No rales.  Abdominal:     General: There is no distension.     Palpations: Abdomen is soft.     Tenderness: There is no abdominal tenderness.  Musculoskeletal:        General: No tenderness.     Comments: 2+ bilateral lower extremity edema  Skin:    General: Skin is warm and dry.     Findings: No erythema or rash.  Neurological:     Mental Status: He is alert and oriented to person, place, and time.     ED Results and Treatments Labs (all labs ordered are listed, but only abnormal results are displayed) Labs Reviewed  CBC WITH DIFFERENTIAL/PLATELET - Abnormal; Notable for the following components:      Result Value   RBC 3.79 (*)    Hemoglobin 10.3 (*)    HCT 33.0 (*)    All other components within normal limits  BRAIN NATRIURETIC PEPTIDE - Abnormal; Notable for the following components:   B Natriuretic Peptide 370.1 (*)    All other components within normal limits  COMPREHENSIVE METABOLIC PANEL - Abnormal; Notable for the following components:    Potassium 3.2 (*)    Glucose, Bld 124 (*)  BUN 28 (*)    Creatinine, Ser 2.04 (*)    Albumin 3.4 (*)    GFR calc non Af Amer 27 (*)    GFR calc Af Amer 32 (*)    All other components within normal limits  TROPONIN I  I-STAT TROPONIN, ED                                                                                                                         EKG  EKG Interpretation  Date/Time:  Saturday December 20 2018 04:05:06 EST Ventricular Rate:  71 PR Interval:    QRS Duration: 145 QT Interval:  442 QTC Calculation: 481 R Axis:   48 Text Interpretation:  Sinus rhythm Left bundle branch block No significant change since last tracing Confirmed by Addison Lank [phone]) on 12/20/2018 4:15:41 AM      Radiology Dg Chest 2 View  Result Date: 12/20/2018 CLINICAL DATA:  Initial evaluation for acute shortness of breath. EXAM: CHEST - 2 VIEW COMPARISON:  Prior radiograph from 10/23/2017 FINDINGS: Mild cardiomegaly, stable. Mediastinal silhouette within normal limits. Lungs are hypoinflated. Perihilar vascular congestion without frank pulmonary edema. Small right pleural effusion. Superimposed bibasilar atelectatic changes. No consolidative opacity. No pneumothorax. No acute osseous abnormality. IMPRESSION: 1. Stable cardiomegaly with mild perihilar vascular congestion without overt pulmonary edema. 2. Small right pleural effusion. 3. Low lung volumes with superimposed bibasilar atelectatic changes. Electronically Signed   By: Jeannine Boga M.D.   On: 12/20/2018 04:53   Pertinent labs & imaging results that were available during my care of the patient were reviewed by me and considered in my medical decision making (see chart for details).  Medications Ordered in ED Medications  furosemide (LASIX) injection 40 mg (40 mg Intravenous Given 12/20/18 0654)                                                                                                                                      Procedures Procedures  (including critical care time)  Medical Decision Making / ED Course I have reviewed the nursing notes for this encounter and the patient's prior records (if available in EHR or on provided paperwork).    Patient presents with several days of increased shortness of breath and fatigue.  Work-up suspicious for CHF exacerbation.  Attempted to diurese in the emergency department however patient became extremely tachypneic upon standing.  Feel  he will need inpatient admission for IV diuresing.  Final Clinical Impression(s) / ED Diagnoses Final diagnoses:  SOB (shortness of breath)  Acute on chronic diastolic congestive heart failure ([place])      This chart was dictated using voice recognition software.  Despite best efforts to proofread,  errors can occur which can change the documentation meaning.   Fatima Blank, MD 12/20/18 [phone]

## 2018-12-20 NOTE — ED Notes (Signed)
ED TO INPATIENT HANDOFF REPORT  Name/Age/Gender [name] 85 y.o. male  Code Status    Code Status Orders  (From admission, onward)         Start     Ordered   12/20/18 0847  Full code  Continuous     12/20/18 0847        Code Status History    Date Active Date Inactive Code Status Order ID Comments User Context   12/02/2017 2324 12/05/2017 1942 Full Code [id number]  Phillips Grout, MD ED   05/20/2014 1744 05/22/2014 1649 Full Code [id number]  Reyne Dumas, MD Inpatient      Home/SNF/Other Home  Chief Complaint RD  Level of Care/Admitting Diagnosis ED Disposition    ED Disposition Condition [place]: [facility] [100102]  Level of Care: Telemetry [5]  Admit to tele based on following criteria: Acute CHF  Diagnosis: Diastolic CHF [facility]) [048889]  Admitting Physician: Velta Addison  Attending Physician: CHIU, STEPHEN K [[id number]]  PT Class (Do Not Modify): Observation [104]  PT Acc Code (Do Not Modify): Observation [10022]       Medical History Past Medical History:  Diagnosis Date  . Adenomatous polyps 09/09/2008  . Arthritis   . Carpal tunnel syndrome, bilateral 11/21/2016  . Colon cancer ([place]) dx'd 2008   surg only  . Diabetes mellitus without complication ([place])   . Falls frequently   . Hypertension   . Prostate cancer ([place]) dx'd 1993   surg only    Allergies No Known Allergies  IV Location/Drains/Wounds Patient Lines/Drains/Airways Status   Active Line/Drains/Airways    Name:   Placement date:   Placement time:   Site:   Days:   Peripheral IV 12/20/18 Right Antecubital   12/20/18    0440    Antecubital   less than 1          Labs/Imaging Results for orders placed or performed during the hospital encounter of 12/20/18 (from the past 48 hour(s))  CBC with Differential/Platelet     Status: Abnormal   Collection Time: 12/20/18  4:50 AM  Result Value Ref Range   WBC 5.7 4.0 - 10.5 K/uL   RBC  3.79 (L) 4.22 - 5.81 MIL/uL   Hemoglobin 10.3 (L) 13.0 - 17.0 g/dL   HCT 33.0 (L) 39.0 - 52.0 %   MCV 87.1 80.0 - 100.0 fL   MCH 27.2 26.0 - 34.0 pg   MCHC 31.2 30.0 - 36.0 g/dL   RDW 15.5 11.5 - 15.5 %   Platelets 195 150 - 400 K/uL   nRBC 0.0 0.0 - 0.2 %   Neutrophils Relative % 66 %   Neutro Abs 3.8 1.7 - 7.7 K/uL   Lymphocytes Relative 24 %   Lymphs Abs 1.4 0.7 - 4.0 K/uL   Monocytes Relative 7 %   Monocytes Absolute 0.4 0.1 - 1.0 K/uL   Eosinophils Relative 3 %   Eosinophils Absolute 0.2 0.0 - 0.5 K/uL   Basophils Relative 0 %   Basophils Absolute 0.0 0.0 - 0.1 K/uL   WBC Morphology MORPHOLOGY UNREMARKABLE    Immature Granulocytes 0 %   Abs Immature Granulocytes 0.01 0.00 - 0.07 K/uL    Comment: Performed at [facility], [place] [address] [place], [place] [address]  Brain natriuretic peptide     Status: Abnormal   Collection Time: 12/20/18  4:50 AM  Result Value Ref Range   B Natriuretic  Peptide 370.1 (H) 0.0 - 100.0 pg/mL    Comment: Performed at [facility], [place] [address] [place], [place] [address]  Comprehensive metabolic panel     Status: Abnormal   Collection Time: 12/20/18  4:50 AM  Result Value Ref Range   Sodium 142 135 - 145 mmol/L   Potassium 3.2 (L) 3.5 - 5.1 mmol/L   Chloride 110 98 - 111 mmol/L   CO2 23 22 - 32 mmol/L   Glucose, Bld 124 (H) 70 - 99 mg/dL   BUN 28 (H) 8 - 23 mg/dL   Creatinine, Ser 2.04 (H) 0.61 - 1.24 mg/dL   Calcium 8.9 8.9 - 10.3 mg/dL   Total Protein 6.9 6.5 - 8.1 g/dL   Albumin 3.4 (L) 3.5 - 5.0 g/dL   AST 24 15 - 41 U/L   ALT 12 0 - 44 U/L   Alkaline Phosphatase 94 38 - 126 U/L   Total Bilirubin 0.8 0.3 - 1.2 mg/dL   GFR calc non Af Amer 27 (L) >60 mL/min   GFR calc Af Amer 32 (L) >60 mL/min   Anion gap 9 5 - 15    Comment: Performed at [facility], [place] [address], [place], [place] [address]  Troponin I - ONCE - STAT     Status: None   Collection Time: 12/20/18  4:50  AM  Result Value Ref Range   Troponin I <0.03 <0.03 ng/mL    Comment: Performed at [facility], [place] [address], [place], [place] [address]  Creatinine, serum     Status: Abnormal   Collection Time: 12/20/18  5:19 AM  Result Value Ref Range   Creatinine, Ser 2.03 (H) 0.61 - 1.24 mg/dL   GFR calc non Af Amer 28 (L) >60 mL/min   GFR calc Af Amer 32 (L) >60 mL/min    Comment: Performed at [facility], [place] [address] [place], [place] [address]  I-Stat Troponin, ED (not at [facility])     Status: None   Collection Time: 12/20/18  8:23 AM  Result Value Ref Range   Troponin i, poc 0.03 0.00 - 0.08 ng/mL   Comment 3            Comment: Due to the release kinetics of cTnI, a negative result within the first hours of the onset of symptoms does not rule out myocardial infarction with certainty. If myocardial infarction is still suspected, repeat the test at appropriate intervals.    Dg Chest 2 View  Result Date: 12/20/2018 CLINICAL DATA:  Initial evaluation for acute shortness of breath. EXAM: CHEST - 2 VIEW COMPARISON:  Prior radiograph from 10/23/2017 FINDINGS: Mild cardiomegaly, stable. Mediastinal silhouette within normal limits. Lungs are hypoinflated. Perihilar vascular congestion without frank pulmonary edema. Small right pleural effusion. Superimposed bibasilar atelectatic changes. No consolidative opacity. No pneumothorax. No acute osseous abnormality. IMPRESSION: 1. Stable cardiomegaly with mild perihilar vascular congestion without overt pulmonary edema. 2. Small right pleural effusion. 3. Low lung volumes with superimposed bibasilar atelectatic changes. Electronically Signed   By: Jeannine Boga M.D.   On: 12/20/2018 04:53   EKG Interpretation  Date/Time:  Saturday December 20 2018 04:05:06 EST Ventricular Rate:  71 PR Interval:    QRS Duration: 145 QT Interval:  442 QTC Calculation: 481 R Axis:   48 Text Interpretation:  Sinus rhythm Left  bundle branch block No significant change since last tracing Confirmed by Addison Lank [phone]) on 12/20/2018 4:15:41 AM  Pending Labs Unresulted Labs (From admission, onward)    Start     Ordered   12/27/18 0500  Creatinine, serum  (enoxaparin (LOVENOX)    CrCl >/= 30 ml/min)  Weekly,   R    Comments:  while on enoxaparin therapy    12/20/18 0847   12/21/18 0500  Comprehensive metabolic panel  Tomorrow morning,   R     12/20/18 0847   12/21/18 0500  CBC  Tomorrow morning,   R     12/20/18 0847          Vitals/Pain Today's Vitals   12/20/18 0816 12/20/18 0908 12/20/18 0909 12/20/18 0931  BP: (!) 161/65 (!) 173/61 (!) 173/61 (!) 148/66  Pulse: 65 62 67 (!) 26  Resp: 18  18 20   Temp:      SpO2: 97% 100% 97% 99%  PainSc:    0-No pain    Isolation Precautions No active isolations  Medications Medications  enoxaparin (LOVENOX) injection 40 mg (has no administration in time range)  acetaminophen (TYLENOL) tablet 650 mg (has no administration in time range)    Or  acetaminophen (TYLENOL) suppository 650 mg (has no administration in time range)  furosemide (LASIX) injection 40 mg (has no administration in time range)  amLODipine (NORVASC) tablet 10 mg (has no administration in time range)  aspirin EC tablet 81 mg (has no administration in time range)  citalopram (CELEXA) tablet 10 mg (has no administration in time range)  enzalutamide (XTANDI) capsule 40 mg (has no administration in time range)  sacubitril-valsartan (ENTRESTO) 24-26 mg per tablet (has no administration in time range)  furosemide (LASIX) injection 40 mg (40 mg Intravenous Given 12/20/18 0654)    Mobility walks with device

## 2018-12-20 NOTE — ED Notes (Signed)
EKG given to EDP,Cardama,MD., for review. 

## 2018-12-20 NOTE — ED Notes (Signed)
Bed: RESA Expected date:  Expected time:  Means of arrival:  Comments: 58 M Respiratory Distress.

## 2018-12-20 NOTE — ED Notes (Signed)
hospitalalist at bedside

## 2018-12-20 NOTE — ED Notes (Signed)
Pt stood up to void at bedside. When standing pt appears SOB and weak, pt coughing. O2 98% on RA, notified EDP, EDP at bedside.

## 2018-12-20 NOTE — ED Notes (Signed)
Bed: TN53 Expected date:  Expected time:  Means of arrival:  Comments: Hold for Resus A

## 2018-12-20 NOTE — H&P (Signed)
History and Physical    [name] BCW:[id number] DOB: 05/02/1924 DOA: 12/20/2018  PCP: Lucianne Lei, MD  Patient coming from: Home  Chief Complaint: SOB  HPI: [name] is a 85 y.o. male with medical history significant of diastolic CHF, hx colon cancer, HTN, prostate cancer who presents to the ED with increased sob and dyspnea. Patient is poor historian and majority of history obtained from chart review and speaking with staff. Pt was recently discharged for acute diastolic CHF two weeks prior to ED visit, after responding well to IV lasix. Pt was discharged with limited quantity of PO lasix with instruction to f/u with PCP to determined whether to continue. During this time, pt reports increased sob and decreased exercise tolerance, ultimately prompting ED visit. Pt is not aware of swelling, denies chest pain.   ED Course: In the ED, pt noted to have BNP in excess of 300 with vascular congestion noted on CXR. Pt was given one dose of IV lasix with no significant change. On trial of ambulation, pt noted to have increased dyspnea and fatigue thus hospitaliist service consulted for consideration for medical admission.  Review of Systems:  Review of Systems  Constitutional: Positive for malaise/fatigue. Negative for chills and fever.  HENT: Negative for congestion, ear discharge, ear pain and nosebleeds.   Eyes: Negative for double vision, photophobia and pain.  Respiratory: Positive for shortness of breath. Negative for sputum production.   Cardiovascular: Positive for leg swelling. Negative for chest pain, palpitations and claudication.  Gastrointestinal: Negative for abdominal pain, constipation, diarrhea and vomiting.  Genitourinary: Negative for frequency, hematuria and urgency.  Musculoskeletal: Negative for back pain, joint pain and neck pain.  Neurological: Negative for sensory change, speech change, seizures and loss of consciousness.  Psychiatric/Behavioral: Negative for  hallucinations. The patient is not nervous/anxious and does not have insomnia.     Past Medical History:  Diagnosis Date  . Adenomatous polyps 09/09/2008  . Arthritis   . Carpal tunnel syndrome, bilateral 11/21/2016  . Colon cancer ([place]) dx'd 2008   surg only  . Diabetes mellitus without complication ([place])   . Falls frequently   . Hypertension   . Prostate cancer ([place]) dx'd 1993   surg only    Past Surgical History:  Procedure Laterality Date  . CATARACT EXTRACTION W/PHACO Left 06/10/2013   Procedure: CATARACT EXTRACTION PHACO AND INTRAOCULAR LENS PLACEMENT (IOC);  Surgeon: Adonis Brook, MD;  Location: [facility];  Service: Ophthalmology;  Laterality: Left;  . COLONOSCOPY    . ESOPHAGOGASTRODUODENOSCOPY    . EYE SURGERY     cataract surgery, right eye  . laparoscopic assisted right hemicolectomy  09/21/07     reports that he quit smoking about 35 years ago. His smoking use included cigarettes. He quit after 1.00 year of use. He has never used smokeless tobacco. He reports current alcohol use. He reports that he does not use drugs.  No Known Allergies  Family History  Problem Relation Age of Onset  . Diabetes Other     Prior to Admission medications   Medication Sig Start Date End Date Taking? Authorizing Provider  ENTRESTO 24-26 MG Take 1 tablet by mouth 2 (two) times daily. 11/17/18  Yes [provider]  amLODipine (NORVASC) 10 MG tablet Take 10 mg by mouth daily.    [provider]  aspirin EC 81 MG tablet Take 1 tablet (81 mg total) by mouth daily. 05/22/14   Reyne Dumas, MD  cephALEXin (KEFLEX) 500 MG capsule Take 1  capsule (500 mg total) by mouth 3 (three) times daily. 11/30/18   Ward, Delice Bison, DO  citalopram (CELEXA) 10 MG tablet Take 10 mg by mouth daily. 11/24/17   [provider]  furosemide (LASIX) 40 MG tablet Take 1 tablet (40 mg total) by mouth daily. (take for 3 days then stop and follow up with PCP to determine whether to continue) 12/05/17  01/04/18  Elodia Florence., MD  potassium chloride (KLOR-CON) 20 MEQ packet Take 20 mEq by mouth daily. (take for 3 days, then stop and follow up labs with PCP to determine whether to continue) 12/05/17 01/04/18  Elodia Florence., MD  XTANDI 40 MG capsule Take 40 mg by mouth daily. 10/01/17   [provider]    Physical Exam: Vitals:   12/20/18 0645 12/20/18 0700 12/20/18 0712 12/20/18 0816  BP: (!) 136/58 (!) 143/54 (!) 143/54 (!) 161/65  Pulse: (!) 59 63 63 65  Resp:   20 18  Temp:      SpO2: 99% 97% 98% 97%    Constitutional: NAD, calm, comfortable Vitals:   12/20/18 0645 12/20/18 0700 12/20/18 0712 12/20/18 0816  BP: (!) 136/58 (!) 143/54 (!) 143/54 (!) 161/65  Pulse: (!) 59 63 63 65  Resp:   20 18  Temp:      SpO2: 99% 97% 98% 97%   Eyes: PERRL, lids and conjunctivae normal ENMT: Mucous membranes are moist. Posterior pharynx clear of any exudate or lesions.Normal dentition.  Neck: normal, supple, no masses, no thyromegaly Respiratory: clear to auscultation bilaterally, no wheezing, no crackles. Normal respiratory effort. No accessory muscle use.  Cardiovascular: Regular rate and rhythm, s1, s2  Abdomen: no tenderness, no masses palpated. No hepatosplenomegaly. Bowel sounds positive.  Musculoskeletal: no clubbing / cyanosis. No joint deformity upper and lower extremities. Good ROM, no contractures. Normal muscle tone.  Skin: no rashes, lesions, ulcers. Trace non-pitting LE edema Neurologic: CN 2-12 grossly intact. Sensation intact, DTR normal. Strength 5/5 in all 4.  Psychiatric: Normal judgment and insight. Alert and oriented x 3. Normal mood.    Labs on Admission: I have personally reviewed following labs and imaging studies  CBC: Recent Labs  Lab 12/20/18 0450  WBC 5.7  NEUTROABS 3.8  HGB 10.3*  HCT 33.0*  MCV 87.1  PLT 732   Basic Metabolic Panel: Recent Labs  Lab 12/20/18 0450  NA 142  K 3.2*  CL 110  CO2 23  GLUCOSE 124*  BUN 28*    CREATININE 2.04*  CALCIUM 8.9   GFR: CrCl cannot be calculated (Unknown ideal weight.). Liver Function Tests: Recent Labs  Lab 12/20/18 0450  AST 24  ALT 12  ALKPHOS 94  BILITOT 0.8  PROT 6.9  ALBUMIN 3.4*   No results for input(s): LIPASE, AMYLASE in the last 168 hours. No results for input(s): AMMONIA in the last 168 hours. Coagulation Profile: No results for input(s): INR, PROTIME in the last 168 hours. Cardiac Enzymes: Recent Labs  Lab 12/20/18 0450  TROPONINI <0.03   BNP (last 3 results) No results for input(s): PROBNP in the last 8760 hours. HbA1C: No results for input(s): HGBA1C in the last 72 hours. CBG: No results for input(s): GLUCAP in the last 168 hours. Lipid Profile: No results for input(s): CHOL, HDL, LDLCALC, TRIG, CHOLHDL, LDLDIRECT in the last 72 hours. Thyroid Function Tests: No results for input(s): TSH, T4TOTAL, FREET4, T3FREE, THYROIDAB in the last 72 hours. Anemia Panel: No results for input(s): VITAMINB12, FOLATE, FERRITIN,  TIBC, IRON, RETICCTPCT in the last 72 hours. Urine analysis:    Component Value Date/Time   COLORURINE RED (A) 11/30/2018 0327   APPEARANCEUR TURBID (A) 11/30/2018 0327   LABSPEC 1.015 11/30/2018 0327   PHURINE 6.5 11/30/2018 0327   GLUCOSEU NEGATIVE 11/30/2018 0327   HGBUR LARGE (A) 11/30/2018 0327   BILIRUBINUR NEGATIVE 11/30/2018 0327   KETONESUR NEGATIVE 11/30/2018 0327   PROTEINUR >300 (A) 11/30/2018 0327   UROBILINOGEN 1.0 04/07/2010 1747   NITRITE POSITIVE (A) 11/30/2018 0327   LEUKOCYTESUR TRACE (A) 11/30/2018 0327   Sepsis Labs: !!!!!!!!!!!!!!!!!!!!!!!!!!!!!!!!!!!!!!!!!!!! @LABRCNTIP (procalcitonin:4,lacticidven:4) )No results found for this or any previous visit (from the past 240 hour(s)).   Radiological Exams on Admission: Dg Chest 2 View  Result Date: 12/20/2018 CLINICAL DATA:  Initial evaluation for acute shortness of breath. EXAM: CHEST - 2 VIEW COMPARISON:  Prior radiograph from 10/23/2017  FINDINGS: Mild cardiomegaly, stable. Mediastinal silhouette within normal limits. Lungs are hypoinflated. Perihilar vascular congestion without frank pulmonary edema. Small right pleural effusion. Superimposed bibasilar atelectatic changes. No consolidative opacity. No pneumothorax. No acute osseous abnormality. IMPRESSION: 1. Stable cardiomegaly with mild perihilar vascular congestion without overt pulmonary edema. 2. Small right pleural effusion. 3. Low lung volumes with superimposed bibasilar atelectatic changes. Electronically Signed   By: Jeannine Boga M.D.   On: 12/20/2018 04:53    EKG: Independently reviewed. Sinus  Assessment/Plan Principal Problem:   Diastolic CHF (HCC) Active Problems:   Essential hypertension   Personal history of colon cancer, stage II   Acute CHF (congestive heart failure) ([place])   1. Acutely decompensated chronic diastolic CHF 1. Recently admitted for acute CHF improved with IV lasix 2. Pt was discharged on limited quantity of po lasix, has not been taking since 3. On exam, mild non-pitting edema noted. CXR personally reviewed with findings of vascular congestion, albeit improved from CXR at prior admit. In ED, unable to stand for prolonged time before having increased dyspnea 4. Will continue on Lasix as tolerated 5. Repeat BMET in AM 2. HTN 1. BP mildly elevated 2. Cont home meds as tolerated 3. Hx colon cancer 1. Stable at this time 4. CKD 3 1. Presenting Cr of 2 which seems to be near baseline on chart review 2. Cont Lasix as tolerated, repeat bmet in AM  DVT prophylaxis: Lovenox subQ  Code Status: Full Family Communication: Pt in room, family not at bedside  Disposition Plan: Uncertain at this time  Consults called:  Admission status: Observation as would likely require less than 2 midnight stay to diurese   Marylu Lund MD [facility] Pager On Amion  If 7PM-7AM, please contact night-coverage  12/20/2018, 8:55 AM

## 2018-12-20 NOTE — ED Notes (Signed)
Spoke with lab, able to add on creatine

## 2018-12-20 NOTE — ED Triage Notes (Signed)
Patient arrives via EMS for complaints of SOB. Patient expiratory wheezing throughout x1 day. Given duoneb by EMS. Productive cough with clear sputum. No other symptoms. No N/V/D, chest pain.

## 2018-12-21 DIAGNOSIS — I5033 Acute on chronic diastolic (congestive) heart failure: Secondary | ICD-10-CM | POA: Diagnosis not present

## 2018-12-21 DIAGNOSIS — R0602 Shortness of breath: Secondary | ICD-10-CM | POA: Diagnosis not present

## 2018-12-21 DIAGNOSIS — Z85038 Personal history of other malignant neoplasm of large intestine: Secondary | ICD-10-CM | POA: Diagnosis not present

## 2018-12-21 DIAGNOSIS — I5031 Acute diastolic (congestive) heart failure: Secondary | ICD-10-CM

## 2018-12-21 DIAGNOSIS — I10 Essential (primary) hypertension: Secondary | ICD-10-CM | POA: Diagnosis not present

## 2018-12-21 LAB — COMPREHENSIVE METABOLIC PANEL
ALT: 12 U/L (ref 0–44)
AST: 21 U/L (ref 15–41)
Albumin: 3.2 g/dL — ABNORMAL LOW (ref 3.5–5.0)
Alkaline Phosphatase: 90 U/L (ref 38–126)
Anion gap: 7 (ref 5–15)
BUN: 38 mg/dL — ABNORMAL HIGH (ref 8–23)
CO2: 26 mmol/L (ref 22–32)
Calcium: 8.8 mg/dL — ABNORMAL LOW (ref 8.9–10.3)
Chloride: 110 mmol/L (ref 98–111)
Creatinine, Ser: 2.38 mg/dL — ABNORMAL HIGH (ref 0.61–1.24)
GFR calc Af Amer: 26 mL/min — ABNORMAL LOW (ref >60)
GFR calc non Af Amer: 23 mL/min — ABNORMAL LOW (ref >60)
Glucose, Bld: 74 mg/dL (ref 70–99)
Potassium: 3.7 mmol/L (ref 3.5–5.1)
Sodium: 143 mmol/L (ref 135–145)
Total Bilirubin: 0.9 mg/dL (ref 0.3–1.2)
Total Protein: 6.7 g/dL (ref 6.5–8.1)

## 2018-12-21 LAB — CBC
HCT: 32.4 % — ABNORMAL LOW (ref 39.0–52.0)
Hemoglobin: 10.1 g/dL — ABNORMAL LOW (ref 13.0–17.0)
MCH: 26.9 pg (ref 26.0–34.0)
MCHC: 31.2 g/dL (ref 30.0–36.0)
MCV: 86.4 fL (ref 80.0–100.0)
NRBC: 0 % (ref 0.0–0.2)
Platelets: 185 10*3/uL (ref 150–400)
RBC: 3.75 MIL/uL — AB (ref 4.22–5.81)
RDW: 15.4 % (ref 11.5–15.5)
WBC: 5.2 10*3/uL (ref 4.0–10.5)

## 2018-12-21 MED ORDER — NEOMYCIN-POLYMYXIN-HC 3.5-10000-1 OP SUSP: 4.0000 [drp] | Freq: Four times a day (QID) | OPHTHALMIC | 0 refills | Status: DC

## 2018-12-21 MED ORDER — FUROSEMIDE 40 MG PO TABS: 40.0000 mg | ORAL_TABLET | Freq: Every day | ORAL | 0 refills | Status: DC

## 2018-12-21 MED ORDER — FUROSEMIDE 40 MG PO TABS
40.0000 mg | ORAL_TABLET | Freq: Every day | ORAL | Status: DC
  Administered 2018-12-21: 40 mg via ORAL
  Filled 2018-12-21: qty 1

## 2018-12-21 NOTE — Progress Notes (Signed)
Patient ambulated with PT with a front wheel walker, tolerated well. He sat up in a chair for about an hour and a half. Flu vaccine given to patient in Right arm. Discharge instructions given to wife and questions answered.Prescriptions also given to wife. Personal belongings returned to wife from security. Patient discharged via wheelchair. Case manager to set up home PT/OT.

## 2018-12-21 NOTE — Discharge Instructions (Addendum)
Biventricular Pacemaker Implantation, Care After Refer to this sheet in the next few weeks. These instructions provide you with information about caring for yourself after your procedure. Your health care provider may also give you more specific instructions. Your treatment has been planned according to current medical practices, but problems sometimes occur. Call your health care provider if you have any problems or questions after your procedure. What can I expect after the procedure? After the procedure, it is common to have:  Mild pain or soreness in your chest for several days.  A small amount of blood or clear fluid coming from your incision.  A slight bump in your chest where the pulse generator was placed. You may be able to feel the generator under your skin. This is normal. Follow these instructions at home: Medicines  Take over-the-counter and prescription medicines only as told by your health care provider.  Do not take any new medicines without asking your health care provider first.  If you were prescribed an antibiotic medicine, take it as told by your health care provider. Do not stop taking the antibiotic even if you start to feel better. Incision care      Keep your incision area clean and dry.  Follow instructions from your health care provider about how to take care of your incision. Make sure you: ? Wash your hands with soap and water before you change your bandage (dressing). If soap and water are not available, use hand sanitizer. ? Change your dressing as told by your health care provider. ? Leave stitches (sutures), skin glue, or adhesive strips in place. These skin closures may need to stay in place for 2 weeks or longer. If adhesive strip edges start to loosen and curl up, you may trim the loose edges. Do not remove adhesive strips completely unless your health care provider tells you to do that.  Check your incision area every day for signs of infection.  Check for: ? More redness, swelling, or pain. ? More fluid or blood. ? Warmth. ? Pus or a bad smell. Activity  Return to your normal activities as told by your health care provider. Ask your health care provider what activities are safe for you.  Do not lift anything that is heavier than 10 lb (4.5 kg) until your health care provider approves.  Do not lift your upper arms above your shoulders for at least 6 weeks or as long as told by your health care provider. ? If you sleep with your arms above your head, wear an arm restraint while you sleep to prevent this from happening. ? Avoid sudden movements that pull your upper arms far away from your body for at least 6 weeks.  Do a mild form of exercise at least once a day. As you feel better, you may exercise more.  Gently stretch your shoulders at least once a day to help prevent stiffness in your chest. Electricity and Magnetic Fields  Avoid places and objects that have a strong electric or magnetic field. This includes: ? Airport Data processing manager. When you are at the airport, tell officials that you have a pacemaker and show them your pacemaker identification card. Officials will check you in safely so that your pacemaker is not damaged. Do not allow magnetic wands to be waved near your pacemaker. That can make the pacemaker stop working. ? Metal detectors. If you must pass through a metal detector, walk through it quickly. Do not stop under the detector or stand  near it. ? Power plants. ? Large electrical generators. ? Radiofrequency transmission towers, such as cell phone and radio towers.  Do not use amateur ("ham") radio equipment or electric ("arc") Clinical cytogeneticist. If you are not sure whether something is safe to use, ask your health care provider. ? Some devices may be safe to use if you hold them at least 1 ft (0.3 m) from your pacemaker. These devices may include power tools, lawn mowers, and speakers.  When you talk on your  cell phone, hold it to your ear that is opposite from the side that your pacemaker is on. Do not leave your cell phone in a pocket over your pacemaker. Long-Term Care  Carry your pacemaker identification card with you at all times, especially when you travel.  Consider wearing a medical alert bracelet or necklace that explains your pacemaker and any heart conditions you have.  Tell all health care providers who care for you that you have a pacemaker. This may prevent you from having an MRI because of the strong magnets used during that test.  Have your pacemaker checked every 3-6 months or as often as told by your health care provider. General instructions  Do not use any tobacco products, such as cigarettes, chewing tobacco, or e-cigarettes. If you need help quitting, ask your health care provider.  Do not drive or operate heavy machinery while taking prescription pain medicine.  Do not take baths, swim, or use a hot tub until your health care provider approves.  Follow instructions from your health care provider about eating or drinking restrictions.  Weigh yourself every day and write down your weight.  Keep all follow-up visits as told by your health care provider. This is important. Contact a health care provider if:  You suddenly gain 3 lb (1.4 kg) or more in 24 hours.  You have swelling in your feet or legs.  You have an irregular heartbeat (palpitations).  You have more redness, swelling, or pain around your incision.  You have more fluid or blood coming from your incision.  Your incision area feels warm to the touch.  You have pus or a bad smell coming from your incision. Get help right away if:  You have chest pain.  You have difficulty breathing.  You suddenly feel light-headed.  You have a fever.  You faint. These symptoms may represent a serious problem that is an emergency. Do not wait to see if the symptoms will go away. Get medical help right away.  Call your local emergency services (911 in the [place] Do not drive yourself to the hospital. This information is not intended to replace advice given to you by your health care provider. Make sure you discuss any questions you have with your health care provider. Document Released: 07/02/2012 Document Revised: 05/26/2018 Document Reviewed: 07/03/2015 Elsevier Interactive Patient Education  2019 [place] What is cardiac rehabilitation? Cardiac rehabilitation is a treatment program that helps improve the health and well-being of people who have heart problems. Cardiac rehabilitation includes exercise training, education, and counseling to help you get stronger and return to an active lifestyle. This program can help you get better faster and reduce any future hospital stays. Why might I need cardiac rehabilitation? Cardiac rehabilitation programs can help when you have or have had:  A heart attack.  Heart failure.  Peripheral artery disease.  Coronary artery disease.  Angina.  Lung or breathing problems. Cardiac rehabilitation programs are also used when you  have had:  Coronary artery bypass graft surgery.  Heart valve replacement.  Heart stent placement.  Heart transplant.  Aneurysm repair. What are the benefits of cardiac rehabilitation? Cardiac rehabilitation can help:  Reduce problems like chest pain and trouble breathing.  Change risk factors that contribute to heart disease, such as: ? Smoking. ? High blood pressure. ? High cholesterol. ? Diabetes. ? Being out of shape or not active. ? Weighing more than 30% higher than your ideal weight. ? Diet.  Improve your mental outlook so you feel: ? More hopeful. ? Better about yourself. ? More confident about taking care of yourself.  Get support from health experts as well as other people with similar problems.  Learn how to manage and understand your medicines.  Teach your family  about your condition and how to participate in your recovery. What happens in cardiac rehabilitation? You will be assessed by a cardiac rehabilitation team. They will check your health history and do a physical exam. You may need blood tests, stress tests, and other evaluations to make sure that you are ready to start cardiac rehabilitation. The cardiac rehabilitation team works with you to make a plan based on your health and goals. Your program will be tailored to fit you and your needs and may change as you progress. You may work with a health care team that includes:  Doctors.  Nurses.  Dietitians.  Psychologists.  Exercise specialists.  Physical and occupational therapists. What are the phases of cardiac rehabilitation? A cardiac rehabilitation program is often divided into phases. You advance from one phase to the next. Phase One  This phase starts while you are still in the hospital. You may start by walking in your room and then in the hall. You may start some simple exercises with a therapist. Phase Two  This phase begins when you go home or to another facility. This phase may last 8-12 weeks. You will travel to a cardiac rehabilitation center or another place where rehabilitation is offered. You will slowly increase your activity level while being closely watched by a nurse or therapist. Exercises may include a combination of strength or resistance training and cardio or aerobic movement on a treadmill or other machines. Your condition will determine how often and how long these sessions last. In phase two, you may learn how to cook healthy meals, control your blood sugar, and manage your medicines. You may need help with scheduling or planning how and when to take your medicines. If you have questions about your medicines, it is very important that you talk to your health care provider. Phase Three This phase continues for the rest of your life. There will be less supervision.  You may still participate in cardiac rehabilitation activities or become part of a group in your community. You may benefit from talking about your experience with other people who are facing similar challenges. Get help right away if:  You have severe chest discomfort, especially if the pain is crushing or pressure-like and spreads to your arms, back, neck, or jaw. Do not wait to see if the pain will go away.  You have weakness or numbness in your face, arms, or legs, especially on one side of the body.  Your speech is slurred.  You are confused.  You have a sudden severe headache or loss of vision.  You have shortness of breath.  You are sweating and have nausea.  You feel dizzy or faint.  You are fatigued. These symptoms  may represent a serious problem that is an emergency. Do not wait to see if the symptoms will go away. Get medical help right away. Call your local emergency services (911 in the [place] Do not drive yourself to the hospital. This information is not intended to replace advice given to you by your health care provider. Make sure you discuss any questions you have with your health care provider. Document Released: 07/17/2008 Document Revised: 07/01/2018 Document Reviewed: 08/22/2015 Elsevier Interactive Patient Education  2019 [place] What is cardiac rehabilitation? Cardiac rehabilitation is a treatment program that helps improve the health and well-being of people who have heart problems. Cardiac rehabilitation includes exercise training, education, and counseling to help you get stronger and return to an active lifestyle. This program can help you get better faster and reduce any future hospital stays. Why might I need cardiac rehabilitation? Cardiac rehabilitation programs can help when you have or have had:  A heart attack.  Heart failure.  Peripheral artery disease.  Coronary artery disease.  Angina.  Lung or breathing  problems. Cardiac rehabilitation programs are also used when you have had:  Coronary artery bypass graft surgery.  Heart valve replacement.  Heart stent placement.  Heart transplant.  Aneurysm repair. What are the benefits of cardiac rehabilitation? Cardiac rehabilitation can help:  Reduce problems like chest pain and trouble breathing.  Change risk factors that contribute to heart disease, such as: ? Smoking. ? High blood pressure. ? High cholesterol. ? Diabetes. ? Being out of shape or not active. ? Weighing more than 30% higher than your ideal weight. ? Diet.  Improve your mental outlook so you feel: ? More hopeful. ? Better about yourself. ? More confident about taking care of yourself.  Get support from health experts as well as other people with similar problems.  Learn how to manage and understand your medicines.  Teach your family about your condition and how to participate in your recovery. What happens in cardiac rehabilitation? You will be assessed by a cardiac rehabilitation team. They will check your health history and do a physical exam. You may need blood tests, stress tests, and other evaluations to make sure that you are ready to start cardiac rehabilitation. The cardiac rehabilitation team works with you to make a plan based on your health and goals. Your program will be tailored to fit you and your needs and may change as you progress. You may work with a health care team that includes:  Doctors.  Nurses.  Dietitians.  Psychologists.  Exercise specialists.  Physical and occupational therapists. What are the phases of cardiac rehabilitation? A cardiac rehabilitation program is often divided into phases. You advance from one phase to the next. Phase One  This phase starts while you are still in the hospital. You may start by walking in your room and then in the hall. You may start some simple exercises with a therapist. Phase Two  This phase  begins when you go home or to another facility. This phase may last 8-12 weeks. You will travel to a cardiac rehabilitation center or another place where rehabilitation is offered. You will slowly increase your activity level while being closely watched by a nurse or therapist. Exercises may include a combination of strength or resistance training and cardio or aerobic movement on a treadmill or other machines. Your condition will determine how often and how long these sessions last. In phase two, you may learn how to cook healthy meals,  control your blood sugar, and manage your medicines. You may need help with scheduling or planning how and when to take your medicines. If you have questions about your medicines, it is very important that you talk to your health care provider. Phase Three This phase continues for the rest of your life. There will be less supervision. You may still participate in cardiac rehabilitation activities or become part of a group in your community. You may benefit from talking about your experience with other people who are facing similar challenges. Get help right away if:  You have severe chest discomfort, especially if the pain is crushing or pressure-like and spreads to your arms, back, neck, or jaw. Do not wait to see if the pain will go away.  You have weakness or numbness in your face, arms, or legs, especially on one side of the body.  Your speech is slurred.  You are confused.  You have a sudden severe headache or loss of vision.  You have shortness of breath.  You are sweating and have nausea.  You feel dizzy or faint.  You are fatigued. These symptoms may represent a serious problem that is an emergency. Do not wait to see if the symptoms will go away. Get medical help right away. Call your local emergency services (911 in the [place] Do not drive yourself to the hospital. This information is not intended to replace advice given to you by your health care  provider. Make sure you discuss any questions you have with your health care provider. Document Released: 07/17/2008 Document Revised: 07/01/2018 Document Reviewed: 08/22/2015 Elsevier Interactive Patient Education  2019 [place] What is cardiac rehabilitation? Cardiac rehabilitation is a treatment program that helps improve the health and well-being of people who have heart problems. Cardiac rehabilitation includes exercise training, education, and counseling to help you get stronger and return to an active lifestyle. This program can help you get better faster and reduce any future hospital stays. Why might I need cardiac rehabilitation? Cardiac rehabilitation programs can help when you have or have had:  A heart attack.  Heart failure.  Peripheral artery disease.  Coronary artery disease.  Angina.  Lung or breathing problems. Cardiac rehabilitation programs are also used when you have had:  Coronary artery bypass graft surgery.  Heart valve replacement.  Heart stent placement.  Heart transplant.  Aneurysm repair. What are the benefits of cardiac rehabilitation? Cardiac rehabilitation can help:  Reduce problems like chest pain and trouble breathing.  Change risk factors that contribute to heart disease, such as: ? Smoking. ? High blood pressure. ? High cholesterol. ? Diabetes. ? Being out of shape or not active. ? Weighing more than 30% higher than your ideal weight. ? Diet.  Improve your mental outlook so you feel: ? More hopeful. ? Better about yourself. ? More confident about taking care of yourself.  Get support from health experts as well as other people with similar problems.  Learn how to manage and understand your medicines.  Teach your family about your condition and how to participate in your recovery. What happens in cardiac rehabilitation? You will be assessed by a cardiac rehabilitation team. They will check your  health history and do a physical exam. You may need blood tests, stress tests, and other evaluations to make sure that you are ready to start cardiac rehabilitation. The cardiac rehabilitation team works with you to make a plan based on your health and goals. Your program will  be tailored to fit you and your needs and may change as you progress. You may work with a health care team that includes:  Doctors.  Nurses.  Dietitians.  Psychologists.  Exercise specialists.  Physical and occupational therapists. What are the phases of cardiac rehabilitation? A cardiac rehabilitation program is often divided into phases. You advance from one phase to the next. Phase One  This phase starts while you are still in the hospital. You may start by walking in your room and then in the hall. You may start some simple exercises with a therapist. Phase Two  This phase begins when you go home or to another facility. This phase may last 8-12 weeks. You will travel to a cardiac rehabilitation center or another place where rehabilitation is offered. You will slowly increase your activity level while being closely watched by a nurse or therapist. Exercises may include a combination of strength or resistance training and cardio or aerobic movement on a treadmill or other machines. Your condition will determine how often and how long these sessions last. In phase two, you may learn how to cook healthy meals, control your blood sugar, and manage your medicines. You may need help with scheduling or planning how and when to take your medicines. If you have questions about your medicines, it is very important that you talk to your health care provider. Phase Three This phase continues for the rest of your life. There will be less supervision. You may still participate in cardiac rehabilitation activities or become part of a group in your community. You may benefit from talking about your experience with other people who  are facing similar challenges. Get help right away if:  You have severe chest discomfort, especially if the pain is crushing or pressure-like and spreads to your arms, back, neck, or jaw. Do not wait to see if the pain will go away.  You have weakness or numbness in your face, arms, or legs, especially on one side of the body.  Your speech is slurred.  You are confused.  You have a sudden severe headache or loss of vision.  You have shortness of breath.  You are sweating and have nausea.  You feel dizzy or faint.  You are fatigued. These symptoms may represent a serious problem that is an emergency. Do not wait to see if the symptoms will go away. Get medical help right away. Call your local emergency services (911 in the [place] Do not drive yourself to the hospital. This information is not intended to replace advice given to you by your health care provider. Make sure you discuss any questions you have with your health care provider. Document Released: 07/17/2008 Document Revised: 07/01/2018 Document Reviewed: 08/22/2015 Elsevier Interactive Patient Education  2019 [employer] Ambulatory Pulmonary Artery Pressure Monitoring  Ambulatory pulmonary artery pressure monitoring is a way to help your health care provider treat your heart failure. It involves having a procedure to place (implant) a permanent pressure sensor inside your pulmonary artery. Your pulmonary artery carries blood from the right side of your heart to your lungs. Increased pressure in this artery is an early sign that your heart failure has gotten worse. You may need ambulatory pulmonary artery pressure monitoring if you have shortness of breath after activity and have been hospitalized for heart failure in the past year. The sensor sends pressure readings to your health care provider every day. Your health care provider will use these readings to adjust your medicines or change  your treatment plan as needed. This  monitoring may improve your treatment and reduce the need to be hospitalized. Tell a health care provider about:  Any allergies you have.  All medicines you are taking, including vitamins, herbs, eye drops, creams, and over-the-counter medicines.  Any problems you or family members have had with anesthetic medicines.  Any blood disorders you have.  Any surgeries you have.  Any medical conditions you have.  Whether you are pregnant or may be pregnant. What are the risks? Generally, this is a safe procedure and treatment. However, problems may occur, including:  Infection.  Bleeding.  Allergic reactions to medicines or dyes.  Damage to other structures or organs.  Abnormal heart rhythm.  Problems with the sensor or movement of the sensor.  A blood clot that forms in a vein and travels to your lungs or brain. What happens before the procedure?  Ask your health care provider about: ? Changing or stopping your regular medicines. This is especially important if you are taking diabetes medicines or blood thinners. ? Taking medicines such as aspirin and ibuprofen. These medicines can thin your blood. Check with your health care provider to see if you should take them. ? Taking over-the-counter medicines, vitamins, herbs, and supplements.  Follow instructions from your health care provider about eating or drinking restrictions.  Do not use any products that contain nicotine or tobacco, such as cigarettes and e-cigarettes. If you need help quitting, ask your health care provider.  Ask your health care provider what steps will be taken to help prevent infection. These may include: ? Removing hair at the procedure site. ? Washing skin with a germ-killing soap.  Plan to have someone take you home from the hospital or clinic. What happens during the procedure?   An IV will be inserted into one of your veins.  You will be given: ? A medicine to help you relax (sedative). ? A  medicine to numb an area in your groin or neck (local anesthetic).  A small incision will be made over a blood vessel that leads back to your heart (vein).  A long, flexible tube (catheter) with the sensor attached at the end will be inserted into the vein. Next, it will be advanced into the right side of your heart and then to the pulmonary artery.  Imaging studies will be done to check the position of the sensor.  The sensor will be attached inside your pulmonary artery.  The catheter will be removed, and pressure will be placed over the incision to prevent bleeding and bruising.  A dressing (bandage) will be placed over the incision. The procedure may vary among health care providers and hospitals. What can I expect after the procedure?  Your blood pressure, heart rate, breathing rate, and blood oxygen level will be monitored until you leave the hospital or clinic.  You will also be monitored to make sure you do not have any abnormal heart rhythms. Follow these instructions at home: Sensor information  Follow instructions from your health care provider about sending information from the sensor. You may be instructed to send stored information about your pulmonary artery pressure every night using your patient's electronic system. ? The electronic system is stored inside a padded pillow. Each night when you lie down to sleep, you will lie on the pillow. The electronic system will get information from the sensor and send it to your health care provider. ? If you need a change in your medicine, your health  care provider's office will contact you.  If you need to have an MRI, be sure to tell your health care providers that you have an ambulatory pulmonary artery sensor.  Your sensor will not set off metal detectors at airports or stores. Incision care  Follow instructions from your health care provider about how to take care of your incision. Make sure you: ? Wash your hands with soap  and water before you change your bandage (dressing). If soap and water are not available, use hand sanitizer. ? Change your dressing as told by your health care provider.  Do not take baths, swim, or use a hot tub until your health care provider approves. Ask your health care provider if you may take showers.  Check your incision area every day for signs of infection. Check for: ? Redness, swelling, or pain. ? Fluid or blood. ? Warmth. ? Pus or a bad smell. General instructions  Take over-the-counter and prescription medicines only as told by your health care provider.  Return to your normal activities as told by your health care provider. Ask your health care provider what activities are safe for you.  Do not use any products that contain nicotine or tobacco, such as cigarettes and e-cigarettes. If you need help quitting, ask your health care provider.  Keep all follow-up visits as told by your health care provider. This is important. Contact a health care provider if:  You have: ? A fever or chills. ? Pain near your incision. ? Any bleeding or signs of infection.  You feel: ? Like your heart is beating too fast or skipping beats. ? Short of breath or dizzy. Get help right away if:  You have chest pain or difficulty breathing. Summary  Ambulatory pulmonary artery pressure monitoring is a way to help your health care provider treat your heart failure. It involves having a procedure to place (implant) a permanent pressure sensor inside your pulmonary artery.  To place the sensor in your artery, your health care provider will use a catheter that moves the sensor through a vein that goes to your heart.  This monitoring may improve your treatment and reduce the need to be hospitalized for your heart condition.  You will learn how to use an electronic device to send pressure readings to your health care provider. This information is not intended to replace advice given to you by  your health care provider. Make sure you discuss any questions you have with your health care provider. Document Released: 01/21/2018 Document Revised: 01/21/2018 Document Reviewed: 01/21/2018 Elsevier Interactive Patient Education  2019 [employer].   Biventricular Pacemaker Implantation A biventricular pacemaker implantation is a procedure to place (implant) a pacemaker into both of the lower chambers (ventricles) of the heart. A pacemaker is a small, battery-powered device that helps control the heartbeat. If the heart beats irregularly or too slowly (bradycardia), the pacemaker will pace the heart so that it beats at a normal rate or a programmed rate. The parts of a biventricular pacemaker include:  The pulse generator. The pulse generator contains a small computer and a memory system that is programmed to keep the heart beating at a certain rate. The pulse generator also produces the electrical signal that triggers the heart to beat. This is implanted under the skin of the upper chest, near the collarbone.  Wires (leads). The leads are placed in the left and right ventricles of the heart. The leads are connected to the pulse generator. They transmit electrical pulses  from the pulse generator to the heart. This procedure may be done to treat:  Bradycardia.  Symptoms of severe heart failure, such as shortness of breath (dyspnea).  Loss of consciousness that happens repeatedly (syncope) because of an irregular heart rate. Tell a health care provider about:  Any allergies you have.  All medicines you are taking, including vitamins, herbs, eye drops, creams, and over-the-counter medicines.  Any problems you or family members have had with anesthetic medicines.  Any blood disorders you have.  Any surgeries you have had.  Any medical conditions you have.  Whether you are pregnant or may be pregnant. What are the risks? Generally, this is a safe procedure. However, problems may  occur, including:  Infection.  Bleeding.  Allergic reactions to medicines or dyes.  Damage to other structures or organs, such as your blood vessels, lungs, or heart.  Failure of the pacemaker to improve your condition. What happens before the procedure?  Ask your health care provider about: ? Changing or stopping your regular medicines. This is especially important if you are taking diabetes medicines or blood thinners. ? Taking medicines such as aspirin and ibuprofen. These medicines can thin your blood. Do not take these medicines before your procedure if your health care provider instructs you not to.  Follow instructions from your health care provider about eating or drinking restrictions.  Do not use any tobacco products for at least 24 hours before your procedure. This includes cigarettes, chewing tobacco, or e-cigarettes.  Ask your health care provider how your surgical site will be marked or identified.  You may be given antibiotic medicine to help prevent infection.  You may have tests, including: ? Blood tests. ? Chest X-rays.  Plan to have someone take you home after the procedure.  If you go home right after the procedure, plan to have someone with you for 24 hours. What happens during the procedure?  To reduce your risk of infection: ? Your health care team will wash or sanitize their hands. ? Your skin will be washed with soap. ? Hair may be removed from your surgical area.  An IV tube will be inserted into one of your veins.  You will be given one or more of the following: ? A medicine to help you relax (sedative). ? A medicine to make you fall asleep (general anesthetic). ? A medicine that is injected into your spine to numb the area below and slightly above the injection site (spinal anesthetic). ? A medicine that is injected into an area of your body to numb everything below the injection site (regional anesthetic).  An incision will be made in your  upper chest, near your heart.  The leads will be guided into your incision, through your blood vessels, and into your ventricles. Your surgeon will use an X-ray machine (fluoroscope) to guide the leads into your heart.  The leads will be attached to your heart muscles and to the pulse generator.  The leads will be tested to make sure that they work correctly.  The pulse generator will be implanted under your skin, near your incision.  Your incision will be closed with stitches (sutures), skin glue, or adhesive tape.  A bandage (dressing) will be placed over your incision. The procedure may vary among health care providers and hospitals. What happens after the procedure?  Your blood pressure, heart rate, breathing rate, and blood oxygen level will be monitored often until the medicines you were given have worn off.  You  may continue to receive fluids and medicines through an IV tube.  You will have some pain. Pain medicines will be available to help you.  You will have a chest X-ray done. This is to make sure that your pacemaker is in the right place.  You may have to wear compression stockings. These stockings help to prevent blood clots and reduce swelling in your legs.  You will be given a pacemaker identification card. This card lists the implant date, device model, and manufacturer of your pacemaker.  Do not drive for 24 hours if you received a sedative. This information is not intended to replace advice given to you by your health care provider. Make sure you discuss any questions you have with your health care provider. Document Released: 07/02/2012 Document Revised: 05/26/2018 Document Reviewed: 07/03/2015 Elsevier Interactive Patient Education  2019 [employer]   Ambulatory Pulmonary Artery Pressure Monitoring  Ambulatory pulmonary artery pressure monitoring is a way to help your health care provider treat your heart failure. It involves having a procedure to place  (implant) a permanent pressure sensor inside your pulmonary artery. Your pulmonary artery carries blood from the right side of your heart to your lungs. Increased pressure in this artery is an early sign that your heart failure has gotten worse. You may need ambulatory pulmonary artery pressure monitoring if you have shortness of breath after activity and have been hospitalized for heart failure in the past year. The sensor sends pressure readings to your health care provider every day. Your health care provider will use these readings to adjust your medicines or change your treatment plan as needed. This monitoring may improve your treatment and reduce the need to be hospitalized. Tell a health care provider about:  Any allergies you have.  All medicines you are taking, including vitamins, herbs, eye drops, creams, and over-the-counter medicines.  Any problems you or family members have had with anesthetic medicines.  Any blood disorders you have.  Any surgeries you have.  Any medical conditions you have.  Whether you are pregnant or may be pregnant. What are the risks? Generally, this is a safe procedure and treatment. However, problems may occur, including:  Infection.  Bleeding.  Allergic reactions to medicines or dyes.  Damage to other structures or organs.  Abnormal heart rhythm.  Problems with the sensor or movement of the sensor.  A blood clot that forms in a vein and travels to your lungs or brain. What happens before the procedure?  Ask your health care provider about: ? Changing or stopping your regular medicines. This is especially important if you are taking diabetes medicines or blood thinners. ? Taking medicines such as aspirin and ibuprofen. These medicines can thin your blood. Check with your health care provider to see if you should take them. ? Taking over-the-counter medicines, vitamins, herbs, and supplements.  Follow instructions from your health care  provider about eating or drinking restrictions.  Do not use any products that contain nicotine or tobacco, such as cigarettes and e-cigarettes. If you need help quitting, ask your health care provider.  Ask your health care provider what steps will be taken to help prevent infection. These may include: ? Removing hair at the procedure site. ? Washing skin with a germ-killing soap.  Plan to have someone take you home from the hospital or clinic. What happens during the procedure?   An IV will be inserted into one of your veins.  You will be given: ? A medicine to help  you relax (sedative). ? A medicine to numb an area in your groin or neck (local anesthetic).  A small incision will be made over a blood vessel that leads back to your heart (vein).  A long, flexible tube (catheter) with the sensor attached at the end will be inserted into the vein. Next, it will be advanced into the right side of your heart and then to the pulmonary artery.  Imaging studies will be done to check the position of the sensor.  The sensor will be attached inside your pulmonary artery.  The catheter will be removed, and pressure will be placed over the incision to prevent bleeding and bruising.  A dressing (bandage) will be placed over the incision. The procedure may vary among health care providers and hospitals. What can I expect after the procedure?  Your blood pressure, heart rate, breathing rate, and blood oxygen level will be monitored until you leave the hospital or clinic.  You will also be monitored to make sure you do not have any abnormal heart rhythms. Follow these instructions at home: Sensor information  Follow instructions from your health care provider about sending information from the sensor. You may be instructed to send stored information about your pulmonary artery pressure every night using your patient's electronic system. ? The electronic system is stored inside a padded pillow.  Each night when you lie down to sleep, you will lie on the pillow. The electronic system will get information from the sensor and send it to your health care provider. ? If you need a change in your medicine, your health care provider's office will contact you.  If you need to have an MRI, be sure to tell your health care providers that you have an ambulatory pulmonary artery sensor.  Your sensor will not set off metal detectors at airports or stores. Incision care  Follow instructions from your health care provider about how to take care of your incision. Make sure you: ? Wash your hands with soap and water before you change your bandage (dressing). If soap and water are not available, use hand sanitizer. ? Change your dressing as told by your health care provider.  Do not take baths, swim, or use a hot tub until your health care provider approves. Ask your health care provider if you may take showers.  Check your incision area every day for signs of infection. Check for: ? Redness, swelling, or pain. ? Fluid or blood. ? Warmth. ? Pus or a bad smell. General instructions  Take over-the-counter and prescription medicines only as told by your health care provider.  Return to your normal activities as told by your health care provider. Ask your health care provider what activities are safe for you.  Do not use any products that contain nicotine or tobacco, such as cigarettes and e-cigarettes. If you need help quitting, ask your health care provider.  Keep all follow-up visits as told by your health care provider. This is important. Contact a health care provider if:  You have: ? A fever or chills. ? Pain near your incision. ? Any bleeding or signs of infection.  You feel: ? Like your heart is beating too fast or skipping beats. ? Short of breath or dizzy. Get help right away if:  You have chest pain or difficulty breathing. Summary  Ambulatory pulmonary artery pressure  monitoring is a way to help your health care provider treat your heart failure. It involves having a procedure to place (implant) a  permanent pressure sensor inside your pulmonary artery.  To place the sensor in your artery, your health care provider will use a catheter that moves the sensor through a vein that goes to your heart.  This monitoring may improve your treatment and reduce the need to be hospitalized for your heart condition.  You will learn how to use an electronic device to send pressure readings to your health care provider. This information is not intended to replace advice given to you by your health care provider. Make sure you discuss any questions you have with your health care provider. Document Released: 01/21/2018 Document Revised: 01/21/2018 Document Reviewed: 01/21/2018 Elsevier Interactive Patient Education  2019 [employer].

## 2018-12-21 NOTE — Discharge Summary (Signed)
Physician Discharge Summary  [name] WLN:[id number] DOB: 18-Jul-1924 DOA: 12/20/2018  PCP: Lucianne Lei, MD  Admit date: 12/20/2018 Discharge date: 12/21/2018  Admitted From: Home Disposition:  Home  Recommendations for Outpatient Follow-up:  1. Follow up with PCP in 1-2 weeks 2. Recommend repeat bmet in one week  Home Health:PT OT   Discharge Condition:Improved CODE STATUS:Full Diet recommendation: Heart healthy   Brief/Interim Summary: 85 y.o. male with medical history significant of diastolic CHF, hx colon cancer, HTN, prostate cancer who presents to the ED with increased sob and dyspnea. Patient is poor historian and majority of history obtained from chart review and speaking with staff. Pt was recently discharged for acute diastolic CHF two weeks prior to ED visit, after responding well to IV lasix. Pt was discharged with limited quantity of PO lasix with instruction to f/u with PCP to determined whether to continue. During this time, pt reports increased sob and decreased exercise tolerance, ultimately prompting ED visit. Pt is not aware of swelling, denies chest pain.   ED Course: In the ED, pt noted to have BNP in excess of 300 with vascular congestion noted on CXR. Pt was given one dose of IV lasix with no significant change. On trial of ambulation, pt noted to have increased dyspnea and fatigue thus hospitaliist service consulted for consideration for medical admission.  Discharge Diagnoses:  Principal Problem:   Diastolic CHF ([place]) Active Problems:   Essential hypertension   Personal history of colon cancer, stage II   Acute CHF (congestive heart failure) ([place])   1. Acutely decompensated chronic diastolic CHF 1. Recently admitted for acute CHF improved with IV lasix 2. Pt was discharged on limited quantity of po lasix, has not been taking since 3. On exam, mild non-pitting edema noted. CXR personally reviewed with findings of vascular congestion, albeit improved from  CXR at prior admit. In ED, unable to stand for prolonged time before having increased dyspnea 4. Improved with IV lasix given 5. Cr had trended up, and patient reported feeling markedly improved the following day 6. Tolerated PT well on room air 7. Will discharge home with one month supply of oral lasix 2. HTN 1. BP mildly elevated 2. Cont home meds as tolerated 3. Hx colon cancer 1. Stable at this time 4. CKD 3 1. Presenting Cr of 2 which seems to be near baseline on chart review 2. Cont Lasix as tolerated 3. Recommend repeat BMET in one week   Discharge Instructions   Allergies as of 12/21/2018   No Known Allergies     Medication List    STOP taking these medications   cephALEXin 500 MG capsule Commonly known as:  KEFLEX     TAKE these medications   amLODipine 10 MG tablet Commonly known as:  NORVASC Take 10 mg by mouth daily.   aspirin EC 81 MG tablet Take 1 tablet (81 mg total) by mouth daily.   citalopram 10 MG tablet Commonly known as:  CELEXA Take 10 mg by mouth daily.   ENTRESTO 24-26 MG Generic drug:  sacubitril-valsartan Take 1 tablet by mouth 2 (two) times daily.   furosemide 40 MG tablet Commonly known as:  LASIX Take 1 tablet (40 mg total) by mouth daily for 30 days. Start taking on:  December 22, 2018      Follow-up Information    Lucianne Lei, MD. Schedule an appointment as soon as possible for a visit in 1 week(s).   Specialty:  Family Medicine Contact information: [address] [place] [address] [phone]          No Known Allergies  Procedures/Studies: Dg Chest 2 View  Result Date: 12/20/2018 CLINICAL DATA:  Initial evaluation for acute shortness of breath. EXAM: CHEST - 2 VIEW COMPARISON:  Prior radiograph from 10/23/2017 FINDINGS: Mild cardiomegaly, stable. Mediastinal silhouette within normal limits. Lungs are hypoinflated. Perihilar vascular congestion without frank pulmonary edema. Small right pleural effusion.  Superimposed bibasilar atelectatic changes. No consolidative opacity. No pneumothorax. No acute osseous abnormality. IMPRESSION: 1. Stable cardiomegaly with mild perihilar vascular congestion without overt pulmonary edema. 2. Small right pleural effusion. 3. Low lung volumes with superimposed bibasilar atelectatic changes. Electronically Signed   By: Jeannine Boga M.D.   On: 12/20/2018 04:53    Subjective: Feeling much better. Eager to go home  Discharge Exam: Vitals:   12/21/18 1322 12/21/18 1353  BP:  (!) 140/49  Pulse:  65  Resp:  (!) 22  Temp:  98 F (36.7 C)  SpO2: 99% 100%   Vitals:   12/21/18 0700 12/21/18 1141 12/21/18 1322 12/21/18 1353  BP: (!) 141/47 (!) 146/51  (!) 140/49  Pulse: 63   65  Resp: 18   (!) 22  Temp: 98.1 F (36.7 C)   98 F (36.7 C)  TempSrc: Oral   Oral  SpO2: 96%  99% 100%  Weight: 81.7 kg     Height:        General: Pt is alert, awake, not in acute distress Cardiovascular: RRR, S1/S2 +, no rubs, no gallops Respiratory: CTA bilaterally, no wheezing, no rhonchi Abdominal: Soft, NT, ND, bowel sounds + Extremities: no edema, no cyanosis   The results of significant diagnostics from this hospitalization (including imaging, microbiology, ancillary and laboratory) are listed below for reference.     Microbiology: No results found for this or any previous visit (from the past 240 hour(s)).   Labs: BNP (last 3 results) Recent Labs    12/20/18 0450  BNP 371.6*   Basic Metabolic Panel: Recent Labs  Lab 12/20/18 0450 12/20/18 0519 12/21/18 0520  NA 142  --  143  K 3.2*  --  3.7  CL 110  --  110  CO2 23  --  26  GLUCOSE 124*  --  74  BUN 28*  --  38*  CREATININE 2.04* 2.03* 2.38*  CALCIUM 8.9  --  8.8*   Liver Function Tests: Recent Labs  Lab 12/20/18 0450 12/21/18 0520  AST 24 21  ALT 12 12  ALKPHOS 94 90  BILITOT 0.8 0.9  PROT 6.9 6.7  ALBUMIN 3.4* 3.2*   No results for input(s): LIPASE, AMYLASE in the last 168  hours. No results for input(s): AMMONIA in the last 168 hours. CBC: Recent Labs  Lab 12/20/18 0450 12/21/18 0520  WBC 5.7 5.2  NEUTROABS 3.8  --   HGB 10.3* 10.1*  HCT 33.0* 32.4*  MCV 87.1 86.4  PLT 195 185   Cardiac Enzymes: Recent Labs  Lab 12/20/18 0450  TROPONINI <0.03   BNP: Invalid input(s): POCBNP CBG: No results for input(s): GLUCAP in the last 168 hours. D-Dimer No results for input(s): DDIMER in the last 72 hours. Hgb A1c No results for input(s): HGBA1C in the last 72 hours. Lipid Profile No results for input(s): CHOL, HDL, LDLCALC, TRIG, CHOLHDL, LDLDIRECT in the last 72 hours. Thyroid function studies No results for input(s): TSH, T4TOTAL, T3FREE, THYROIDAB in the last 72 hours.  Invalid input(s): FREET3 Anemia work  up No results for input(s): VITAMINB12, FOLATE, FERRITIN, TIBC, IRON, RETICCTPCT in the last 72 hours. Urinalysis    Component Value Date/Time   COLORURINE RED (A) 11/30/2018 0327   APPEARANCEUR TURBID (A) 11/30/2018 0327   LABSPEC 1.015 11/30/2018 0327   PHURINE 6.5 11/30/2018 0327   GLUCOSEU NEGATIVE 11/30/2018 0327   HGBUR LARGE (A) 11/30/2018 0327   BILIRUBINUR NEGATIVE 11/30/2018 0327   KETONESUR NEGATIVE 11/30/2018 0327   PROTEINUR >300 (A) 11/30/2018 0327   UROBILINOGEN 1.0 04/07/2010 1747   NITRITE POSITIVE (A) 11/30/2018 0327   LEUKOCYTESUR TRACE (A) 11/30/2018 0327   Sepsis Labs Invalid input(s): PROCALCITONIN,  WBC,  LACTICIDVEN Microbiology No results found for this or any previous visit (from the past 240 hour(s)).  Time spent: 30 min  SIGNED:   Marylu Lund, MD  [facility] 12/21/2018, 3:07 PM  If 7PM-7AM, please contact night-coverage

## 2018-12-21 NOTE — Progress Notes (Signed)
Occupational Therapy Evaluation Patient Details Name: [name] MRN: [id number] DOB: Apr 02, 1924 Today's Date: 12/21/2018    History of Present Illness 85 year old male admitted with increasing SOB and DOE. Dx: diastolic CHF. PMH: diastolic CHF, HTN, prostate cancer, colon cancer   Clinical Impression   Patient presents to OT with decreased ADL independence and safety due to the deficits listed below. He will benefit from skilled OT to maximize function and to facilitate a safe discharge. OT will follow.    Follow Up Recommendations  Home health OT;Supervision/Assistance - 24 hour    Equipment Recommendations  None recommended by OT    Recommendations for Other Services PT consult     Precautions / Restrictions Precautions Precautions: Fall Precaution Comments: pt reports 3 falls in the last 2 months Restrictions Weight Bearing Restrictions: No      Mobility Bed Mobility Overal bed mobility: Needs Assistance Bed Mobility: Supine to Sit;Sit to Supine     Supine to sit: Min guard Sit to supine: Min guard      Transfers Overall transfer level: Needs assistance Equipment used: 1 person hand held assist Transfers: Sit to/from [facility] Sit to Stand: Min assist;From elevated surface Stand pivot transfers: Min assist;From elevated surface       General transfer comment: pt initially unsteady    Balance                                           ADL either performed or assessed with clinical judgement   ADL Overall ADL's : Needs assistance/impaired Eating/Feeding: Independent;Sitting   Grooming: Wash/dry hands;Wash/dry face;Set up;Sitting               Lower Body Dressing: Minimal assistance Lower Body Dressing Details (indicate cue type and reason): able to reach down to feet to adjust socks Toilet Transfer: Minimal assistance;BSC Toilet Transfer Details (indicate cue type and reason): simulation: sit to stand from  bed, side and pivotal steps practiced Toileting- Clothing Manipulation and Hygiene: (currently has condom cath)     Tub/Shower Transfer Details (indicate cue type and reason): pt reports he has a tub seat at home Functional mobility during ADLs: Minimal assistance General ADL Comments: Patient with 2 coughing attacks during session which limited him.     Vision         Perception     Praxis      Pertinent Vitals/Pain       Hand Dominance Right   Extremity/Trunk Assessment             Communication Communication Communication: No difficulties   Cognition Arousal/Alertness: Awake/alert Behavior During Therapy: WFL for tasks assessed/performed Overall Cognitive Status: Within Functional Limits for tasks assessed                                 General Comments: alert, oriented to place, "March", "2020" (got day wrong)   General Comments       Exercises     Shoulder Instructions      Home Living Family/patient expects to be discharged to:: Private residence Living Arrangements: Children                               Additional Comments: Daughters assist him daily; has cane, RW,  tub seat. Reports independence with BADLs; daughters assist with meals and transportation      Prior Functioning/Environment Level of Independence: Independent with assistive device(s)        Comments: pt reports he uses cane vs RW to get around        OT Problem List: Decreased activity tolerance;Cardiopulmonary status limiting activity      OT Treatment/Interventions: Self-care/ADL training;DME and/or AE instruction;Patient/family education;Therapeutic activities    OT Goals(Current goals can be found in the care plan section) Acute Rehab OT Goals Patient Stated Goal: none stated OT Goal Formulation: With patient Time For Goal Achievement: 01/04/19 Potential to Achieve Goals: Good  OT Frequency: Min 2X/week   Barriers to D/C:             Co-evaluation              AM-PAC OT "6 Clicks" Daily Activity     Outcome Measure Help from another person eating meals?: None Help from another person taking care of personal grooming?: A Little Help from another person toileting, which includes using toliet, bedpan, or urinal?: A Little Help from another person bathing (including washing, rinsing, drying)?: A Little Help from another person to put on and taking off regular upper body clothing?: A Little Help from another person to put on and taking off regular lower body clothing?: A Little 6 Click Score: 19   End of Session Nurse Communication: Mobility status  Activity Tolerance: Patient tolerated treatment well Patient left: in bed;with call bell/phone within reach;with bed alarm set  OT Visit Diagnosis: Unsteadiness on feet (R26.81)                Time: 8657-8469 OT Time Calculation (min): 15 min Charges:  OT General Charges $OT Visit: 1 Visit OT Evaluation $OT Eval Low Complexity: 1 Low   Calyn Sivils A Hendrik Donath 12/21/2018, 10:42 AM

## 2018-12-21 NOTE — Evaluation (Signed)
Physical Therapy Evaluation Patient Details Name: [name] MRN: [id number] DOB: 07-13-1924 Today's Date: 12/21/2018   History of Present Illness  85 year old male admitted with increasing SOB and DOE. Dx: diastolic CHF. PMH: diastolic CHF, HTN, prostate cancer, colon cancer  Clinical Impression  Pt with generalized weakness ( most weakness in R DF and L hand grip), had a little difficulty with first walk 30 feet, however after seated rest break for about 2 minutes , pt improved greatly with ambulation with RW greater that  60 feet with improved ease and step over step gait pattern. Definitely recommend RW for home and HHPT to contiue. PT states his RW is old and not working well at home, please look into if pt can get new RW . Will continue to follow while on acute care.       Follow Up Recommendations Home health PT(pt stated he may already be recieving HHPT ( to ask his dtr) )    Equipment Recommendations  Rolling walker with 5" wheels(pt states his is very old and not working well )    Recommendations for Other Services       Precautions / Restrictions Precautions Precautions: Fall Precaution Comments: pt reports 3 falls in the last 2 months Restrictions Weight Bearing Restrictions: No      Mobility  Bed Mobility Overal bed mobility: Needs Assistance Bed Mobility: Supine to Sit;Sit to Supine     Supine to sit: Min guard Sit to supine: Min guard      Transfers Overall transfer level: Needs assistance Equipment used: Rolling walker (2 wheeled) Transfers: Sit to/from [facility] Sit to Stand: Min assist;From elevated surface Stand pivot transfers: Min assist;From elevated surface       General transfer comment: pt initially unsteady  Ambulation/Gait Ambulation/Gait assistance: Min assist;+2 safety/equipment Gait Distance (Feet): 60 Feet Assistive device: Rolling walker (2 wheeled) Gait Pattern/deviations: Step-through pattern     General  Gait Details: intitally went about 30 feet with difficulties and pt stating his " LLE was wanting to give way" so we sat for a rest break and then began to walk again with much improvement and the LLE wasa not giving way. Pt stated he just had to get things going.   Stairs            Wheelchair Mobility    Modified Rankin (Stroke Patients Only)       Balance Overall balance assessment: Needs assistance Sitting-balance support: Feet supported Sitting balance-Leahy Scale: Good     Standing balance support: Bilateral upper extremity supported;During functional activity Standing balance-Leahy Scale: Fair Standing balance comment: utltized RW and needed to rest due to BLE intially felt weak and he had great dyspnea during first walk                              Pertinent Vitals/Pain Pain Assessment: No/denies pain    Home Living Family/patient expects to be discharged to:: Private residence Living Arrangements: Children Available Help at Discharge: Available 24 hours/day(pt states someone is always with him ) Type of Home: House Home Access: Level entry     Home Layout: One level Home Equipment: Cane - single point Additional Comments: Daughters assist him daily; has cane, RW ( old and not the correct height  will check to see if he can get a new one)  , tub seat. Reports independence with BADLs; daughters assist with meals and transportation  Prior Function Level of Independence: Independent with assistive device(s)         Comments: pt reports he uses cane vs RW to get around. RW is old and not working well so will recommend CM to look into a new RW for him      Hand Dominance   Dominant Hand: Right    Extremity/Trunk Assessment        Lower Extremity Assessment Lower Extremity Assessment: Generalized weakness;RLE deficits/detail RLE Deficits / Details: overall B Le 4+/5 except for R DF 3-/ 5 ( not full range AROM , but PROM still WNL)b         Communication   Communication: No difficulties  Cognition Arousal/Alertness: Awake/alert Behavior During Therapy: WFL for tasks assessed/performed Overall Cognitive Status: Within Functional Limits for tasks assessed                                 General Comments: alert, oriented to place, "March", "2020" (got day wrong)      General Comments      Exercises     Assessment/Plan    PT Assessment Patient needs continued PT services  PT Problem List Decreased strength;Decreased activity tolerance;Decreased mobility       PT Treatment Interventions Gait training;Functional mobility training;Therapeutic exercise    PT Goals (Current goals can be found in the Care Plan section)  Acute Rehab PT Goals Patient Stated Goal: none stated  PT Goal Formulation: With patient Time For Goal Achievement: 01/04/19 Potential to Achieve Goals: Good    Frequency Min 3X/week   Barriers to discharge        Co-evaluation               AM-PAC PT "6 Clicks" Mobility  Outcome Measure Help needed turning from your back to your side while in a flat bed without using bedrails?: A Little Help needed moving from lying on your back to sitting on the side of a flat bed without using bedrails?: A Little Help needed moving to and from a bed to a chair (including a wheelchair)?: A Little Help needed standing up from a chair using your arms (e.g., wheelchair or bedside chair)?: A Little Help needed to walk in hospital room?: A Little Help needed climbing 3-5 steps with a railing? : A Little 6 Click Score: 18    End of Session Equipment Utilized During Treatment: Gait belt Activity Tolerance: Patient tolerated treatment well Patient left: in chair;with call bell/phone within reach Nurse Communication: Mobility status PT Visit Diagnosis: Unsteadiness on feet (R26.81);Muscle weakness (generalized) (M62.81);Repeated falls (R29.6)    Time: 2957-4734 PT Time Calculation (min)  (ACUTE ONLY): 33 min   Charges:   PT Evaluation $PT Eval Moderate Complexity: 1 Mod PT Treatments $Gait Training: 8-22 mins        Clide Dales, PT Acute Rehabilitation Services Pager: [phone] Office: [phone] 12/21/2018   Clide Dales 12/21/2018, 1:35 PM

## 2018-12-21 NOTE — Care Management Obs Status (Signed)
[place] NOTIFICATION   Patient Details  Name: Brennden Masten MRN: [id number] Date of Birth: 1923/10/28   Medicare Observation Status Notification Given:  Yes    Erenest Rasher, RN 12/21/2018, 11:57 AM

## 2018-12-22 NOTE — Care Management Note (Signed)
Case Management Note  Patient Details  Name: [name] MRN: [id number] Date of Birth: June 27, 1924  Subjective/Objective:   Admitted with CHF                 Action/Plan: Late entry 12/22/2018- Pt was dc home late with [facility] on 12/21/2018. Contacted pt and dtr answered phone. States he is active with [facility]. She will get a Rx from PCP for a Rollator. Explained she can take Rx to [facility] retail store to pick up. Contacted [facility] to make aware of dc home. Spoke to rep and pt is active with HH.   Expected Discharge Date:  12/21/18               Expected Discharge Plan:  [place]  In-House Referral:  NA  Discharge planning Services  CM Consult  Post Acute Care Choice:  Home Health Choice offered to:  Adult Children  DME Arranged:  N/A DME Agency:  NA  HH Arranged:  RN, PT HH Agency:  [facility]  Status of Service:  Completed, signed off  If discussed at [place] of Stay Meetings, dates discussed:    Additional Comments:  Erenest Rasher, RN 12/22/2018, 3:33 PM

## 2018-12-25 DIAGNOSIS — I5033 Acute on chronic diastolic (congestive) heart failure: Secondary | ICD-10-CM | POA: Diagnosis not present

## 2018-12-25 DIAGNOSIS — Z9181 History of falling: Secondary | ICD-10-CM | POA: Diagnosis not present

## 2018-12-25 DIAGNOSIS — G5603 Carpal tunnel syndrome, bilateral upper limbs: Secondary | ICD-10-CM | POA: Diagnosis not present

## 2018-12-25 DIAGNOSIS — I130 Hypertensive heart and chronic kidney disease with heart failure and stage 1 through stage 4 chronic kidney disease, or unspecified chronic kidney disease: Secondary | ICD-10-CM | POA: Diagnosis not present

## 2018-12-25 DIAGNOSIS — N183 Chronic kidney disease, stage 3 (moderate): Secondary | ICD-10-CM | POA: Diagnosis not present

## 2018-12-25 DIAGNOSIS — E1122 Type 2 diabetes mellitus with diabetic chronic kidney disease: Secondary | ICD-10-CM | POA: Diagnosis not present

## 2018-12-25 DIAGNOSIS — Z85038 Personal history of other malignant neoplasm of large intestine: Secondary | ICD-10-CM | POA: Diagnosis not present

## 2018-12-25 DIAGNOSIS — R42 Dizziness and giddiness: Secondary | ICD-10-CM | POA: Diagnosis not present

## 2018-12-25 DIAGNOSIS — Z87891 Personal history of nicotine dependence: Secondary | ICD-10-CM | POA: Diagnosis not present

## 2018-12-26 DIAGNOSIS — R310 Gross hematuria: Secondary | ICD-10-CM | POA: Diagnosis not present

## 2018-12-30 DIAGNOSIS — G5603 Carpal tunnel syndrome, bilateral upper limbs: Secondary | ICD-10-CM | POA: Diagnosis not present

## 2018-12-30 DIAGNOSIS — R42 Dizziness and giddiness: Secondary | ICD-10-CM | POA: Diagnosis not present

## 2018-12-30 DIAGNOSIS — I5033 Acute on chronic diastolic (congestive) heart failure: Secondary | ICD-10-CM | POA: Diagnosis not present

## 2018-12-30 DIAGNOSIS — Z85038 Personal history of other malignant neoplasm of large intestine: Secondary | ICD-10-CM | POA: Diagnosis not present

## 2018-12-30 DIAGNOSIS — E1122 Type 2 diabetes mellitus with diabetic chronic kidney disease: Secondary | ICD-10-CM | POA: Diagnosis not present

## 2018-12-30 DIAGNOSIS — Z9181 History of falling: Secondary | ICD-10-CM | POA: Diagnosis not present

## 2018-12-30 DIAGNOSIS — Z87891 Personal history of nicotine dependence: Secondary | ICD-10-CM | POA: Diagnosis not present

## 2018-12-30 DIAGNOSIS — N183 Chronic kidney disease, stage 3 (moderate): Secondary | ICD-10-CM | POA: Diagnosis not present

## 2018-12-30 DIAGNOSIS — I130 Hypertensive heart and chronic kidney disease with heart failure and stage 1 through stage 4 chronic kidney disease, or unspecified chronic kidney disease: Secondary | ICD-10-CM | POA: Diagnosis not present

## 2019-01-01 DIAGNOSIS — E1122 Type 2 diabetes mellitus with diabetic chronic kidney disease: Secondary | ICD-10-CM | POA: Diagnosis not present

## 2019-01-01 DIAGNOSIS — I130 Hypertensive heart and chronic kidney disease with heart failure and stage 1 through stage 4 chronic kidney disease, or unspecified chronic kidney disease: Secondary | ICD-10-CM | POA: Diagnosis not present

## 2019-01-01 DIAGNOSIS — N183 Chronic kidney disease, stage 3 (moderate): Secondary | ICD-10-CM | POA: Diagnosis not present

## 2019-01-01 DIAGNOSIS — G5603 Carpal tunnel syndrome, bilateral upper limbs: Secondary | ICD-10-CM | POA: Diagnosis not present

## 2019-01-01 DIAGNOSIS — R42 Dizziness and giddiness: Secondary | ICD-10-CM | POA: Diagnosis not present

## 2019-01-01 DIAGNOSIS — Z87891 Personal history of nicotine dependence: Secondary | ICD-10-CM | POA: Diagnosis not present

## 2019-01-01 DIAGNOSIS — Z9181 History of falling: Secondary | ICD-10-CM | POA: Diagnosis not present

## 2019-01-01 DIAGNOSIS — Z85038 Personal history of other malignant neoplasm of large intestine: Secondary | ICD-10-CM | POA: Diagnosis not present

## 2019-01-01 DIAGNOSIS — I5033 Acute on chronic diastolic (congestive) heart failure: Secondary | ICD-10-CM | POA: Diagnosis not present

## 2019-01-02 DIAGNOSIS — G5603 Carpal tunnel syndrome, bilateral upper limbs: Secondary | ICD-10-CM | POA: Diagnosis not present

## 2019-01-02 DIAGNOSIS — Z9181 History of falling: Secondary | ICD-10-CM | POA: Diagnosis not present

## 2019-01-02 DIAGNOSIS — R42 Dizziness and giddiness: Secondary | ICD-10-CM | POA: Diagnosis not present

## 2019-01-02 DIAGNOSIS — I130 Hypertensive heart and chronic kidney disease with heart failure and stage 1 through stage 4 chronic kidney disease, or unspecified chronic kidney disease: Secondary | ICD-10-CM | POA: Diagnosis not present

## 2019-01-02 DIAGNOSIS — E1122 Type 2 diabetes mellitus with diabetic chronic kidney disease: Secondary | ICD-10-CM | POA: Diagnosis not present

## 2019-01-02 DIAGNOSIS — Z87891 Personal history of nicotine dependence: Secondary | ICD-10-CM | POA: Diagnosis not present

## 2019-01-02 DIAGNOSIS — I5033 Acute on chronic diastolic (congestive) heart failure: Secondary | ICD-10-CM | POA: Diagnosis not present

## 2019-01-02 DIAGNOSIS — N183 Chronic kidney disease, stage 3 (moderate): Secondary | ICD-10-CM | POA: Diagnosis not present

## 2019-01-02 DIAGNOSIS — Z85038 Personal history of other malignant neoplasm of large intestine: Secondary | ICD-10-CM | POA: Diagnosis not present

## 2019-01-05 DIAGNOSIS — E1122 Type 2 diabetes mellitus with diabetic chronic kidney disease: Secondary | ICD-10-CM | POA: Diagnosis not present

## 2019-01-05 DIAGNOSIS — Z87891 Personal history of nicotine dependence: Secondary | ICD-10-CM | POA: Diagnosis not present

## 2019-01-05 DIAGNOSIS — G5603 Carpal tunnel syndrome, bilateral upper limbs: Secondary | ICD-10-CM | POA: Diagnosis not present

## 2019-01-05 DIAGNOSIS — Z85038 Personal history of other malignant neoplasm of large intestine: Secondary | ICD-10-CM | POA: Diagnosis not present

## 2019-01-05 DIAGNOSIS — N183 Chronic kidney disease, stage 3 (moderate): Secondary | ICD-10-CM | POA: Diagnosis not present

## 2019-01-05 DIAGNOSIS — I5033 Acute on chronic diastolic (congestive) heart failure: Secondary | ICD-10-CM | POA: Diagnosis not present

## 2019-01-05 DIAGNOSIS — I130 Hypertensive heart and chronic kidney disease with heart failure and stage 1 through stage 4 chronic kidney disease, or unspecified chronic kidney disease: Secondary | ICD-10-CM | POA: Diagnosis not present

## 2019-01-05 DIAGNOSIS — Z9181 History of falling: Secondary | ICD-10-CM | POA: Diagnosis not present

## 2019-01-05 DIAGNOSIS — R42 Dizziness and giddiness: Secondary | ICD-10-CM | POA: Diagnosis not present

## 2019-01-07 DIAGNOSIS — Z87891 Personal history of nicotine dependence: Secondary | ICD-10-CM | POA: Diagnosis not present

## 2019-01-07 DIAGNOSIS — G5603 Carpal tunnel syndrome, bilateral upper limbs: Secondary | ICD-10-CM | POA: Diagnosis not present

## 2019-01-07 DIAGNOSIS — I5033 Acute on chronic diastolic (congestive) heart failure: Secondary | ICD-10-CM | POA: Diagnosis not present

## 2019-01-07 DIAGNOSIS — N183 Chronic kidney disease, stage 3 (moderate): Secondary | ICD-10-CM | POA: Diagnosis not present

## 2019-01-07 DIAGNOSIS — Z9181 History of falling: Secondary | ICD-10-CM | POA: Diagnosis not present

## 2019-01-07 DIAGNOSIS — I130 Hypertensive heart and chronic kidney disease with heart failure and stage 1 through stage 4 chronic kidney disease, or unspecified chronic kidney disease: Secondary | ICD-10-CM | POA: Diagnosis not present

## 2019-01-07 DIAGNOSIS — E1122 Type 2 diabetes mellitus with diabetic chronic kidney disease: Secondary | ICD-10-CM | POA: Diagnosis not present

## 2019-01-07 DIAGNOSIS — Z85038 Personal history of other malignant neoplasm of large intestine: Secondary | ICD-10-CM | POA: Diagnosis not present

## 2019-01-07 DIAGNOSIS — R42 Dizziness and giddiness: Secondary | ICD-10-CM | POA: Diagnosis not present

## 2019-01-09 DIAGNOSIS — R42 Dizziness and giddiness: Secondary | ICD-10-CM | POA: Diagnosis not present

## 2019-01-09 DIAGNOSIS — E1122 Type 2 diabetes mellitus with diabetic chronic kidney disease: Secondary | ICD-10-CM | POA: Diagnosis not present

## 2019-01-09 DIAGNOSIS — G5603 Carpal tunnel syndrome, bilateral upper limbs: Secondary | ICD-10-CM | POA: Diagnosis not present

## 2019-01-09 DIAGNOSIS — I130 Hypertensive heart and chronic kidney disease with heart failure and stage 1 through stage 4 chronic kidney disease, or unspecified chronic kidney disease: Secondary | ICD-10-CM | POA: Diagnosis not present

## 2019-01-09 DIAGNOSIS — Z87891 Personal history of nicotine dependence: Secondary | ICD-10-CM | POA: Diagnosis not present

## 2019-01-09 DIAGNOSIS — Z85038 Personal history of other malignant neoplasm of large intestine: Secondary | ICD-10-CM | POA: Diagnosis not present

## 2019-01-09 DIAGNOSIS — I5033 Acute on chronic diastolic (congestive) heart failure: Secondary | ICD-10-CM | POA: Diagnosis not present

## 2019-01-09 DIAGNOSIS — Z9181 History of falling: Secondary | ICD-10-CM | POA: Diagnosis not present

## 2019-01-09 DIAGNOSIS — N183 Chronic kidney disease, stage 3 (moderate): Secondary | ICD-10-CM | POA: Diagnosis not present

## 2019-01-12 DIAGNOSIS — E1122 Type 2 diabetes mellitus with diabetic chronic kidney disease: Secondary | ICD-10-CM | POA: Diagnosis not present

## 2019-01-12 DIAGNOSIS — Z85038 Personal history of other malignant neoplasm of large intestine: Secondary | ICD-10-CM | POA: Diagnosis not present

## 2019-01-12 DIAGNOSIS — I5033 Acute on chronic diastolic (congestive) heart failure: Secondary | ICD-10-CM | POA: Diagnosis not present

## 2019-01-12 DIAGNOSIS — R42 Dizziness and giddiness: Secondary | ICD-10-CM | POA: Diagnosis not present

## 2019-01-12 DIAGNOSIS — G5603 Carpal tunnel syndrome, bilateral upper limbs: Secondary | ICD-10-CM | POA: Diagnosis not present

## 2019-01-12 DIAGNOSIS — N183 Chronic kidney disease, stage 3 (moderate): Secondary | ICD-10-CM | POA: Diagnosis not present

## 2019-01-12 DIAGNOSIS — Z87891 Personal history of nicotine dependence: Secondary | ICD-10-CM | POA: Diagnosis not present

## 2019-01-12 DIAGNOSIS — Z9181 History of falling: Secondary | ICD-10-CM | POA: Diagnosis not present

## 2019-01-12 DIAGNOSIS — I130 Hypertensive heart and chronic kidney disease with heart failure and stage 1 through stage 4 chronic kidney disease, or unspecified chronic kidney disease: Secondary | ICD-10-CM | POA: Diagnosis not present

## 2019-01-15 DIAGNOSIS — R42 Dizziness and giddiness: Secondary | ICD-10-CM | POA: Diagnosis not present

## 2019-01-15 DIAGNOSIS — Z85038 Personal history of other malignant neoplasm of large intestine: Secondary | ICD-10-CM | POA: Diagnosis not present

## 2019-01-15 DIAGNOSIS — N183 Chronic kidney disease, stage 3 (moderate): Secondary | ICD-10-CM | POA: Diagnosis not present

## 2019-01-15 DIAGNOSIS — Z87891 Personal history of nicotine dependence: Secondary | ICD-10-CM | POA: Diagnosis not present

## 2019-01-15 DIAGNOSIS — I5033 Acute on chronic diastolic (congestive) heart failure: Secondary | ICD-10-CM | POA: Diagnosis not present

## 2019-01-15 DIAGNOSIS — I130 Hypertensive heart and chronic kidney disease with heart failure and stage 1 through stage 4 chronic kidney disease, or unspecified chronic kidney disease: Secondary | ICD-10-CM | POA: Diagnosis not present

## 2019-01-15 DIAGNOSIS — Z9181 History of falling: Secondary | ICD-10-CM | POA: Diagnosis not present

## 2019-01-15 DIAGNOSIS — E1122 Type 2 diabetes mellitus with diabetic chronic kidney disease: Secondary | ICD-10-CM | POA: Diagnosis not present

## 2019-01-15 DIAGNOSIS — G5603 Carpal tunnel syndrome, bilateral upper limbs: Secondary | ICD-10-CM | POA: Diagnosis not present

## 2019-01-16 DIAGNOSIS — G5603 Carpal tunnel syndrome, bilateral upper limbs: Secondary | ICD-10-CM | POA: Diagnosis not present

## 2019-01-16 DIAGNOSIS — I130 Hypertensive heart and chronic kidney disease with heart failure and stage 1 through stage 4 chronic kidney disease, or unspecified chronic kidney disease: Secondary | ICD-10-CM | POA: Diagnosis not present

## 2019-01-16 DIAGNOSIS — R42 Dizziness and giddiness: Secondary | ICD-10-CM | POA: Diagnosis not present

## 2019-01-16 DIAGNOSIS — N183 Chronic kidney disease, stage 3 (moderate): Secondary | ICD-10-CM | POA: Diagnosis not present

## 2019-01-16 DIAGNOSIS — Z87891 Personal history of nicotine dependence: Secondary | ICD-10-CM | POA: Diagnosis not present

## 2019-01-16 DIAGNOSIS — I5033 Acute on chronic diastolic (congestive) heart failure: Secondary | ICD-10-CM | POA: Diagnosis not present

## 2019-01-16 DIAGNOSIS — Z85038 Personal history of other malignant neoplasm of large intestine: Secondary | ICD-10-CM | POA: Diagnosis not present

## 2019-01-16 DIAGNOSIS — Z9181 History of falling: Secondary | ICD-10-CM | POA: Diagnosis not present

## 2019-01-16 DIAGNOSIS — E1122 Type 2 diabetes mellitus with diabetic chronic kidney disease: Secondary | ICD-10-CM | POA: Diagnosis not present

## 2019-01-20 DIAGNOSIS — Z9181 History of falling: Secondary | ICD-10-CM | POA: Diagnosis not present

## 2019-01-20 DIAGNOSIS — I130 Hypertensive heart and chronic kidney disease with heart failure and stage 1 through stage 4 chronic kidney disease, or unspecified chronic kidney disease: Secondary | ICD-10-CM | POA: Diagnosis not present

## 2019-01-20 DIAGNOSIS — G5603 Carpal tunnel syndrome, bilateral upper limbs: Secondary | ICD-10-CM | POA: Diagnosis not present

## 2019-01-20 DIAGNOSIS — Z87891 Personal history of nicotine dependence: Secondary | ICD-10-CM | POA: Diagnosis not present

## 2019-01-20 DIAGNOSIS — I5033 Acute on chronic diastolic (congestive) heart failure: Secondary | ICD-10-CM | POA: Diagnosis not present

## 2019-01-20 DIAGNOSIS — Z85038 Personal history of other malignant neoplasm of large intestine: Secondary | ICD-10-CM | POA: Diagnosis not present

## 2019-01-20 DIAGNOSIS — N183 Chronic kidney disease, stage 3 (moderate): Secondary | ICD-10-CM | POA: Diagnosis not present

## 2019-01-20 DIAGNOSIS — R42 Dizziness and giddiness: Secondary | ICD-10-CM | POA: Diagnosis not present

## 2019-01-20 DIAGNOSIS — E1122 Type 2 diabetes mellitus with diabetic chronic kidney disease: Secondary | ICD-10-CM | POA: Diagnosis not present

## 2019-01-21 DIAGNOSIS — I5033 Acute on chronic diastolic (congestive) heart failure: Secondary | ICD-10-CM | POA: Diagnosis not present

## 2019-01-21 DIAGNOSIS — I509 Heart failure, unspecified: Secondary | ICD-10-CM | POA: Diagnosis not present

## 2019-01-21 DIAGNOSIS — I700 Atherosclerosis of aorta: Secondary | ICD-10-CM

## 2019-01-21 DIAGNOSIS — N130 Hydronephrosis with ureteropelvic junction obstruction: Secondary | ICD-10-CM | POA: Diagnosis not present

## 2019-01-21 DIAGNOSIS — Z87891 Personal history of nicotine dependence: Secondary | ICD-10-CM | POA: Diagnosis not present

## 2019-01-21 DIAGNOSIS — Z7982 Long term (current) use of aspirin: Secondary | ICD-10-CM | POA: Diagnosis not present

## 2019-01-21 DIAGNOSIS — N183 Chronic kidney disease, stage 3 (moderate): Secondary | ICD-10-CM | POA: Diagnosis not present

## 2019-01-21 DIAGNOSIS — M6281 Muscle weakness (generalized): Secondary | ICD-10-CM | POA: Diagnosis not present

## 2019-01-21 DIAGNOSIS — I130 Hypertensive heart and chronic kidney disease with heart failure and stage 1 through stage 4 chronic kidney disease, or unspecified chronic kidney disease: Secondary | ICD-10-CM | POA: Diagnosis not present

## 2019-01-21 DIAGNOSIS — J189 Pneumonia, unspecified organism: Secondary | ICD-10-CM | POA: Diagnosis not present

## 2019-01-21 DIAGNOSIS — I517 Cardiomegaly: Secondary | ICD-10-CM

## 2019-01-21 DIAGNOSIS — E1122 Type 2 diabetes mellitus with diabetic chronic kidney disease: Secondary | ICD-10-CM | POA: Diagnosis not present

## 2019-01-21 DIAGNOSIS — Z792 Long term (current) use of antibiotics: Secondary | ICD-10-CM | POA: Diagnosis not present

## 2019-01-21 DIAGNOSIS — N390 Urinary tract infection, site not specified: Secondary | ICD-10-CM | POA: Diagnosis not present

## 2019-01-21 DIAGNOSIS — J90 Pleural effusion, not elsewhere classified: Secondary | ICD-10-CM

## 2019-01-21 DIAGNOSIS — B964 Proteus (mirabilis) (morganii) as the cause of diseases classified elsewhere: Secondary | ICD-10-CM | POA: Diagnosis not present

## 2019-01-21 DIAGNOSIS — G5603 Carpal tunnel syndrome, bilateral upper limbs: Secondary | ICD-10-CM | POA: Diagnosis not present

## 2019-01-21 DIAGNOSIS — Z9181 History of falling: Secondary | ICD-10-CM | POA: Diagnosis not present

## 2019-01-21 DIAGNOSIS — M1990 Unspecified osteoarthritis, unspecified site: Secondary | ICD-10-CM | POA: Diagnosis not present

## 2019-01-21 DIAGNOSIS — Z85038 Personal history of other malignant neoplasm of large intestine: Secondary | ICD-10-CM | POA: Diagnosis not present

## 2019-01-21 HISTORY — DX: Atherosclerosis of aorta: I70.0

## 2019-01-21 HISTORY — DX: Pneumonia, unspecified organism: J18.9

## 2019-01-21 HISTORY — DX: Cardiomegaly: I51.7

## 2019-01-21 HISTORY — DX: Pleural effusion, not elsewhere classified: J90

## 2019-01-22 DIAGNOSIS — N183 Chronic kidney disease, stage 3 (moderate): Secondary | ICD-10-CM | POA: Diagnosis not present

## 2019-01-22 DIAGNOSIS — I509 Heart failure, unspecified: Secondary | ICD-10-CM | POA: Diagnosis not present

## 2019-01-22 DIAGNOSIS — G5603 Carpal tunnel syndrome, bilateral upper limbs: Secondary | ICD-10-CM | POA: Diagnosis not present

## 2019-01-22 DIAGNOSIS — J189 Pneumonia, unspecified organism: Secondary | ICD-10-CM | POA: Diagnosis not present

## 2019-01-22 DIAGNOSIS — I130 Hypertensive heart and chronic kidney disease with heart failure and stage 1 through stage 4 chronic kidney disease, or unspecified chronic kidney disease: Secondary | ICD-10-CM | POA: Diagnosis not present

## 2019-01-22 DIAGNOSIS — M6281 Muscle weakness (generalized): Secondary | ICD-10-CM | POA: Diagnosis not present

## 2019-01-22 DIAGNOSIS — Z85038 Personal history of other malignant neoplasm of large intestine: Secondary | ICD-10-CM | POA: Diagnosis not present

## 2019-01-22 DIAGNOSIS — N390 Urinary tract infection, site not specified: Secondary | ICD-10-CM | POA: Diagnosis not present

## 2019-01-22 DIAGNOSIS — Z7982 Long term (current) use of aspirin: Secondary | ICD-10-CM | POA: Diagnosis not present

## 2019-01-22 DIAGNOSIS — I5033 Acute on chronic diastolic (congestive) heart failure: Secondary | ICD-10-CM | POA: Diagnosis not present

## 2019-01-22 DIAGNOSIS — Z87891 Personal history of nicotine dependence: Secondary | ICD-10-CM | POA: Diagnosis not present

## 2019-01-22 DIAGNOSIS — B964 Proteus (mirabilis) (morganii) as the cause of diseases classified elsewhere: Secondary | ICD-10-CM | POA: Diagnosis not present

## 2019-01-22 DIAGNOSIS — Z792 Long term (current) use of antibiotics: Secondary | ICD-10-CM | POA: Diagnosis not present

## 2019-01-22 DIAGNOSIS — M1990 Unspecified osteoarthritis, unspecified site: Secondary | ICD-10-CM | POA: Diagnosis not present

## 2019-01-22 DIAGNOSIS — Z9181 History of falling: Secondary | ICD-10-CM | POA: Diagnosis not present

## 2019-01-22 DIAGNOSIS — N130 Hydronephrosis with ureteropelvic junction obstruction: Secondary | ICD-10-CM | POA: Diagnosis not present

## 2019-01-22 DIAGNOSIS — E1122 Type 2 diabetes mellitus with diabetic chronic kidney disease: Secondary | ICD-10-CM | POA: Diagnosis not present

## 2019-01-26 DIAGNOSIS — I509 Heart failure, unspecified: Secondary | ICD-10-CM | POA: Diagnosis not present

## 2019-01-26 DIAGNOSIS — M6281 Muscle weakness (generalized): Secondary | ICD-10-CM | POA: Diagnosis not present

## 2019-01-26 DIAGNOSIS — N390 Urinary tract infection, site not specified: Secondary | ICD-10-CM | POA: Diagnosis not present

## 2019-01-26 DIAGNOSIS — N183 Chronic kidney disease, stage 3 (moderate): Secondary | ICD-10-CM | POA: Diagnosis not present

## 2019-01-26 DIAGNOSIS — M1990 Unspecified osteoarthritis, unspecified site: Secondary | ICD-10-CM | POA: Diagnosis not present

## 2019-01-26 DIAGNOSIS — Z85038 Personal history of other malignant neoplasm of large intestine: Secondary | ICD-10-CM | POA: Diagnosis not present

## 2019-01-26 DIAGNOSIS — E1122 Type 2 diabetes mellitus with diabetic chronic kidney disease: Secondary | ICD-10-CM | POA: Diagnosis not present

## 2019-01-26 DIAGNOSIS — I130 Hypertensive heart and chronic kidney disease with heart failure and stage 1 through stage 4 chronic kidney disease, or unspecified chronic kidney disease: Secondary | ICD-10-CM | POA: Diagnosis not present

## 2019-01-26 DIAGNOSIS — Z87891 Personal history of nicotine dependence: Secondary | ICD-10-CM | POA: Diagnosis not present

## 2019-01-26 DIAGNOSIS — J189 Pneumonia, unspecified organism: Secondary | ICD-10-CM | POA: Diagnosis not present

## 2019-01-26 DIAGNOSIS — N130 Hydronephrosis with ureteropelvic junction obstruction: Secondary | ICD-10-CM | POA: Diagnosis not present

## 2019-01-26 DIAGNOSIS — Z792 Long term (current) use of antibiotics: Secondary | ICD-10-CM | POA: Diagnosis not present

## 2019-01-26 DIAGNOSIS — I5033 Acute on chronic diastolic (congestive) heart failure: Secondary | ICD-10-CM | POA: Diagnosis not present

## 2019-01-26 DIAGNOSIS — Z7982 Long term (current) use of aspirin: Secondary | ICD-10-CM | POA: Diagnosis not present

## 2019-01-26 DIAGNOSIS — G5603 Carpal tunnel syndrome, bilateral upper limbs: Secondary | ICD-10-CM | POA: Diagnosis not present

## 2019-01-26 DIAGNOSIS — B964 Proteus (mirabilis) (morganii) as the cause of diseases classified elsewhere: Secondary | ICD-10-CM | POA: Diagnosis not present

## 2019-01-26 DIAGNOSIS — Z9181 History of falling: Secondary | ICD-10-CM | POA: Diagnosis not present

## 2019-01-28 DIAGNOSIS — M1990 Unspecified osteoarthritis, unspecified site: Secondary | ICD-10-CM | POA: Diagnosis not present

## 2019-01-28 DIAGNOSIS — E1122 Type 2 diabetes mellitus with diabetic chronic kidney disease: Secondary | ICD-10-CM | POA: Diagnosis not present

## 2019-01-28 DIAGNOSIS — J189 Pneumonia, unspecified organism: Secondary | ICD-10-CM | POA: Diagnosis not present

## 2019-01-28 DIAGNOSIS — Z85038 Personal history of other malignant neoplasm of large intestine: Secondary | ICD-10-CM | POA: Diagnosis not present

## 2019-01-28 DIAGNOSIS — I5033 Acute on chronic diastolic (congestive) heart failure: Secondary | ICD-10-CM | POA: Diagnosis not present

## 2019-01-28 DIAGNOSIS — Z9181 History of falling: Secondary | ICD-10-CM | POA: Diagnosis not present

## 2019-01-28 DIAGNOSIS — Z792 Long term (current) use of antibiotics: Secondary | ICD-10-CM | POA: Diagnosis not present

## 2019-01-28 DIAGNOSIS — B964 Proteus (mirabilis) (morganii) as the cause of diseases classified elsewhere: Secondary | ICD-10-CM | POA: Diagnosis not present

## 2019-01-28 DIAGNOSIS — M6281 Muscle weakness (generalized): Secondary | ICD-10-CM | POA: Diagnosis not present

## 2019-01-28 DIAGNOSIS — N130 Hydronephrosis with ureteropelvic junction obstruction: Secondary | ICD-10-CM | POA: Diagnosis not present

## 2019-01-28 DIAGNOSIS — I509 Heart failure, unspecified: Secondary | ICD-10-CM | POA: Diagnosis not present

## 2019-01-28 DIAGNOSIS — N390 Urinary tract infection, site not specified: Secondary | ICD-10-CM | POA: Diagnosis not present

## 2019-01-28 DIAGNOSIS — Z7982 Long term (current) use of aspirin: Secondary | ICD-10-CM | POA: Diagnosis not present

## 2019-01-28 DIAGNOSIS — I130 Hypertensive heart and chronic kidney disease with heart failure and stage 1 through stage 4 chronic kidney disease, or unspecified chronic kidney disease: Secondary | ICD-10-CM | POA: Diagnosis not present

## 2019-01-28 DIAGNOSIS — Z87891 Personal history of nicotine dependence: Secondary | ICD-10-CM | POA: Diagnosis not present

## 2019-01-28 DIAGNOSIS — N183 Chronic kidney disease, stage 3 (moderate): Secondary | ICD-10-CM | POA: Diagnosis not present

## 2019-01-28 DIAGNOSIS — G5603 Carpal tunnel syndrome, bilateral upper limbs: Secondary | ICD-10-CM | POA: Diagnosis not present

## 2019-01-29 DIAGNOSIS — Z87891 Personal history of nicotine dependence: Secondary | ICD-10-CM | POA: Diagnosis not present

## 2019-01-29 DIAGNOSIS — I5033 Acute on chronic diastolic (congestive) heart failure: Secondary | ICD-10-CM | POA: Diagnosis not present

## 2019-01-29 DIAGNOSIS — Z9181 History of falling: Secondary | ICD-10-CM | POA: Diagnosis not present

## 2019-01-29 DIAGNOSIS — N130 Hydronephrosis with ureteropelvic junction obstruction: Secondary | ICD-10-CM | POA: Diagnosis not present

## 2019-01-29 DIAGNOSIS — M1990 Unspecified osteoarthritis, unspecified site: Secondary | ICD-10-CM | POA: Diagnosis not present

## 2019-01-29 DIAGNOSIS — Z7982 Long term (current) use of aspirin: Secondary | ICD-10-CM | POA: Diagnosis not present

## 2019-01-29 DIAGNOSIS — I130 Hypertensive heart and chronic kidney disease with heart failure and stage 1 through stage 4 chronic kidney disease, or unspecified chronic kidney disease: Secondary | ICD-10-CM | POA: Diagnosis not present

## 2019-01-29 DIAGNOSIS — E1122 Type 2 diabetes mellitus with diabetic chronic kidney disease: Secondary | ICD-10-CM | POA: Diagnosis not present

## 2019-01-29 DIAGNOSIS — Z792 Long term (current) use of antibiotics: Secondary | ICD-10-CM | POA: Diagnosis not present

## 2019-01-29 DIAGNOSIS — Z85038 Personal history of other malignant neoplasm of large intestine: Secondary | ICD-10-CM | POA: Diagnosis not present

## 2019-01-29 DIAGNOSIS — B964 Proteus (mirabilis) (morganii) as the cause of diseases classified elsewhere: Secondary | ICD-10-CM | POA: Diagnosis not present

## 2019-01-29 DIAGNOSIS — G5603 Carpal tunnel syndrome, bilateral upper limbs: Secondary | ICD-10-CM | POA: Diagnosis not present

## 2019-01-29 DIAGNOSIS — M6281 Muscle weakness (generalized): Secondary | ICD-10-CM | POA: Diagnosis not present

## 2019-01-29 DIAGNOSIS — I509 Heart failure, unspecified: Secondary | ICD-10-CM | POA: Diagnosis not present

## 2019-01-29 DIAGNOSIS — N183 Chronic kidney disease, stage 3 (moderate): Secondary | ICD-10-CM | POA: Diagnosis not present

## 2019-01-29 DIAGNOSIS — J189 Pneumonia, unspecified organism: Secondary | ICD-10-CM | POA: Diagnosis not present

## 2019-01-29 DIAGNOSIS — N390 Urinary tract infection, site not specified: Secondary | ICD-10-CM | POA: Diagnosis not present

## 2019-02-03 DIAGNOSIS — I130 Hypertensive heart and chronic kidney disease with heart failure and stage 1 through stage 4 chronic kidney disease, or unspecified chronic kidney disease: Secondary | ICD-10-CM | POA: Diagnosis not present

## 2019-02-03 DIAGNOSIS — N183 Chronic kidney disease, stage 3 (moderate): Secondary | ICD-10-CM | POA: Diagnosis not present

## 2019-02-03 DIAGNOSIS — R310 Gross hematuria: Secondary | ICD-10-CM | POA: Diagnosis not present

## 2019-02-03 DIAGNOSIS — Z87891 Personal history of nicotine dependence: Secondary | ICD-10-CM | POA: Diagnosis not present

## 2019-02-03 DIAGNOSIS — M6281 Muscle weakness (generalized): Secondary | ICD-10-CM | POA: Diagnosis not present

## 2019-02-03 DIAGNOSIS — B964 Proteus (mirabilis) (morganii) as the cause of diseases classified elsewhere: Secondary | ICD-10-CM | POA: Diagnosis not present

## 2019-02-03 DIAGNOSIS — Z9181 History of falling: Secondary | ICD-10-CM | POA: Diagnosis not present

## 2019-02-03 DIAGNOSIS — Z85038 Personal history of other malignant neoplasm of large intestine: Secondary | ICD-10-CM | POA: Diagnosis not present

## 2019-02-03 DIAGNOSIS — Z792 Long term (current) use of antibiotics: Secondary | ICD-10-CM | POA: Diagnosis not present

## 2019-02-03 DIAGNOSIS — J189 Pneumonia, unspecified organism: Secondary | ICD-10-CM | POA: Diagnosis not present

## 2019-02-03 DIAGNOSIS — I509 Heart failure, unspecified: Secondary | ICD-10-CM | POA: Diagnosis not present

## 2019-02-03 DIAGNOSIS — G5603 Carpal tunnel syndrome, bilateral upper limbs: Secondary | ICD-10-CM | POA: Diagnosis not present

## 2019-02-03 DIAGNOSIS — I5033 Acute on chronic diastolic (congestive) heart failure: Secondary | ICD-10-CM | POA: Diagnosis not present

## 2019-02-03 DIAGNOSIS — E1122 Type 2 diabetes mellitus with diabetic chronic kidney disease: Secondary | ICD-10-CM | POA: Diagnosis not present

## 2019-02-03 DIAGNOSIS — N130 Hydronephrosis with ureteropelvic junction obstruction: Secondary | ICD-10-CM | POA: Diagnosis not present

## 2019-02-03 DIAGNOSIS — M1990 Unspecified osteoarthritis, unspecified site: Secondary | ICD-10-CM | POA: Diagnosis not present

## 2019-02-03 DIAGNOSIS — Z7982 Long term (current) use of aspirin: Secondary | ICD-10-CM | POA: Diagnosis not present

## 2019-02-03 DIAGNOSIS — N390 Urinary tract infection, site not specified: Secondary | ICD-10-CM | POA: Diagnosis not present

## 2019-02-04 DIAGNOSIS — I110 Hypertensive heart disease with heart failure: Secondary | ICD-10-CM | POA: Diagnosis not present

## 2019-02-04 DIAGNOSIS — M130 Polyarthritis, unspecified: Secondary | ICD-10-CM | POA: Diagnosis not present

## 2019-02-04 DIAGNOSIS — E1169 Type 2 diabetes mellitus with other specified complication: Secondary | ICD-10-CM | POA: Diagnosis not present

## 2019-02-04 DIAGNOSIS — N184 Chronic kidney disease, stage 4 (severe): Secondary | ICD-10-CM | POA: Diagnosis not present

## 2019-02-04 DIAGNOSIS — J449 Chronic obstructive pulmonary disease, unspecified: Secondary | ICD-10-CM | POA: Diagnosis not present

## 2019-02-05 DIAGNOSIS — I509 Heart failure, unspecified: Secondary | ICD-10-CM | POA: Diagnosis not present

## 2019-02-05 DIAGNOSIS — B964 Proteus (mirabilis) (morganii) as the cause of diseases classified elsewhere: Secondary | ICD-10-CM | POA: Diagnosis not present

## 2019-02-05 DIAGNOSIS — N390 Urinary tract infection, site not specified: Secondary | ICD-10-CM | POA: Diagnosis not present

## 2019-02-05 DIAGNOSIS — J189 Pneumonia, unspecified organism: Secondary | ICD-10-CM | POA: Diagnosis not present

## 2019-02-05 DIAGNOSIS — I5033 Acute on chronic diastolic (congestive) heart failure: Secondary | ICD-10-CM | POA: Diagnosis not present

## 2019-02-05 DIAGNOSIS — Z792 Long term (current) use of antibiotics: Secondary | ICD-10-CM | POA: Diagnosis not present

## 2019-02-05 DIAGNOSIS — Z9181 History of falling: Secondary | ICD-10-CM | POA: Diagnosis not present

## 2019-02-05 DIAGNOSIS — N183 Chronic kidney disease, stage 3 (moderate): Secondary | ICD-10-CM | POA: Diagnosis not present

## 2019-02-05 DIAGNOSIS — Z85038 Personal history of other malignant neoplasm of large intestine: Secondary | ICD-10-CM | POA: Diagnosis not present

## 2019-02-05 DIAGNOSIS — Z87891 Personal history of nicotine dependence: Secondary | ICD-10-CM | POA: Diagnosis not present

## 2019-02-05 DIAGNOSIS — I130 Hypertensive heart and chronic kidney disease with heart failure and stage 1 through stage 4 chronic kidney disease, or unspecified chronic kidney disease: Secondary | ICD-10-CM | POA: Diagnosis not present

## 2019-02-05 DIAGNOSIS — N130 Hydronephrosis with ureteropelvic junction obstruction: Secondary | ICD-10-CM | POA: Diagnosis not present

## 2019-02-05 DIAGNOSIS — Z7982 Long term (current) use of aspirin: Secondary | ICD-10-CM | POA: Diagnosis not present

## 2019-02-05 DIAGNOSIS — M6281 Muscle weakness (generalized): Secondary | ICD-10-CM | POA: Diagnosis not present

## 2019-02-05 DIAGNOSIS — E1122 Type 2 diabetes mellitus with diabetic chronic kidney disease: Secondary | ICD-10-CM | POA: Diagnosis not present

## 2019-02-05 DIAGNOSIS — M1990 Unspecified osteoarthritis, unspecified site: Secondary | ICD-10-CM | POA: Diagnosis not present

## 2019-02-05 DIAGNOSIS — G5603 Carpal tunnel syndrome, bilateral upper limbs: Secondary | ICD-10-CM | POA: Diagnosis not present

## 2019-02-10 DIAGNOSIS — Z85038 Personal history of other malignant neoplasm of large intestine: Secondary | ICD-10-CM | POA: Diagnosis not present

## 2019-02-10 DIAGNOSIS — I130 Hypertensive heart and chronic kidney disease with heart failure and stage 1 through stage 4 chronic kidney disease, or unspecified chronic kidney disease: Secondary | ICD-10-CM | POA: Diagnosis not present

## 2019-02-10 DIAGNOSIS — I509 Heart failure, unspecified: Secondary | ICD-10-CM | POA: Diagnosis not present

## 2019-02-10 DIAGNOSIS — J189 Pneumonia, unspecified organism: Secondary | ICD-10-CM | POA: Diagnosis not present

## 2019-02-10 DIAGNOSIS — N390 Urinary tract infection, site not specified: Secondary | ICD-10-CM | POA: Diagnosis not present

## 2019-02-10 DIAGNOSIS — Z9181 History of falling: Secondary | ICD-10-CM | POA: Diagnosis not present

## 2019-02-10 DIAGNOSIS — Z792 Long term (current) use of antibiotics: Secondary | ICD-10-CM | POA: Diagnosis not present

## 2019-02-10 DIAGNOSIS — Z87891 Personal history of nicotine dependence: Secondary | ICD-10-CM | POA: Diagnosis not present

## 2019-02-10 DIAGNOSIS — M1990 Unspecified osteoarthritis, unspecified site: Secondary | ICD-10-CM | POA: Diagnosis not present

## 2019-02-10 DIAGNOSIS — N183 Chronic kidney disease, stage 3 (moderate): Secondary | ICD-10-CM | POA: Diagnosis not present

## 2019-02-10 DIAGNOSIS — I5033 Acute on chronic diastolic (congestive) heart failure: Secondary | ICD-10-CM | POA: Diagnosis not present

## 2019-02-10 DIAGNOSIS — M6281 Muscle weakness (generalized): Secondary | ICD-10-CM | POA: Diagnosis not present

## 2019-02-10 DIAGNOSIS — E1122 Type 2 diabetes mellitus with diabetic chronic kidney disease: Secondary | ICD-10-CM | POA: Diagnosis not present

## 2019-02-10 DIAGNOSIS — Z7982 Long term (current) use of aspirin: Secondary | ICD-10-CM | POA: Diagnosis not present

## 2019-02-10 DIAGNOSIS — B964 Proteus (mirabilis) (morganii) as the cause of diseases classified elsewhere: Secondary | ICD-10-CM | POA: Diagnosis not present

## 2019-02-10 DIAGNOSIS — N130 Hydronephrosis with ureteropelvic junction obstruction: Secondary | ICD-10-CM | POA: Diagnosis not present

## 2019-02-10 DIAGNOSIS — G5603 Carpal tunnel syndrome, bilateral upper limbs: Secondary | ICD-10-CM | POA: Diagnosis not present

## 2019-02-11 DIAGNOSIS — Z87891 Personal history of nicotine dependence: Secondary | ICD-10-CM | POA: Diagnosis not present

## 2019-02-11 DIAGNOSIS — M1990 Unspecified osteoarthritis, unspecified site: Secondary | ICD-10-CM | POA: Diagnosis not present

## 2019-02-11 DIAGNOSIS — J189 Pneumonia, unspecified organism: Secondary | ICD-10-CM | POA: Diagnosis not present

## 2019-02-11 DIAGNOSIS — N183 Chronic kidney disease, stage 3 (moderate): Secondary | ICD-10-CM | POA: Diagnosis not present

## 2019-02-11 DIAGNOSIS — I130 Hypertensive heart and chronic kidney disease with heart failure and stage 1 through stage 4 chronic kidney disease, or unspecified chronic kidney disease: Secondary | ICD-10-CM | POA: Diagnosis not present

## 2019-02-11 DIAGNOSIS — E1122 Type 2 diabetes mellitus with diabetic chronic kidney disease: Secondary | ICD-10-CM | POA: Diagnosis not present

## 2019-02-11 DIAGNOSIS — B964 Proteus (mirabilis) (morganii) as the cause of diseases classified elsewhere: Secondary | ICD-10-CM | POA: Diagnosis not present

## 2019-02-11 DIAGNOSIS — Z792 Long term (current) use of antibiotics: Secondary | ICD-10-CM | POA: Diagnosis not present

## 2019-02-11 DIAGNOSIS — N390 Urinary tract infection, site not specified: Secondary | ICD-10-CM | POA: Diagnosis not present

## 2019-02-11 DIAGNOSIS — Z7982 Long term (current) use of aspirin: Secondary | ICD-10-CM | POA: Diagnosis not present

## 2019-02-11 DIAGNOSIS — G5603 Carpal tunnel syndrome, bilateral upper limbs: Secondary | ICD-10-CM | POA: Diagnosis not present

## 2019-02-11 DIAGNOSIS — N130 Hydronephrosis with ureteropelvic junction obstruction: Secondary | ICD-10-CM | POA: Diagnosis not present

## 2019-02-11 DIAGNOSIS — I5033 Acute on chronic diastolic (congestive) heart failure: Secondary | ICD-10-CM | POA: Diagnosis not present

## 2019-02-11 DIAGNOSIS — Z9181 History of falling: Secondary | ICD-10-CM | POA: Diagnosis not present

## 2019-02-11 DIAGNOSIS — Z85038 Personal history of other malignant neoplasm of large intestine: Secondary | ICD-10-CM | POA: Diagnosis not present

## 2019-02-11 DIAGNOSIS — I509 Heart failure, unspecified: Secondary | ICD-10-CM | POA: Diagnosis not present

## 2019-02-11 DIAGNOSIS — M6281 Muscle weakness (generalized): Secondary | ICD-10-CM | POA: Diagnosis not present

## 2019-02-13 ENCOUNTER — Encounter (HOSPITAL_COMMUNITY): Payer: Self-pay

## 2019-02-13 ENCOUNTER — Emergency Department (HOSPITAL_COMMUNITY): Payer: Medicare Other

## 2019-02-13 ENCOUNTER — Other Ambulatory Visit: Payer: Self-pay

## 2019-02-13 ENCOUNTER — Inpatient Hospital Stay (HOSPITAL_COMMUNITY)
Admission: EM | Admit: 2019-02-13 | Discharge: 2019-02-17 | DRG: 722 | Disposition: A | Discharge status: Home / Self Care | Payer: Medicare Other | Attending: Internal Medicine | Admitting: Internal Medicine

## 2019-02-13 DIAGNOSIS — R310 Gross hematuria: Secondary | ICD-10-CM | POA: Diagnosis not present

## 2019-02-13 DIAGNOSIS — J90 Pleural effusion, not elsewhere classified: Secondary | ICD-10-CM | POA: Diagnosis not present

## 2019-02-13 DIAGNOSIS — N390 Urinary tract infection, site not specified: Secondary | ICD-10-CM | POA: Diagnosis not present

## 2019-02-13 DIAGNOSIS — I700 Atherosclerosis of aorta: Secondary | ICD-10-CM | POA: Diagnosis not present

## 2019-02-13 DIAGNOSIS — R062 Wheezing: Secondary | ICD-10-CM

## 2019-02-13 DIAGNOSIS — E872 Acidosis: Secondary | ICD-10-CM | POA: Diagnosis present

## 2019-02-13 DIAGNOSIS — I10 Essential (primary) hypertension: Secondary | ICD-10-CM | POA: Diagnosis not present

## 2019-02-13 DIAGNOSIS — F329 Major depressive disorder, single episode, unspecified: Secondary | ICD-10-CM | POA: Diagnosis present

## 2019-02-13 DIAGNOSIS — J181 Lobar pneumonia, unspecified organism: Secondary | ICD-10-CM

## 2019-02-13 DIAGNOSIS — R0602 Shortness of breath: Secondary | ICD-10-CM

## 2019-02-13 DIAGNOSIS — Z6832 Body mass index (BMI) 32.0-32.9, adult: Secondary | ICD-10-CM

## 2019-02-13 DIAGNOSIS — J189 Pneumonia, unspecified organism: Secondary | ICD-10-CM | POA: Diagnosis not present

## 2019-02-13 DIAGNOSIS — I5033 Acute on chronic diastolic (congestive) heart failure: Secondary | ICD-10-CM | POA: Diagnosis not present

## 2019-02-13 DIAGNOSIS — K5900 Constipation, unspecified: Secondary | ICD-10-CM | POA: Diagnosis not present

## 2019-02-13 DIAGNOSIS — Z833 Family history of diabetes mellitus: Secondary | ICD-10-CM

## 2019-02-13 DIAGNOSIS — I5032 Chronic diastolic (congestive) heart failure: Secondary | ICD-10-CM

## 2019-02-13 DIAGNOSIS — R1030 Lower abdominal pain, unspecified: Secondary | ICD-10-CM | POA: Diagnosis not present

## 2019-02-13 DIAGNOSIS — N136 Pyonephrosis: Secondary | ICD-10-CM | POA: Diagnosis not present

## 2019-02-13 DIAGNOSIS — Z79899 Other long term (current) drug therapy: Secondary | ICD-10-CM

## 2019-02-13 DIAGNOSIS — I130 Hypertensive heart and chronic kidney disease with heart failure and stage 1 through stage 4 chronic kidney disease, or unspecified chronic kidney disease: Secondary | ICD-10-CM | POA: Diagnosis not present

## 2019-02-13 DIAGNOSIS — N183 Chronic kidney disease, stage 3 (moderate): Secondary | ICD-10-CM | POA: Diagnosis not present

## 2019-02-13 DIAGNOSIS — A498 Other bacterial infections of unspecified site: Secondary | ICD-10-CM | POA: Diagnosis not present

## 2019-02-13 DIAGNOSIS — N1832 Chronic kidney disease, stage 3b: Secondary | ICD-10-CM

## 2019-02-13 DIAGNOSIS — C61 Malignant neoplasm of prostate: Principal | ICD-10-CM | POA: Diagnosis present

## 2019-02-13 DIAGNOSIS — N179 Acute kidney failure, unspecified: Secondary | ICD-10-CM

## 2019-02-13 DIAGNOSIS — Z1611 Resistance to penicillins: Secondary | ICD-10-CM | POA: Diagnosis present

## 2019-02-13 DIAGNOSIS — R0600 Dyspnea, unspecified: Secondary | ICD-10-CM

## 2019-02-13 DIAGNOSIS — Z87891 Personal history of nicotine dependence: Secondary | ICD-10-CM | POA: Diagnosis not present

## 2019-02-13 DIAGNOSIS — R0902 Hypoxemia: Secondary | ICD-10-CM | POA: Diagnosis not present

## 2019-02-13 DIAGNOSIS — C7911 Secondary malignant neoplasm of bladder: Secondary | ICD-10-CM | POA: Diagnosis present

## 2019-02-13 DIAGNOSIS — E669 Obesity, unspecified: Secondary | ICD-10-CM | POA: Diagnosis present

## 2019-02-13 DIAGNOSIS — N3289 Other specified disorders of bladder: Secondary | ICD-10-CM | POA: Diagnosis not present

## 2019-02-13 DIAGNOSIS — Z7982 Long term (current) use of aspirin: Secondary | ICD-10-CM | POA: Diagnosis not present

## 2019-02-13 DIAGNOSIS — K862 Cyst of pancreas: Secondary | ICD-10-CM | POA: Diagnosis not present

## 2019-02-13 DIAGNOSIS — N400 Enlarged prostate without lower urinary tract symptoms: Secondary | ICD-10-CM | POA: Diagnosis present

## 2019-02-13 DIAGNOSIS — R109 Unspecified abdominal pain: Secondary | ICD-10-CM | POA: Diagnosis present

## 2019-02-13 DIAGNOSIS — Z85038 Personal history of other malignant neoplasm of large intestine: Secondary | ICD-10-CM

## 2019-02-13 DIAGNOSIS — N1330 Unspecified hydronephrosis: Secondary | ICD-10-CM | POA: Diagnosis not present

## 2019-02-13 DIAGNOSIS — E1122 Type 2 diabetes mellitus with diabetic chronic kidney disease: Secondary | ICD-10-CM | POA: Diagnosis present

## 2019-02-13 DIAGNOSIS — R1084 Generalized abdominal pain: Secondary | ICD-10-CM | POA: Diagnosis not present

## 2019-02-13 DIAGNOSIS — N200 Calculus of kidney: Secondary | ICD-10-CM | POA: Diagnosis not present

## 2019-02-13 DIAGNOSIS — D631 Anemia in chronic kidney disease: Secondary | ICD-10-CM | POA: Diagnosis not present

## 2019-02-13 DIAGNOSIS — I5030 Unspecified diastolic (congestive) heart failure: Secondary | ICD-10-CM | POA: Diagnosis present

## 2019-02-13 DIAGNOSIS — B964 Proteus (mirabilis) (morganii) as the cause of diseases classified elsewhere: Secondary | ICD-10-CM | POA: Diagnosis present

## 2019-02-13 DIAGNOSIS — R319 Hematuria, unspecified: Secondary | ICD-10-CM | POA: Diagnosis present

## 2019-02-13 LAB — CBC WITH DIFFERENTIAL/PLATELET
Abs Immature Granulocytes: 0.01 10*3/uL (ref 0.00–0.07)
Basophils Absolute: 0.1 10*3/uL (ref 0.0–0.1)
Basophils Relative: 1 %
Eosinophils Absolute: 0.2 10*3/uL (ref 0.0–0.5)
Eosinophils Relative: 4 %
HCT: 34.4 % — ABNORMAL LOW (ref 39.0–52.0)
Hemoglobin: 11 g/dL — ABNORMAL LOW (ref 13.0–17.0)
Immature Granulocytes: 0 %
Lymphocytes Relative: 37 %
Lymphs Abs: 1.7 10*3/uL (ref 0.7–4.0)
MCH: 26.4 pg (ref 26.0–34.0)
MCHC: 32 g/dL (ref 30.0–36.0)
MCV: 82.7 fL (ref 80.0–100.0)
Monocytes Absolute: 0.4 10*3/uL (ref 0.1–1.0)
Monocytes Relative: 9 %
Neutro Abs: 2.2 10*3/uL (ref 1.7–7.7)
Neutrophils Relative %: 49 %
Platelets: 228 10*3/uL (ref 150–400)
RBC: 4.16 MIL/uL — ABNORMAL LOW (ref 4.22–5.81)
RDW: 14.8 % (ref 11.5–15.5)
WBC: 4.5 10*3/uL (ref 4.0–10.5)
nRBC: 0 % (ref 0.0–0.2)

## 2019-02-13 LAB — COMPREHENSIVE METABOLIC PANEL
ALT: 15 U/L (ref 0–44)
AST: 20 U/L (ref 15–41)
Albumin: 3.4 g/dL — ABNORMAL LOW (ref 3.5–5.0)
Alkaline Phosphatase: 98 U/L (ref 38–126)
Anion gap: 9 (ref 5–15)
BUN: 32 mg/dL — ABNORMAL HIGH (ref 8–23)
CO2: 22 mmol/L (ref 22–32)
Calcium: 9.3 mg/dL (ref 8.9–10.3)
Chloride: 111 mmol/L (ref 98–111)
Creatinine, Ser: 2.41 mg/dL — ABNORMAL HIGH (ref 0.61–1.24)
GFR calc Af Amer: 26 mL/min — ABNORMAL LOW (ref >60)
GFR calc non Af Amer: 22 mL/min — ABNORMAL LOW (ref >60)
Glucose, Bld: 74 mg/dL (ref 70–99)
Potassium: 4.2 mmol/L (ref 3.5–5.1)
Sodium: 142 mmol/L (ref 135–145)
Total Bilirubin: 0.7 mg/dL (ref 0.3–1.2)
Total Protein: 7.4 g/dL (ref 6.5–8.1)

## 2019-02-13 LAB — URINALYSIS, ROUTINE W REFLEX MICROSCOPIC
Bilirubin Urine: NEGATIVE
Glucose, UA: NEGATIVE mg/dL
Ketones, ur: NEGATIVE mg/dL
Nitrite: POSITIVE — AB
Protein, ur: 100 mg/dL — AB
RBC / HPF: >50 RBC/hpf — ABNORMAL HIGH (ref 0–5)
Specific Gravity, Urine: 1.01 (ref 1.005–1.030)
WBC, UA: >50 WBC/hpf — ABNORMAL HIGH (ref 0–5)
pH: 8 (ref 5.0–8.0)

## 2019-02-13 MED ORDER — AMLODIPINE BESYLATE 10 MG PO TABS
10.0000 mg | ORAL_TABLET | Freq: Every day | ORAL | Status: DC
  Administered 2019-02-13 – 2019-02-17 (×5): 10 mg via ORAL
  Filled 2019-02-13 (×5): qty 1

## 2019-02-13 MED ORDER — SODIUM CHLORIDE 0.9 % IV SOLN
1.0000 g | INTRAVENOUS | Status: DC
  Administered 2019-02-14 – 2019-02-15 (×2): 1 g via INTRAVENOUS
  Filled 2019-02-13: qty 10
  Filled 2019-02-13 (×2): qty 1

## 2019-02-13 MED ORDER — POLYETHYLENE GLYCOL 3350 17 G PO PACK: 17.0000 g | PACK | Freq: Every day | ORAL | Status: DC | PRN

## 2019-02-13 MED ORDER — NEOMYCIN-POLYMYXIN-DEXAMETH 3.5-10000-0.1 OP SUSP
1.0000 [drp] | OPHTHALMIC | Status: DC
  Administered 2019-02-13 – 2019-02-17 (×20): 1 [drp] via OPHTHALMIC
  Filled 2019-02-13: qty 5

## 2019-02-13 MED ORDER — TAMSULOSIN HCL 0.4 MG PO CAPS
0.4000 mg | ORAL_CAPSULE | Freq: Every day | ORAL | Status: DC
  Administered 2019-02-13 – 2019-02-17 (×5): 0.4 mg via ORAL
  Filled 2019-02-13 (×6): qty 1

## 2019-02-13 MED ORDER — OXYBUTYNIN CHLORIDE 5 MG PO TABS
2.5000 mg | ORAL_TABLET | Freq: Once | ORAL | Status: AC
  Administered 2019-02-13: 2.5 mg via ORAL
  Filled 2019-02-13: qty 1

## 2019-02-13 MED ORDER — ONDANSETRON HCL 4 MG PO TABS: 4.0000 mg | ORAL_TABLET | Freq: Four times a day (QID) | ORAL | Status: DC | PRN

## 2019-02-13 MED ORDER — OXYBUTYNIN CHLORIDE 5 MG PO TABS
2.5000 mg | ORAL_TABLET | Freq: Three times a day (TID) | ORAL | Status: DC
  Administered 2019-02-13 – 2019-02-17 (×12): 2.5 mg via ORAL
  Filled 2019-02-13 (×12): qty 1

## 2019-02-13 MED ORDER — SODIUM CHLORIDE 0.9 % IV SOLN
INTRAVENOUS | Status: AC
  Administered 2019-02-13 – 2019-02-14 (×2): via INTRAVENOUS

## 2019-02-13 MED ORDER — ACETAMINOPHEN 650 MG RE SUPP: 650.0000 mg | Freq: Four times a day (QID) | RECTAL | Status: DC | PRN

## 2019-02-13 MED ORDER — ACETAMINOPHEN 325 MG PO TABS
650.0000 mg | ORAL_TABLET | Freq: Once | ORAL | Status: AC
  Administered 2019-02-13: 650 mg via ORAL
  Filled 2019-02-13: qty 2

## 2019-02-13 MED ORDER — ACETAMINOPHEN 325 MG PO TABS: 650.0000 mg | ORAL_TABLET | Freq: Four times a day (QID) | ORAL | Status: DC | PRN

## 2019-02-13 MED ORDER — ONDANSETRON HCL 4 MG/2ML IJ SOLN: 4.0000 mg | Freq: Four times a day (QID) | INTRAMUSCULAR | Status: DC | PRN

## 2019-02-13 MED ORDER — CITALOPRAM HYDROBROMIDE 20 MG PO TABS
10.0000 mg | ORAL_TABLET | Freq: Every day | ORAL | Status: DC
  Administered 2019-02-14 – 2019-02-17 (×4): 10 mg via ORAL
  Filled 2019-02-13 (×4): qty 1

## 2019-02-13 MED ORDER — SODIUM CHLORIDE 0.9 % IV SOLN
1.0000 g | Freq: Once | INTRAVENOUS | Status: AC
  Administered 2019-02-13: 1 g via INTRAVENOUS
  Filled 2019-02-13: qty 10

## 2019-02-13 NOTE — ED Notes (Signed)
Unsuccessful IV attempt by Agricultural consultant. IV consult placed.

## 2019-02-13 NOTE — ED Notes (Signed)
IV team at beside 

## 2019-02-13 NOTE — ED Notes (Signed)
Hospitalist at bedside 

## 2019-02-13 NOTE — ED Notes (Signed)
ED TO INPATIENT HANDOFF REPORT  ED Nurse Name and Phone #: Beth RN  S Name/Age/Gender [name] 85 y.o. male Room/Bed: WA13/WA13  Code Status   Code Status: Prior  Home/SNF/Other Home Patient oriented to: self, place, time and situation Is this baseline? Yes   Triage Complete: Triage complete  Chief Complaint lower abdominal pain; constipation  Triage Note Pt BIB EMS from home. When EMS arrived pt was on the floor. Pt did not fall he crawled to the floor in fetal position. Pt reports lower abdominal pain and constipation x2 weeks. Was given laxative yesterday but unsuccessful. Family reports blood in urine of last 2 months, pt does have bladder tumor he is supposed to have surgery but has been delayed.   152/72 HR 76 RR 20 96% CBG 88   Allergies No Known Allergies  Level of Care/Admitting Diagnosis ED Disposition    ED Disposition Condition [facility] [facility] Area: [place] [[id number]]  Level of Care: Med-Surg [16]  Covid Evaluation: N/A  Diagnosis: Hematuria [[id number]]  Admitting Physician: Tawni Millers [[id number]]  Attending Physician: Tawni Millers [[id number]]  Estimated length of stay: 3 - 4 days  Certification:: I certify this patient will need inpatient services for at least 2 midnights  PT Class (Do Not Modify): Inpatient [101]  PT Acc Code (Do Not Modify): Private [1]       B Medical/Surgery History Past Medical History:  Diagnosis Date  . Adenomatous polyps 09/09/2008  . Arthritis   . Carpal tunnel syndrome, bilateral 11/21/2016  . Colon cancer ([place]) dx'd 2008   surg only  . Diabetes mellitus without complication ([place])   . Falls frequently   . Hypertension   . Prostate cancer ([place]) dx'd 1993   surg only   Past Surgical History:  Procedure Laterality Date  . CATARACT EXTRACTION W/PHACO Left 06/10/2013   Procedure: CATARACT EXTRACTION PHACO AND INTRAOCULAR LENS PLACEMENT (IOC);  Surgeon: Adonis Brook, MD;  Location: [facility];  Service: Ophthalmology;  Laterality: Left;  . COLONOSCOPY    . ESOPHAGOGASTRODUODENOSCOPY    . EYE SURGERY     cataract surgery, right eye  . laparoscopic assisted right hemicolectomy  09/21/07     A IV Location/Drains/Wounds Patient Lines/Drains/Airways Status   Active Line/Drains/Airways    Name:   Placement date:   Placement time:   Site:   Days:   Peripheral IV 02/13/19 Anterior;Left Forearm   02/13/19    1410    Forearm   less than 1          Intake/Output Last 24 hours No intake or output data in the 24 hours ending 02/13/19 1726  Labs/Imaging Results for orders placed or performed during the hospital encounter of 02/13/19 (from the past 48 hour(s))  Urinalysis, Routine w reflex microscopic     Status: Abnormal   Collection Time: 02/13/19 12:29 PM  Result Value Ref Range   Color, Urine YELLOW YELLOW   APPearance CLOUDY (A) CLEAR   Specific Gravity, Urine 1.010 1.005 - 1.030   pH 8.0 5.0 - 8.0   Glucose, UA NEGATIVE NEGATIVE mg/dL   Hgb urine dipstick LARGE (A) NEGATIVE   Bilirubin Urine NEGATIVE NEGATIVE   Ketones, ur NEGATIVE NEGATIVE mg/dL   Protein, ur 100 (A) NEGATIVE mg/dL   Nitrite POSITIVE (A) NEGATIVE   Leukocytes,Ua LARGE (A) NEGATIVE   RBC / HPF >50 (H) 0 - 5 RBC/hpf   WBC, UA >50 (H) 0 - 5 WBC/hpf  Bacteria, UA MANY (A) NONE SEEN   Mucus PRESENT     Comment: Performed at [facility], [place] [address] [place], [place] [address]  Comprehensive metabolic panel     Status: Abnormal   Collection Time: 02/13/19  2:18 PM  Result Value Ref Range   Sodium 142 135 - 145 mmol/L   Potassium 4.2 3.5 - 5.1 mmol/L   Chloride 111 98 - 111 mmol/L   CO2 22 22 - 32 mmol/L   Glucose, Bld 74 70 - 99 mg/dL   BUN 32 (H) 8 - 23 mg/dL   Creatinine, Ser 2.41 (H) 0.61 - 1.24 mg/dL   Calcium 9.3 8.9 - 10.3 mg/dL   Total Protein 7.4 6.5 - 8.1 g/dL   Albumin 3.4 (L) 3.5 - 5.0 g/dL   AST 20 15 - 41 U/L   ALT 15 0 - 44 U/L    Alkaline Phosphatase 98 38 - 126 U/L   Total Bilirubin 0.7 0.3 - 1.2 mg/dL   GFR calc non Af Amer 22 (L) >60 mL/min   GFR calc Af Amer 26 (L) >60 mL/min   Anion gap 9 5 - 15    Comment: Performed at [facility], [place] [address], [place], [place] [address]  CBC with Differential     Status: Abnormal   Collection Time: 02/13/19  2:18 PM  Result Value Ref Range   WBC 4.5 4.0 - 10.5 K/uL   RBC 4.16 (L) 4.22 - 5.81 MIL/uL   Hemoglobin 11.0 (L) 13.0 - 17.0 g/dL   HCT 34.4 (L) 39.0 - 52.0 %   MCV 82.7 80.0 - 100.0 fL   MCH 26.4 26.0 - 34.0 pg   MCHC 32.0 30.0 - 36.0 g/dL   RDW 14.8 11.5 - 15.5 %   Platelets 228 150 - 400 K/uL   nRBC 0.0 0.0 - 0.2 %   Neutrophils Relative % 49 %   Neutro Abs 2.2 1.7 - 7.7 K/uL   Lymphocytes Relative 37 %   Lymphs Abs 1.7 0.7 - 4.0 K/uL   Monocytes Relative 9 %   Monocytes Absolute 0.4 0.1 - 1.0 K/uL   Eosinophils Relative 4 %   Eosinophils Absolute 0.2 0.0 - 0.5 K/uL   Basophils Relative 1 %   Basophils Absolute 0.1 0.0 - 0.1 K/uL   Immature Granulocytes 0 %   Abs Immature Granulocytes 0.01 0.00 - 0.07 K/uL    Comment: Performed at [facility], [place] [address] [place],  [address]   Ct Abdomen Pelvis Wo Contrast  Result Date: 02/13/2019 CLINICAL DATA:  Acute lower abdominal pain, constipation. EXAM: CT ABDOMEN AND PELVIS WITHOUT CONTRAST TECHNIQUE: Multidetector CT imaging of the abdomen and pelvis was performed following the standard protocol without IV contrast. COMPARISON:  CT scan of November 07, 2018. FINDINGS: Lower chest: No acute abnormality. Hepatobiliary: No focal liver abnormality is seen. No gallstones, gallbladder wall thickening, or biliary dilatation. Pancreas: 4.1 x 2.1 cm cystic lesion is seen involving uncinate process of the pancreas which is not significantly changed compared to prior exam. No ductal dilatation or inflammation is noted. Spleen: Normal in size without focal abnormality.  Adrenals/Urinary Tract: Adrenal glands appear normal. Bilateral nephrolithiasis is noted. Moderate right hydronephrosis is noted which appears to be due to obstruction from large lobulated bladder mass. No hydronephrosis or renal obstruction is noted on the left. The bladder mass is highly concerning for bladder malignancy or possibly extension from prostate gland. Stomach/Bowel: The stomach  is unremarkable. Postsurgical changes are seen involving the right colon. There is no evidence of bowel obstruction or inflammation. Vascular/Lymphatic: Aortic atherosclerosis. No enlarged abdominal or pelvic lymph nodes. Reproductive: Enlarged prostate gland is noted and potentially extending into inferior portion of urinary bladder. Other: No abdominal wall hernia or abnormality. No abdominopelvic ascites. Musculoskeletal: Stable sclerotic lesions are seen involving the left iliac wing posteriorly as well as the L5 vertebral body consistent with metastatic disease. IMPRESSION: Continued presence of large multilobulated mass within the urinary bladder concerning for bladder carcinoma or potentially extension of enlarged prostate gland or prostate carcinoma. This results in moderate right hydroureteronephrosis which is increased compared to prior exam. Bilateral nephrolithiasis is noted. Stable sclerotic lesions are seen in the lumbar spine and left iliac bone concerning for metastatic disease. Stable 4.1 x 2.1 cm cystic lesion seen involving the uncinate process of the pancreas most likely representing low grade neoplasm or benign lesion. Follow-up with abdominal MRI with and without gadolinium may be performed for further evaluation on nonemergent basis. Electronically Signed   By: Marijo Conception M.D.   On: 02/13/2019 15:34    Pending Labs Unresulted Labs (From admission, onward)    Start     Ordered   02/13/19 1229  Urine culture  ONCE - STAT,   STAT     02/13/19 1229   Signed and Held  Basic metabolic panel   Tomorrow morning,   R    Question:  Specimen collection method  Answer:  IV Team=IV Team collect   Signed and Held   Signed and Held  CBC  Tomorrow morning,   R    Question:  Specimen collection method  Answer:  IV Team=IV Team collect   Signed and Held          Vitals/Pain Today's Vitals   02/13/19 1256 02/13/19 1330 02/13/19 1500 02/13/19 1705  BP: (!) 165/75 (!) 156/67 (!) 148/60 (!) 144/52  Pulse: (!) 57 (!) 52 (!) 49 (!) 55  Resp: 20 19 20  (!) 21  Temp:      TempSrc:      SpO2: 100% 100% 98% 98%  PainSc:        Isolation Precautions No active isolations  Medications Medications  cefTRIAXone (ROCEPHIN) 1 g in sodium chloride 0.9 % 100 mL IVPB (0 g Intravenous Stopped 02/13/19 1646)  acetaminophen (TYLENOL) tablet 650 mg (650 mg Oral Given 02/13/19 1717)  oxybutynin (DITROPAN) tablet 2.5 mg (2.5 mg Oral Given 02/13/19 1717)    Mobility walks with device Moderate fall risk   Focused Assessments   Lung sounds:         ,     R Recommendations: See Admitting Provider Note  Report given to:    Additional Notes:

## 2019-02-13 NOTE — ED Notes (Signed)
Transport called to transport patient to the fifth floor.

## 2019-02-13 NOTE — ED Provider Notes (Signed)
[facility] DEPT Provider Note   CSN: [id number] Arrival date & time: 02/13/19  1158    History   Chief Complaint Chief Complaint  Patient presents with  . Abdominal Pain  . Constipation    HPI [name] is a 85 y.o. male.     The history is provided by the patient, medical records, the EMS personnel and a relative. No language interpreter was used.  Abdominal Pain  Associated symptoms: constipation   Constipation  Associated symptoms: abdominal pain    [name] is a 85 y.o. male who presents to the Emergency Department complaining of abdominal pain. He complains of abdominal pain that is been intermittent for the last few weeks. Today he was crawling on the floor secondary to severe pain. He has no current pain on ED evaluation.  Per [name] (daughter) - he has been complaining of abdominal pain.  He has bloody urine and black stool for the last few weeks.  No BM for a few days.  No fevers, vomiting, cough/difficulty breathing.  No COVID exposures.  Lives with [place]. Symptoms are severe in nature. Past Medical History:  Diagnosis Date  . Adenomatous polyps 09/09/2008  . Arthritis   . Carpal tunnel syndrome, bilateral 11/21/2016  . Colon cancer ([place]) dx'd 2008   surg only  . Diabetes mellitus without complication ([place])   . Falls frequently   . Hypertension   . Prostate cancer ([place]) dx'd 1993   surg only    Patient Active Problem List   Diagnosis Date Noted  . Diastolic CHF ([place]) 65/99/3570  . Acute CHF (congestive heart failure) ([place]) 12/02/2017  . Hypothermia 12/02/2017  . Dyspnea 12/02/2017  . CHF (congestive heart failure) ([place]) 12/02/2017  . Carpal tunnel syndrome, bilateral 11/21/2016  . Falls frequently   . Vertigo 05/20/2014  . PROSTATE CANCER 08/19/2008  . DM 08/19/2008  . HYPERLIPIDEMIA 08/19/2008  . Essential hypertension 08/19/2008  . Personal history of colon cancer, stage II 08/19/2008    Past Surgical  History:  Procedure Laterality Date  . CATARACT EXTRACTION W/PHACO Left 06/10/2013   Procedure: CATARACT EXTRACTION PHACO AND INTRAOCULAR LENS PLACEMENT (IOC);  Surgeon: Adonis Brook, MD;  Location: [facility];  Service: Ophthalmology;  Laterality: Left;  . COLONOSCOPY    . ESOPHAGOGASTRODUODENOSCOPY    . EYE SURGERY     cataract surgery, right eye  . laparoscopic assisted right hemicolectomy  09/21/07        Home Medications    Prior to Admission medications   Medication Sig Start Date End Date Taking? Authorizing Provider  amLODipine (NORVASC) 10 MG tablet Take 10 mg by mouth daily.    [provider]  aspirin EC 81 MG tablet Take 1 tablet (81 mg total) by mouth daily. 05/22/14   Reyne Dumas, MD  citalopram (CELEXA) 10 MG tablet Take 10 mg by mouth daily. 11/24/17   [provider]  ENTRESTO 24-26 MG Take 1 tablet by mouth 2 (two) times daily. 11/17/18   [provider]  furosemide (LASIX) 40 MG tablet Take 1 tablet (40 mg total) by mouth daily for 30 days. 12/22/18 01/21/19  Donne Hazel, MD  neomycin-polymyxin-hydrocortisone (CORTISPORIN) 3.5-10000-1 ophthalmic suspension Place 4 drops into both eyes 4 (four) times daily. 12/21/18   Donne Hazel, MD    Family History Family History  Problem Relation Age of Onset  . Diabetes Other     Social History Social History   Tobacco Use  . Smoking status: Former  Smoker    Years: 1.00    Types: Cigarettes    Last attempt to quit: 06/10/1983    Years since quitting: 35.7  . Smokeless tobacco: Never Used  Substance Use Topics  . Alcohol use: Yes    Comment: occasional wine  . Drug use: No     Allergies   Patient has no known allergies.   Review of Systems Review of Systems  Gastrointestinal: Positive for abdominal pain and constipation.  All other systems reviewed and are negative.    Physical Exam Updated Vital Signs BP (!) 180/74   Pulse (!) 56   Temp 97.7 F (36.5 C) (Oral)   Resp 16    SpO2 100%   Physical Exam Vitals signs and nursing note reviewed.  Constitutional:      Appearance: He is well-developed.  HENT:     Head: Normocephalic and atraumatic.  Cardiovascular:     Rate and Rhythm: Normal rate and regular rhythm.  Pulmonary:     Effort: Pulmonary effort is normal. No respiratory distress.  Abdominal:     Palpations: Abdomen is soft.     Tenderness: There is no abdominal tenderness. There is no guarding or rebound.  Genitourinary:    Penis: Normal.      Comments: Small amount of blood in undergarments. Dark brown stool in rectal vault, heme negative Musculoskeletal:        General: No tenderness.  Skin:    General: Skin is warm and dry.  Neurological:     Mental Status: He is alert and oriented to person, place, and time.  Psychiatric:        Behavior: Behavior normal.      ED Treatments / Results  Labs (all labs ordered are listed, but only abnormal results are displayed) Labs Reviewed - No data to display  EKG None  Radiology No results found.  Procedures Procedures (including critical care time)  Medications Ordered in ED Medications - No data to display   Initial Impression / Assessment and Plan / ED Course  I have reviewed the triage vital signs and the nursing notes.  Pertinent labs & imaging results that were available during my care of the patient were reviewed by me and considered in my medical decision making (see chart for details).        Patient with history of bladder tumor coming from home for evaluation of intermittent abdominal pain, hematuria. On ED arrival patient with no abdominal pain or tenderness. He did have recurrent intermittent pain during his ED stay. UA is concerning for UTI, he was treated with antibiotics. CT scan obtained, which demonstrates stable bladder tumor when compared to prior images. Labs demonstrate stable renal insufficiency. Discussed with patient and daughter findings of studies. Plan to  admit for observation due to ongoing intermittent pain that is poorly controlled at home. Medicine consulted for admission.  Final Clinical Impressions(s) / ED Diagnoses   Final diagnoses:  None    ED Discharge Orders    None       Quintella Reichert, MD 02/13/19 1706

## 2019-02-13 NOTE — ED Notes (Signed)
Attempted to place IV-unsuccessful, MD notified

## 2019-02-13 NOTE — ED Notes (Signed)
2 failed attempts to collect labds

## 2019-02-13 NOTE — ED Triage Notes (Signed)
Pt BIB EMS from home. When EMS arrived pt was on the floor. Pt did not fall he crawled to the floor in fetal position. Pt reports lower abdominal pain and constipation x2 weeks. Was given laxative yesterday but unsuccessful. Family reports blood in urine of last 2 months, pt does have bladder tumor he is supposed to have surgery but has been delayed.   152/72 HR 76 RR 20 96% CBG 88

## 2019-02-13 NOTE — ED Notes (Signed)
Bed: JJ94 Expected date:  Expected time:  Means of arrival:  Comments: EMS-constipation

## 2019-02-13 NOTE — H&P (Addendum)
History and Physical    [name] VXY:[id number] DOB: 04-Nov-1923 DOA: 02/13/2019  PCP: Lucianne Lei, MD   Patient coming from: Home   Chief Complaint: dysuria and hematuria.   HPI: [name] is a 85 y.o. male with medical history significant of diastolic heart failure, CKD 3B , hypertension, history of prostate cancer and colon cancer.  He reports 7 days of dysuria, consistent with sharp pain, 10 out of 10 in intensity, specifically at the lower abdomen and perineum, mainly while urinating or moving his bowels.  It has been associated with hematuria, fatigue, bowel incontinence and generalized weakness.  No improving factors. His symptoms have been persistent for the last 7 days.  His prostate cancer he has been treated with supportive care and androgen deprivation.  Follows with Dr. Alen Blew and Dr. Alyson Ingles.   Patient ambulates with the help of the walker and he lives with his daughter.   ED Course: He was found deconditioning and ill looking appearing, also has had hematuria, CT of the abdomen and pelvis showed a large multilobulated mass within the urinary bladder, concerning for bladder carcinoma potentially extension of enlarged prostate gland/prostate carcinoma.  He was referred for admission and further medical management.  Review of Systems:  1. General: No fevers, but positive chills, no weight gain or weight loss 2. ENT: No runny nose or sore throat, no hearing disturbances 3. Pulmonary: No dyspnea, cough, wheezing, or hemoptysis 4. Cardiovascular: No angina, claudication, lower extremity edema, pnd or orthopnea 5. Gastrointestinal: No nausea or vomiting, no diarrhea or constipation. Positive bowel incontinence.  6. Hematology: No easy bruisability or frequent infections 7. Urology: positive dysuria, hematuria and increased urinary frequency 8. Dermatology: No rashes. 9. Neurology: No seizures or paresthesias 10. Musculoskeletal: No joint pain or deformities  Past  Medical History:  Diagnosis Date   Adenomatous polyps 09/09/2008   Arthritis    Carpal tunnel syndrome, bilateral 11/21/2016   Colon cancer ([place]) dx'd 2008   surg only   Diabetes mellitus without complication ([place])    Falls frequently    Hypertension    Prostate cancer ([place]) dx'd 1993   surg only    Past Surgical History:  Procedure Laterality Date   CATARACT EXTRACTION W/PHACO Left 06/10/2013   Procedure: CATARACT EXTRACTION PHACO AND INTRAOCULAR LENS PLACEMENT ([place]);  Surgeon: Adonis Brook, MD;  Location: [facility];  Service: Ophthalmology;  Laterality: Left;   COLONOSCOPY     ESOPHAGOGASTRODUODENOSCOPY     EYE SURGERY     cataract surgery, right eye   laparoscopic assisted right hemicolectomy  09/21/07     reports that he quit smoking about 35 years ago. His smoking use included cigarettes. He quit after 1.00 year of use. He has never used smokeless tobacco. He reports current alcohol use. He reports that he does not use drugs.  No Known Allergies  Family History  Problem Relation Age of Onset   Diabetes Other      Prior to Admission medications   Medication Sig Start Date End Date Taking? Authorizing Provider  amLODipine (NORVASC) 10 MG tablet Take 10 mg by mouth daily.   Yes [provider]  aspirin EC 81 MG tablet Take 1 tablet (81 mg total) by mouth daily. 05/22/14  Yes Reyne Dumas, MD  citalopram (CELEXA) 10 MG tablet Take 10 mg by mouth daily. 11/24/17  Yes [provider]  ENTRESTO 24-26 MG Take 1 tablet by mouth 2 (two) times daily. 11/17/18  Yes [provider]  furosemide (LASIX)  40 MG tablet Take 1 tablet (40 mg total) by mouth daily for 30 days. Patient taking differently: Take 40 mg by mouth daily as needed for fluid or edema.  12/22/18 02/13/19 Yes Donne Hazel, MD  neomycin-polymyxin-hydrocortisone (CORTISPORIN) 3.5-10000-1 ophthalmic suspension Place 4 drops into both eyes 4 (four) times daily. 12/21/18  Yes Donne Hazel,  MD  polyethylene glycol (MIRALAX / GLYCOLAX) 17 g packet Take 17 g by mouth daily as needed for mild constipation.   Yes [provider]    Physical Exam: Vitals:   02/13/19 1226 02/13/19 1256 02/13/19 1330 02/13/19 1500  BP:  (!) 165/75 (!) 156/67 (!) 148/60  Pulse:  (!) 57 (!) 52 (!) 49  Resp:  20 19 20   Temp: 97.7 F (36.5 C)     TempSrc: Rectal     SpO2:  100% 100% 98%    Vitals:   02/13/19 1226 02/13/19 1256 02/13/19 1330 02/13/19 1500  BP:  (!) 165/75 (!) 156/67 (!) 148/60  Pulse:  (!) 57 (!) 52 (!) 49  Resp:  20 19 20   Temp: 97.7 F (36.5 C)     TempSrc: Rectal     SpO2:  100% 100% 98%   General: deconditioned and ill looking appearing.  Neurology: Awake and alert, non focal Head and Neck. Head normocephalic. Neck supple with no adenopathy or thyromegaly.   E ENT: positive pallor, no icterus, oral mucosa dry.  Cardiovascular: No JVD. S1-S2 present, rhythmic, no gallops, rubs, or murmurs. No lower extremity edema. Pulmonary: positive breath sounds bilaterally, adequate air movement, no wheezing, rhonchi or rales. Gastrointestinal. Abdomen with no organomegaly, non tender, no rebound or guarding Skin. No rashes Musculoskeletal: no joint deformities    Labs on Admission: I have personally reviewed following labs and imaging studies  CBC: Recent Labs  Lab 02/13/19 1418  WBC 4.5  NEUTROABS 2.2  HGB 11.0*  HCT 34.4*  MCV 82.7  PLT 381   Basic Metabolic Panel: Recent Labs  Lab 02/13/19 1418  NA 142  K 4.2  CL 111  CO2 22  GLUCOSE 74  BUN 32*  CREATININE 2.41*  CALCIUM 9.3   GFR: CrCl cannot be calculated (Unknown ideal weight.). Liver Function Tests: Recent Labs  Lab 02/13/19 1418  AST 20  ALT 15  ALKPHOS 98  BILITOT 0.7  PROT 7.4  ALBUMIN 3.4*   No results for input(s): LIPASE, AMYLASE in the last 168 hours. No results for input(s): AMMONIA in the last 168 hours. Coagulation Profile: No results for input(s): INR, PROTIME in the  last 168 hours. Cardiac Enzymes: No results for input(s): CKTOTAL, CKMB, CKMBINDEX, TROPONINI in the last 168 hours. BNP (last 3 results) No results for input(s): PROBNP in the last 8760 hours. HbA1C: No results for input(s): HGBA1C in the last 72 hours. CBG: No results for input(s): GLUCAP in the last 168 hours. Lipid Profile: No results for input(s): CHOL, HDL, LDLCALC, TRIG, CHOLHDL, LDLDIRECT in the last 72 hours. Thyroid Function Tests: No results for input(s): TSH, T4TOTAL, FREET4, T3FREE, THYROIDAB in the last 72 hours. Anemia Panel: No results for input(s): VITAMINB12, FOLATE, FERRITIN, TIBC, IRON, RETICCTPCT in the last 72 hours. Urine analysis:    Component Value Date/Time   COLORURINE YELLOW 02/13/2019 1229   APPEARANCEUR CLOUDY (A) 02/13/2019 1229   LABSPEC 1.010 02/13/2019 1229   PHURINE 8.0 02/13/2019 1229   GLUCOSEU NEGATIVE 02/13/2019 1229   HGBUR LARGE (A) 02/13/2019 1229   BILIRUBINUR NEGATIVE 02/13/2019 1229   KETONESUR  NEGATIVE 02/13/2019 1229   PROTEINUR 100 (A) 02/13/2019 1229   UROBILINOGEN 1.0 04/07/2010 1747   NITRITE POSITIVE (A) 02/13/2019 1229   LEUKOCYTESUR LARGE (A) 02/13/2019 1229    Radiological Exams on Admission: Ct Abdomen Pelvis Wo Contrast  Result Date: 02/13/2019 CLINICAL DATA:  Acute lower abdominal pain, constipation. EXAM: CT ABDOMEN AND PELVIS WITHOUT CONTRAST TECHNIQUE: Multidetector CT imaging of the abdomen and pelvis was performed following the standard protocol without IV contrast. COMPARISON:  CT scan of November 07, 2018. FINDINGS: Lower chest: No acute abnormality. Hepatobiliary: No focal liver abnormality is seen. No gallstones, gallbladder wall thickening, or biliary dilatation. Pancreas: 4.1 x 2.1 cm cystic lesion is seen involving uncinate process of the pancreas which is not significantly changed compared to prior exam. No ductal dilatation or inflammation is noted. Spleen: Normal in size without focal abnormality.  Adrenals/Urinary Tract: Adrenal glands appear normal. Bilateral nephrolithiasis is noted. Moderate right hydronephrosis is noted which appears to be due to obstruction from large lobulated bladder mass. No hydronephrosis or renal obstruction is noted on the left. The bladder mass is highly concerning for bladder malignancy or possibly extension from prostate gland. Stomach/Bowel: The stomach is unremarkable. Postsurgical changes are seen involving the right colon. There is no evidence of bowel obstruction or inflammation. Vascular/Lymphatic: Aortic atherosclerosis. No enlarged abdominal or pelvic lymph nodes. Reproductive: Enlarged prostate gland is noted and potentially extending into inferior portion of urinary bladder. Other: No abdominal wall hernia or abnormality. No abdominopelvic ascites. Musculoskeletal: Stable sclerotic lesions are seen involving the left iliac wing posteriorly as well as the L5 vertebral body consistent with metastatic disease. IMPRESSION: Continued presence of large multilobulated mass within the urinary bladder concerning for bladder carcinoma or potentially extension of enlarged prostate gland or prostate carcinoma. This results in moderate right hydroureteronephrosis which is increased compared to prior exam. Bilateral nephrolithiasis is noted. Stable sclerotic lesions are seen in the lumbar spine and left iliac bone concerning for metastatic disease. Stable 4.1 x 2.1 cm cystic lesion seen involving the uncinate process of the pancreas most likely representing low grade neoplasm or benign lesion. Follow-up with abdominal MRI with and without gadolinium may be performed for further evaluation on nonemergent basis. Electronically Signed   By: Marijo Conception M.D.   On: 02/13/2019 15:34    EKG: Independently reviewed. NA  Assessment/Plan Active Problems:   PROSTATE CANCER   Essential hypertension   Diastolic CHF (HCC)   Hematuria   AKI (acute kidney injury) ([place])   CKD stage  G3b/A2, GFR 30-44 and albumin creatinine ratio 30-299 mg/g ([place])  85 year old male with a significant past medical history for heart failure, CKD stage 3 B, hypertension and prostate cancer who presents with 7 days of persistent dysuria, consistent with lower abdominal and perineal pain, with hematuria, generalized weakness and deconditioning.  On his initial physical examination his blood pressure is 156/67, heart rate 52, respiratory rate 19, oxygen saturation 98%.  His lungs are clear to auscultation bilaterally, heart S1-S2 present, and rhythmic, no gallops, rubs or murmurs, the abdomen is soft, no lower extremity edema.  Sodium 142, potassium 4.2, chloride 111, bicarb 22, glucose 74, BUN 32, creatinine 2.41, white count 4.5, hemoglobin 11.0, hematocrit 34.4, platelets 228.  His urinalysis had 100 protein, specific gravity 1.010, more than 50 white cells, more than 50 red cells.  CT of the abdomen and pelvis showing large multilobulated mass within the urinary bladder concerning for bladder carcinoma or extension of prostate gland/prostate carcinoma.  Moderate  right hydroureteronephrosis, bilateral nephrolithiasis, stable sclerotic lesions in the lumbar spine and left iliac bone concerning for metastatic disease.  Stable 4.1 x 2.1 cm cystic lesion involving the uncinate process of the pancreas.   Patient was admitted to the hospital with working diagnosis of progressive prostate cancer complicated with obstructive uropathy and urinary tract infection.  1.  Progressive prostate cancer complicated by obstructive uropathy, urinary tract infection and acute kidney injury on CKD stage 3B. Patient will be admitted to the medical ward, will start antibiotic therapy with IV ceftriaxone, will follow-up on cultures, cell count and temperature curve.  Gentle hydration with isotonic saline.  Will follow-up on kidney function in the morning, avoid hypotension and nephrotoxic agents.  His calculated GFR is 33,  consistent with chronic kidney disease stage III.  Will consult urology in the morning for further recommendations in regards of progressive prostate cancer.  Will continue urine output monitoring, will add Flomax and Oxybutynin.   2.  Diastolic heart failure.  Ejection fraction left ventricle 60-65%.  Moderate focal basal hypertrophy of the septum.  February 2019.  Patient hemodynamically stable, mildly hypovolemic, will hold on further diuresis, Entresto, but will continue amlodipine.  Gentle hydration with isotonic saline.   3.  Hypertension.  Will continue blood pressure control with amlodipine, hold Entresto and diuretics for now. Hold aspirin due to hematuria.   4.  Depression.  Continue citalopram.  DVT prophylaxis: scd  Code Status: full  Family Communication: no family at the bedside   Disposition Plan: med surg   Consults called: none   Admission status: Inpatient.     Spiros Greenfeld Gerome Apley MD [facility]   02/13/2019, 4:51 PM

## 2019-02-13 NOTE — ED Notes (Signed)
IV team remains at bedside 

## 2019-02-13 NOTE — ED Notes (Signed)
Unsuccessful IV attempt x1. 

## 2019-02-13 NOTE — ED Notes (Signed)
ED Provider at bedside. 

## 2019-02-14 DIAGNOSIS — I10 Essential (primary) hypertension: Secondary | ICD-10-CM

## 2019-02-14 DIAGNOSIS — B964 Proteus (mirabilis) (morganii) as the cause of diseases classified elsewhere: Secondary | ICD-10-CM

## 2019-02-14 DIAGNOSIS — N390 Urinary tract infection, site not specified: Secondary | ICD-10-CM

## 2019-02-14 LAB — CBC
HCT: 34 % — ABNORMAL LOW (ref 39.0–52.0)
Hemoglobin: 10.5 g/dL — ABNORMAL LOW (ref 13.0–17.0)
MCH: 26.1 pg (ref 26.0–34.0)
MCHC: 30.9 g/dL (ref 30.0–36.0)
MCV: 84.4 fL (ref 80.0–100.0)
Platelets: 216 10*3/uL (ref 150–400)
RBC: 4.03 MIL/uL — ABNORMAL LOW (ref 4.22–5.81)
RDW: 15 % (ref 11.5–15.5)
WBC: 3.3 10*3/uL — ABNORMAL LOW (ref 4.0–10.5)
nRBC: 0 % (ref 0.0–0.2)

## 2019-02-14 LAB — BASIC METABOLIC PANEL
Anion gap: 8 (ref 5–15)
BUN: 31 mg/dL — ABNORMAL HIGH (ref 8–23)
CO2: 22 mmol/L (ref 22–32)
Calcium: 9 mg/dL (ref 8.9–10.3)
Chloride: 111 mmol/L (ref 98–111)
Creatinine, Ser: 2.25 mg/dL — ABNORMAL HIGH (ref 0.61–1.24)
GFR calc Af Amer: 28 mL/min — ABNORMAL LOW (ref >60)
GFR calc non Af Amer: 24 mL/min — ABNORMAL LOW (ref >60)
Glucose, Bld: 98 mg/dL (ref 70–99)
Potassium: 4.2 mmol/L (ref 3.5–5.1)
Sodium: 141 mmol/L (ref 135–145)

## 2019-02-14 NOTE — Consult Note (Signed)
Urology Consult   Physician requesting consult: Dr. Alfredia Ferguson  Reason for consult: Bladder mass, hematuria  History of Present Illness: [name] is a 85 y.o. with castrate resistant metastatic prostate cancer followed by Dr. Alyson Ingles and most recently seen on 4/14.  He received Lupron that day.  He apparently has missed follow up appointments with Dr. Alen Blew to discuss proceeding with other therapy.  He has previously taken enzalutamide and apparently now failed that treatment.  There was discussion about starting radium-223.  He underwent cystoscopy also on 4/14 and has a bladder tumor that is concerning and is apparently in the process of being scheduled for cystoscopy and transurethral resection.  Unclear if this is locally advanced prostate cancer or urothelial primary tumor or other.  He noted gross hematuria and has been having dysuria and lower abdominal pain. Urine culture with > 100,000 Proteus growing.    Past Medical History:  Diagnosis Date  . Adenomatous polyps 09/09/2008  . Arthritis   . Carpal tunnel syndrome, bilateral 11/21/2016  . Colon cancer ([place]) dx'd 2008   surg only  . Diabetes mellitus without complication ([place])   . Falls frequently   . Hypertension   . Prostate cancer ([place]) dx'd 1993   surg only    Past Surgical History:  Procedure Laterality Date  . CATARACT EXTRACTION W/PHACO Left 06/10/2013   Procedure: CATARACT EXTRACTION PHACO AND INTRAOCULAR LENS PLACEMENT (IOC);  Surgeon: Adonis Brook, MD;  Location: [facility];  Service: Ophthalmology;  Laterality: Left;  . COLONOSCOPY    . ESOPHAGOGASTRODUODENOSCOPY    . EYE SURGERY     cataract surgery, right eye  . laparoscopic assisted right hemicolectomy  09/21/07    Medications:  Home meds:  No current facility-administered medications on file prior to encounter.    Current Outpatient Medications on File Prior to Encounter  Medication Sig Dispense Refill  . amLODipine (NORVASC) 10 MG tablet Take 10 mg by  mouth daily.    Marland Kitchen aspirin EC 81 MG tablet Take 1 tablet (81 mg total) by mouth daily. 60 tablet 0  . citalopram (CELEXA) 10 MG tablet Take 10 mg by mouth daily.    Marland Kitchen ENTRESTO 24-26 MG Take 1 tablet by mouth 2 (two) times daily.    . furosemide (LASIX) 40 MG tablet Take 1 tablet (40 mg total) by mouth daily for 30 days. (Patient taking differently: Take 40 mg by mouth daily as needed for fluid or edema. ) 30 tablet 0  . neomycin-polymyxin-hydrocortisone (CORTISPORIN) 3.5-10000-1 ophthalmic suspension Place 4 drops into both eyes 4 (four) times daily. 7.5 mL 0  . polyethylene glycol (MIRALAX / GLYCOLAX) 17 g packet Take 17 g by mouth daily as needed for mild constipation.       Scheduled Meds: . amLODipine  10 mg Oral Daily  . citalopram  10 mg Oral Daily  . neomycin-polymyxin b-dexamethasone  1 drop Both Eyes Q4H  . oxybutynin  2.5 mg Oral TID  . tamsulosin  0.4 mg Oral Daily   Continuous Infusions: . sodium chloride 75 mL/hr at 02/14/19 0848  . cefTRIAXone (ROCEPHIN)  IV     PRN Meds:.acetaminophen **OR** acetaminophen, ondansetron **OR** ondansetron (ZOFRAN) IV, polyethylene glycol  Allergies: No Known Allergies  Family History  Problem Relation Age of Onset  . Diabetes Other     Social History:  reports that he quit smoking about 35 years ago. His smoking use included cigarettes. He quit after 1.00 year of use. He has never used smokeless tobacco. He  reports current alcohol use. He reports that he does not use drugs.  ROS: A complete review of systems was performed.  All systems are negative except for pertinent findings as noted.  Physical Exam:  Vital signs in last 24 hours: Temp:  [97.3 F (36.3 C)-97.7 F (36.5 C)] 97.7 F (36.5 C) (04/25 0515) Pulse Rate:  [47-57] 52 (04/25 0515) Resp:  [16-21] 16 (04/25 0515) BP: (126-180)/(51-75) 126/52 (04/25 0846) SpO2:  [98 %-100 %] 99 % (04/25 0515) Weight:  [83 kg] 83 kg (04/24 1804) Constitutional:  Alert and oriented, No  acute distress Cardiovascular: No JVD Respiratory: Normal respiratory effort GI: Abdomen is soft, nontender, nondistended, no abdominal masses Genitourinary: No CVAT. Normal male phallus, testes are descended bilaterally and non-tender and without masses, scrotum is normal in appearance without lesions or masses, perineum is normal on inspection. Condom catheter in place with grossly clear urine draining. Neurologic: Grossly intact, no focal deficits Psychiatric: Normal mood and affect  Laboratory Data:  Recent Labs    02/13/19 1418 02/14/19 0406  WBC 4.5 3.3*  HGB 11.0* 10.5*  HCT 34.4* 34.0*  PLT 228 216    Recent Labs    02/13/19 1418 02/14/19 0406  NA 142 141  K 4.2 4.2  CL 111 111  GLUCOSE 74 98  BUN 32* 31*  CALCIUM 9.3 9.0  CREATININE 2.41* 2.25*     Results for orders placed or performed during the hospital encounter of 02/13/19 (from the past 24 hour(s))  Urinalysis, Routine w reflex microscopic     Status: Abnormal   Collection Time: 02/13/19 12:29 PM  Result Value Ref Range   Color, Urine YELLOW YELLOW   APPearance CLOUDY (A) CLEAR   Specific Gravity, Urine 1.010 1.005 - 1.030   pH 8.0 5.0 - 8.0   Glucose, UA NEGATIVE NEGATIVE mg/dL   Hgb urine dipstick LARGE (A) NEGATIVE   Bilirubin Urine NEGATIVE NEGATIVE   Ketones, ur NEGATIVE NEGATIVE mg/dL   Protein, ur 100 (A) NEGATIVE mg/dL   Nitrite POSITIVE (A) NEGATIVE   Leukocytes,Ua LARGE (A) NEGATIVE   RBC / HPF >50 (H) 0 - 5 RBC/hpf   WBC, UA >50 (H) 0 - 5 WBC/hpf   Bacteria, UA MANY (A) NONE SEEN   Mucus PRESENT   Urine culture     Status: Abnormal (Preliminary result)   Collection Time: 02/13/19 12:29 PM  Result Value Ref Range   Specimen Description      URINE, CLEAN CATCH Performed at [facility], [place] [address] [place], [place] [address]    Special Requests      NONE Performed at [facility], [place] [address] [place], [place] [address]    Culture  >=100,000 COLONIES/mL PROTEUS MIRABILIS (A)    Report Status PENDING   Comprehensive metabolic panel     Status: Abnormal   Collection Time: 02/13/19  2:18 PM  Result Value Ref Range   Sodium 142 135 - 145 mmol/L   Potassium 4.2 3.5 - 5.1 mmol/L   Chloride 111 98 - 111 mmol/L   CO2 22 22 - 32 mmol/L   Glucose, Bld 74 70 - 99 mg/dL   BUN 32 (H) 8 - 23 mg/dL   Creatinine, Ser 2.41 (H) 0.61 - 1.24 mg/dL   Calcium 9.3 8.9 - 10.3 mg/dL   Total Protein 7.4 6.5 - 8.1 g/dL   Albumin 3.4 (L) 3.5 - 5.0 g/dL   AST 20 15 - 41 U/L   ALT 15  0 - 44 U/L   Alkaline Phosphatase 98 38 - 126 U/L   Total Bilirubin 0.7 0.3 - 1.2 mg/dL   GFR calc non Af Amer 22 (L) >60 mL/min   GFR calc Af Amer 26 (L) >60 mL/min   Anion gap 9 5 - 15  CBC with Differential     Status: Abnormal   Collection Time: 02/13/19  2:18 PM  Result Value Ref Range   WBC 4.5 4.0 - 10.5 K/uL   RBC 4.16 (L) 4.22 - 5.81 MIL/uL   Hemoglobin 11.0 (L) 13.0 - 17.0 g/dL   HCT 34.4 (L) 39.0 - 52.0 %   MCV 82.7 80.0 - 100.0 fL   MCH 26.4 26.0 - 34.0 pg   MCHC 32.0 30.0 - 36.0 g/dL   RDW 14.8 11.5 - 15.5 %   Platelets 228 150 - 400 K/uL   nRBC 0.0 0.0 - 0.2 %   Neutrophils Relative % 49 %   Neutro Abs 2.2 1.7 - 7.7 K/uL   Lymphocytes Relative 37 %   Lymphs Abs 1.7 0.7 - 4.0 K/uL   Monocytes Relative 9 %   Monocytes Absolute 0.4 0.1 - 1.0 K/uL   Eosinophils Relative 4 %   Eosinophils Absolute 0.2 0.0 - 0.5 K/uL   Basophils Relative 1 %   Basophils Absolute 0.1 0.0 - 0.1 K/uL   Immature Granulocytes 0 %   Abs Immature Granulocytes 0.01 0.00 - 0.07 K/uL  Basic metabolic panel     Status: Abnormal   Collection Time: 02/14/19  4:06 AM  Result Value Ref Range   Sodium 141 135 - 145 mmol/L   Potassium 4.2 3.5 - 5.1 mmol/L   Chloride 111 98 - 111 mmol/L   CO2 22 22 - 32 mmol/L   Glucose, Bld 98 70 - 99 mg/dL   BUN 31 (H) 8 - 23 mg/dL   Creatinine, Ser 2.25 (H) 0.61 - 1.24 mg/dL   Calcium 9.0 8.9 - 10.3 mg/dL   GFR calc non Af  Amer 24 (L) >60 mL/min   GFR calc Af Amer 28 (L) >60 mL/min   Anion gap 8 5 - 15  CBC     Status: Abnormal   Collection Time: 02/14/19  4:06 AM  Result Value Ref Range   WBC 3.3 (L) 4.0 - 10.5 K/uL   RBC 4.03 (L) 4.22 - 5.81 MIL/uL   Hemoglobin 10.5 (L) 13.0 - 17.0 g/dL   HCT 34.0 (L) 39.0 - 52.0 %   MCV 84.4 80.0 - 100.0 fL   MCH 26.1 26.0 - 34.0 pg   MCHC 30.9 30.0 - 36.0 g/dL   RDW 15.0 11.5 - 15.5 %   Platelets 216 150 - 400 K/uL   nRBC 0.0 0.0 - 0.2 %   Recent Results (from the past 240 hour(s))  Urine culture     Status: Abnormal (Preliminary result)   Collection Time: 02/13/19 12:29 PM  Result Value Ref Range Status   Specimen Description   Final    URINE, CLEAN CATCH Performed at [facility], [place] [address], [place], [place] [address]    Special Requests   Final    NONE Performed at [facility], [place] [address] [place], [place] [address]    Culture >=100,000 COLONIES/mL PROTEUS MIRABILIS (A)  Final   Report Status PENDING  Incomplete    Renal Function: Recent Labs    02/13/19 1418 02/14/19 0406  CREATININE 2.41* 2.25*   Estimated Creatinine Clearance:  20.4 mL/min (A) (by C-G formula based on SCr of 2.25 mg/dL (H)).  Radiologic Imaging: Ct Abdomen Pelvis Wo Contrast  Result Date: 02/13/2019 CLINICAL DATA:  Acute lower abdominal pain, constipation. EXAM: CT ABDOMEN AND PELVIS WITHOUT CONTRAST TECHNIQUE: Multidetector CT imaging of the abdomen and pelvis was performed following the standard protocol without IV contrast. COMPARISON:  CT scan of November 07, 2018. FINDINGS: Lower chest: No acute abnormality. Hepatobiliary: No focal liver abnormality is seen. No gallstones, gallbladder wall thickening, or biliary dilatation. Pancreas: 4.1 x 2.1 cm cystic lesion is seen involving uncinate process of the pancreas which is not significantly changed compared to prior exam. No ductal dilatation or inflammation is noted. Spleen: Normal  in size without focal abnormality. Adrenals/Urinary Tract: Adrenal glands appear normal. Bilateral nephrolithiasis is noted. Moderate right hydronephrosis is noted which appears to be due to obstruction from large lobulated bladder mass. No hydronephrosis or renal obstruction is noted on the left. The bladder mass is highly concerning for bladder malignancy or possibly extension from prostate gland. Stomach/Bowel: The stomach is unremarkable. Postsurgical changes are seen involving the right colon. There is no evidence of bowel obstruction or inflammation. Vascular/Lymphatic: Aortic atherosclerosis. No enlarged abdominal or pelvic lymph nodes. Reproductive: Enlarged prostate gland is noted and potentially extending into inferior portion of urinary bladder. Other: No abdominal wall hernia or abnormality. No abdominopelvic ascites. Musculoskeletal: Stable sclerotic lesions are seen involving the left iliac wing posteriorly as well as the L5 vertebral body consistent with metastatic disease. IMPRESSION: Continued presence of large multilobulated mass within the urinary bladder concerning for bladder carcinoma or potentially extension of enlarged prostate gland or prostate carcinoma. This results in moderate right hydroureteronephrosis which is increased compared to prior exam. Bilateral nephrolithiasis is noted. Stable sclerotic lesions are seen in the lumbar spine and left iliac bone concerning for metastatic disease. Stable 4.1 x 2.1 cm cystic lesion seen involving the uncinate process of the pancreas most likely representing low grade neoplasm or benign lesion. Follow-up with abdominal MRI with and without gadolinium may be performed for further evaluation on nonemergent basis. Electronically Signed   By: Marijo Conception M.D.   On: 02/13/2019 15:34    I independently reviewed the above imaging studies.  Impression/Recommendation 1) Hematuria/bladder mass: This is known per Dr. Alyson Ingles and he is apparently  currently in the process of scheduling the patient for TURBT for further evaluation.  Currently urine is grossly clear.  No indication for indwelling catheter or acute intervention.  I will make Dr. Alyson Ingles aware that he has been hospitalized for his information.  2) UTI: Treat with culture specific antibiotics for one week course.  This should be completed prior to any cystoscopic intervention anyway.  3) Right hydronephrosis: Likely related to bladder mass.  No indication for acute intervention.  Renal function improving with hydration and his Cr (2.2) is not far off baseline of (1.5-2.0).   Dutch Gray 02/14/2019, 10:22 AM    Pryor Curia MD  CC: Dr. Raiford Noble

## 2019-02-14 NOTE — Progress Notes (Signed)
PROGRESS NOTE    [name]  NVB:[id number] DOB: 1924-08-12 DOA: 02/13/2019 PCP: Robert Lei, MD   Brief Narrative: HPI per [name] on 02/13/2019 [name] is a 85 y.o. male with medical history significant of diastolic heart failure, CKD 3B , hypertension, history of prostate cancer and colon cancer.  He reports 7 days of dysuria, consistent with sharp pain, 10 out of 10 in intensity, specifically at the lower abdomen and perineum, mainly while urinating or moving his bowels.  It has been associated with hematuria, fatigue, bowel incontinence and generalized weakness.  No improving factors. His symptoms have been persistent for the last 7 days.  His prostate cancer he has been treated with supportive care and androgen deprivation.  Follows with [name] and [name].   Patient ambulates with the help of the walker and he lives with his daughter.   ED Course: He was found deconditioning and ill looking appearing, also has had hematuria, CT of the abdomen and pelvis showed a large multilobulated mass within the urinary bladder, concerning for bladder carcinoma potentially extension of enlarged prostate gland/prostate carcinoma.  He was referred for admission and further medical management.   **Interim History IV fluids to be discontinued today.  Urology evaluated and and recommending against Foley catheter at this time.  Patient is to see [name] in the outpatient setting for a TURBT.  Assessment & Plan:   Active Problems:   PROSTATE CANCER   Essential hypertension   Diastolic CHF (HCC)   Hematuria   AKI (acute kidney injury) ([facility])   CKD stage G3b/A2, GFR 30-44 and albumin creatinine ratio 30-299 mg/g (HCC)  Progressive prostate cancer complicated by obstructive uropathy,  Proteus Mirabilis Urinary tract infection Hematuria Right Hydronephrosis Acute Kidney Injury on CKD stage 3B, improving slightly  -Patient will be admitted to the medical  ward -CT of the Abdomen and Pelvis showed "Continued presence of large multilobulated mass within the urinary bladder concerning for bladder carcinoma or potentially extension of enlarged prostate gland or prostate carcinoma. This results in moderate right hydroureteronephrosis which is increased compared to prior exam. Bilateral nephrolithiasis is noted. Stable sclerotic lesions are seen in the lumbar spine and left iliac bone concerning for metastatic disease. Stable 4.1 x 2.1 cm cystic lesion seen involving the uncinate process of the pancreas most likely representing low grade neoplasm or benign lesion." -U/A Showed cloudy appearance with large hemoglobin on the urine dipstick, positive nitrites, protein of 100, many bacteria, greater than 50 RBCs per high-power field, greater than 50 WBCs, large leukocytes -Urine Cx showed >100,000 CFU of Proteus Mirabilis  -Started antibiotic therapy with IV Ceftriaxone and will continue  -Follow-up on cultures, cell count and temperature curve. Unfortunately Blood Cx2 were never obtained   -Gentle hydration with isotonic saline at 75 mL/hr to end today  -Will follow-up on kidney function in the morning, avoid hypotension and nephrotoxic agents.   -His calculated GFR is 33, consistent with chronic kidney disease stage III.  BUN/Cr went from 32/2.41 -> 31/2.25 -Continue to Monitor for S/Sx of Bleeding as patient is having Hematuria and continue to Monitor CBC -WBC went from 4.5 -> 3.3 -Hgb/Hct went from 11.0/34.4 -> 10.5/34.0 -Consulted Urology this AM for further recommendations in regards of progressive prostate cancer.  -Strict I's/O's; Was started on Tamsulosin 0.4 mg po Daily and also on Oxybutynin 2.5 mg po TID -Her [place]orden of urology evaluated and the patient is known to [name] and is in the process of  scheduling for a TUR UBT for further evaluation.  Urine this morning was slightly dark but neurology feels that is grossly clear no indication for  indwelling catheter acute intervention at this time -Patient's Hydronephrosis is likely related to bladder mass and Urology recommends no acute indication for intervention and renal function has improved with hydration and is approaching closer to baseline  Chronic Diastolic Heart Failure.  Ejection fraction left ventricle 60-65%.  Moderate focal basal hypertrophy of the septum.  February 2019.   -Patient hemodynamically stable,  -Appeared mildly hypovolemic so will continue Gentle IVF Hydration with NS at 75 mL/hr until 6:00 pm tonight -Will hold on further diuresis along with Entresto, -Will continue amlodipine.  -Strict I's/O's and Daily Weights; Patient is -381 mL since admission  -Weight is 182 on Admission and not repeated currently -Continue to Monitor for S/Sx of Volume Overload  -ASA currently being held due to Hematuria   Hypertension -Will continue Blood pressure control with Amlodipine 10 mg po Daily -Currently hold Entresto and diuretics for now and continue Gentle IVF with NS at 75 mL/hr until 6 pm today (24 hours).   Depression -Continue Citalopram 10 mg po Daily   Obesity -Estimated body mass index is 31.41 kg/m as calculated from the following:   Height as of this encounter: 5\' 4"  (1.626 m).   Weight as of this encounter: 83 kg. -Weight Loss and Dietary Counseling given    Pancreatic Lesion -CT Scan showed Stable 4.1 x 2.1 cm cystic lesion seen involving the uncinate process of the pancreas most likely representing low grade neoplasm or benign lesion.  -Outpatient Follow-up with abdominal MRI with and without gadolinium may be performed for further evaluation on non-emergent basis.  DVT prophylaxis: SCDs Code Status: FULL CODE Family Communication: Updated Daughter over the Phone (first contact listed) Disposition Plan: Remain Inpatient for continued Treatment and evaluation  Consultants:   Urology   Procedures:  None  Antimicrobials:  Anti-infectives  (From admission, onward)   Start     Dose/Rate Route Frequency Ordered Stop   02/14/19 1400  cefTRIAXone (ROCEPHIN) 1 g in sodium chloride 0.9 % 100 mL IVPB     1 g 200 mL/hr over 30 Minutes Intravenous Every 24 hours 02/13/19 1803     02/13/19 1315  cefTRIAXone (ROCEPHIN) 1 g in sodium chloride 0.9 % 100 mL IVPB     1 g 200 mL/hr over 30 Minutes Intravenous  Once 02/13/19 1307 02/13/19 1646     Subjective: Seen And examined at bedside is denying any complaints did have some lower abdominal pain.  No nausea or vomiting.  No lightheadedness or dizziness.  Wearing a condom catheter and able to urinate.  Discussed with the patient about a urologist, he states yes.  No other concerns or complaints at this time.  Objective: Vitals:   02/13/19 2031 02/13/19 2202 02/14/19 0515 02/14/19 0846  BP: (!) 146/61 (!) 134/51 (!) 138/54 (!) 126/52  Pulse:  (!) 49 (!) 52   Resp:  16 16   Temp:  97.7 F (36.5 C) 97.7 F (36.5 C)   TempSrc:  Oral Oral   SpO2:  99% 99%   Weight:      Height:        Intake/Output Summary (Last 24 hours) at 02/14/2019 1503 Last data filed at 02/14/2019 1139 Gross per 24 hour  Intake 793.98 ml  Output 975 ml  Net -181.02 ml   Filed Weights   02/13/19 1804  Weight: 83 kg  Examination: Physical Exam:  Constitutional: WN/WD elderly AAM in NAD and appears calm  Eyes: Lids and conjunctivae normal, sclerae anicteric  ENMT: External Ears, Nose appear normal. Grossly normal hearing.  Neck: Appears normal, supple, no cervical masses, normal ROM, no appreciable thyromegaly; no JVD Respiratory: Diminished to auscultation bilaterally, no wheezing, rales, rhonchi or crackles. Normal respiratory effort and patient is not tachypenic. No accessory muscle use.  Cardiovascular: RRR, no murmurs / rubs / gallops. S1 and S2 auscultated. No extremity edema.  Abdomen: Soft, Tender to palpate in the lower abdomen, non-distended. No masses palpated. No appreciable hepatosplenomegaly.  Bowel sounds positive x4.  GU: Deferred. Wearing a condom cath with dark amber colored urine Musculoskeletal: No clubbing / cyanosis of digits/nails. No joint deformity upper and lower extremities.  Skin: No rashes, lesions, ulcers on a limited skin evaluation. No induration; Warm and dry.  Neurologic: CN 2-12 grossly intact with no focal deficits.  Romberg sign and cerebellar reflexes not assessed.  Psychiatric: Normal judgment and insight. Alert and awake. Normal mood and appropriate affect.   Data Reviewed: I have personally reviewed following labs and imaging studies  CBC: Recent Labs  Lab 02/13/19 1418 02/14/19 0406  WBC 4.5 3.3*  NEUTROABS 2.2  --   HGB 11.0* 10.5*  HCT 34.4* 34.0*  MCV 82.7 84.4  PLT 228 103   Basic Metabolic Panel: Recent Labs  Lab 02/13/19 1418 02/14/19 0406  NA 142 141  K 4.2 4.2  CL 111 111  CO2 22 22  GLUCOSE 74 98  BUN 32* 31*  CREATININE 2.41* 2.25*  CALCIUM 9.3 9.0   GFR: Estimated Creatinine Clearance: 20.4 mL/min (A) (by C-G formula based on SCr of 2.25 mg/dL (H)). Liver Function Tests: Recent Labs  Lab 02/13/19 1418  AST 20  ALT 15  ALKPHOS 98  BILITOT 0.7  PROT 7.4  ALBUMIN 3.4*   No results for input(s): LIPASE, AMYLASE in the last 168 hours. No results for input(s): AMMONIA in the last 168 hours. Coagulation Profile: No results for input(s): INR, PROTIME in the last 168 hours. Cardiac Enzymes: No results for input(s): CKTOTAL, CKMB, CKMBINDEX, TROPONINI in the last 168 hours. BNP (last 3 results) No results for input(s): PROBNP in the last 8760 hours. HbA1C: No results for input(s): HGBA1C in the last 72 hours. CBG: No results for input(s): GLUCAP in the last 168 hours. Lipid Profile: No results for input(s): CHOL, HDL, LDLCALC, TRIG, CHOLHDL, LDLDIRECT in the last 72 hours. Thyroid Function Tests: No results for input(s): TSH, T4TOTAL, FREET4, T3FREE, THYROIDAB in the last 72 hours. Anemia Panel: No results for  input(s): VITAMINB12, FOLATE, FERRITIN, TIBC, IRON, RETICCTPCT in the last 72 hours. Sepsis Labs: No results for input(s): PROCALCITON, LATICACIDVEN in the last 168 hours.  Recent Results (from the past 240 hour(s))  Urine culture     Status: Abnormal (Preliminary result)   Collection Time: 02/13/19 12:29 PM  Result Value Ref Range Status   Specimen Description   Final    URINE, CLEAN CATCH Performed at [facility], [place] [address], [place], [place] [address]    Special Requests   Final    NONE Performed at [facility], [place] [address], [place], [place] [address]    Culture >=100,000 COLONIES/mL PROTEUS MIRABILIS (A)  Final   Report Status PENDING  Incomplete    Radiology Studies: Ct Abdomen Pelvis Wo Contrast  Result Date: 02/13/2019 CLINICAL DATA:  Acute lower abdominal pain, constipation. EXAM: CT ABDOMEN AND  PELVIS WITHOUT CONTRAST TECHNIQUE: Multidetector CT imaging of the abdomen and pelvis was performed following the standard protocol without IV contrast. COMPARISON:  CT scan of November 07, 2018. FINDINGS: Lower chest: No acute abnormality. Hepatobiliary: No focal liver abnormality is seen. No gallstones, gallbladder wall thickening, or biliary dilatation. Pancreas: 4.1 x 2.1 cm cystic lesion is seen involving uncinate process of the pancreas which is not significantly changed compared to prior exam. No ductal dilatation or inflammation is noted. Spleen: Normal in size without focal abnormality. Adrenals/Urinary Tract: Adrenal glands appear normal. Bilateral nephrolithiasis is noted. Moderate right hydronephrosis is noted which appears to be due to obstruction from large lobulated bladder mass. No hydronephrosis or renal obstruction is noted on the left. The bladder mass is highly concerning for bladder malignancy or possibly extension from prostate gland. Stomach/Bowel: The stomach is unremarkable. Postsurgical changes are seen involving the right  colon. There is no evidence of bowel obstruction or inflammation. Vascular/Lymphatic: Aortic atherosclerosis. No enlarged abdominal or pelvic lymph nodes. Reproductive: Enlarged prostate gland is noted and potentially extending into inferior portion of urinary bladder. Other: No abdominal wall hernia or abnormality. No abdominopelvic ascites. Musculoskeletal: Stable sclerotic lesions are seen involving the left iliac wing posteriorly as well as the L5 vertebral body consistent with metastatic disease. IMPRESSION: Continued presence of large multilobulated mass within the urinary bladder concerning for bladder carcinoma or potentially extension of enlarged prostate gland or prostate carcinoma. This results in moderate right hydroureteronephrosis which is increased compared to prior exam. Bilateral nephrolithiasis is noted. Stable sclerotic lesions are seen in the lumbar spine and left iliac bone concerning for metastatic disease. Stable 4.1 x 2.1 cm cystic lesion seen involving the uncinate process of the pancreas most likely representing low grade neoplasm or benign lesion. Follow-up with abdominal MRI with and without gadolinium may be performed for further evaluation on nonemergent basis. Electronically Signed   By: Marijo Conception M.D.   On: 02/13/2019 15:34   Scheduled Meds:  amLODipine  10 mg Oral Daily   citalopram  10 mg Oral Daily   neomycin-polymyxin b-dexamethasone  1 drop Both Eyes Q4H   oxybutynin  2.5 mg Oral TID   tamsulosin  0.4 mg Oral Daily   Continuous Infusions:  sodium chloride 75 mL/hr at 02/14/19 0848   cefTRIAXone (ROCEPHIN)  IV 1 g (02/14/19 1409)    LOS: 1 day   Kerney Elbe, DO [facility] PAGER is on AMION  If 7PM-7AM, please contact night-coverage [email or web address] Password [facility] 02/14/2019, 3:03 PM

## 2019-02-15 ENCOUNTER — Inpatient Hospital Stay (HOSPITAL_COMMUNITY): Payer: Medicare Other

## 2019-02-15 DIAGNOSIS — A498 Other bacterial infections of unspecified site: Secondary | ICD-10-CM

## 2019-02-15 LAB — COMPREHENSIVE METABOLIC PANEL
ALT: 12 U/L (ref 0–44)
AST: 16 U/L (ref 15–41)
Albumin: 2.9 g/dL — ABNORMAL LOW (ref 3.5–5.0)
Alkaline Phosphatase: 79 U/L (ref 38–126)
Anion gap: 7 (ref 5–15)
BUN: 28 mg/dL — ABNORMAL HIGH (ref 8–23)
CO2: 20 mmol/L — ABNORMAL LOW (ref 22–32)
Calcium: 8.7 mg/dL — ABNORMAL LOW (ref 8.9–10.3)
Chloride: 113 mmol/L — ABNORMAL HIGH (ref 98–111)
Creatinine, Ser: 1.97 mg/dL — ABNORMAL HIGH (ref 0.61–1.24)
GFR calc Af Amer: 33 mL/min — ABNORMAL LOW (ref >60)
GFR calc non Af Amer: 29 mL/min — ABNORMAL LOW (ref >60)
Glucose, Bld: 99 mg/dL (ref 70–99)
Potassium: 3.7 mmol/L (ref 3.5–5.1)
Sodium: 140 mmol/L (ref 135–145)
Total Bilirubin: 0.5 mg/dL (ref 0.3–1.2)
Total Protein: 6.3 g/dL — ABNORMAL LOW (ref 6.5–8.1)

## 2019-02-15 LAB — CBC WITH DIFFERENTIAL/PLATELET
Abs Immature Granulocytes: 0.01 10*3/uL (ref 0.00–0.07)
Basophils Absolute: 0 10*3/uL (ref 0.0–0.1)
Basophils Relative: 1 %
Eosinophils Absolute: 0.2 10*3/uL (ref 0.0–0.5)
Eosinophils Relative: 6 %
HCT: 31.3 % — ABNORMAL LOW (ref 39.0–52.0)
Hemoglobin: 9.9 g/dL — ABNORMAL LOW (ref 13.0–17.0)
Immature Granulocytes: 0 %
Lymphocytes Relative: 35 %
Lymphs Abs: 1.1 10*3/uL (ref 0.7–4.0)
MCH: 26.8 pg (ref 26.0–34.0)
MCHC: 31.6 g/dL (ref 30.0–36.0)
MCV: 84.8 fL (ref 80.0–100.0)
Monocytes Absolute: 0.4 10*3/uL (ref 0.1–1.0)
Monocytes Relative: 11 %
Neutro Abs: 1.6 10*3/uL — ABNORMAL LOW (ref 1.7–7.7)
Neutrophils Relative %: 47 %
Platelets: 198 10*3/uL (ref 150–400)
RBC: 3.69 MIL/uL — ABNORMAL LOW (ref 4.22–5.81)
RDW: 14.9 % (ref 11.5–15.5)
WBC: 3.3 10*3/uL — ABNORMAL LOW (ref 4.0–10.5)
nRBC: 0 % (ref 0.0–0.2)

## 2019-02-15 LAB — MAGNESIUM: Magnesium: 2.2 mg/dL (ref 1.7–2.4)

## 2019-02-15 LAB — URINE CULTURE: Culture: >=100000 — AB

## 2019-02-15 LAB — PHOSPHORUS: Phosphorus: 3.2 mg/dL (ref 2.5–4.6)

## 2019-02-15 MED ORDER — IPRATROPIUM-ALBUTEROL 0.5-2.5 (3) MG/3ML IN SOLN: 3.0000 mL | Freq: Two times a day (BID) | RESPIRATORY_TRACT | Status: DC

## 2019-02-15 MED ORDER — IPRATROPIUM-ALBUTEROL 0.5-2.5 (3) MG/3ML IN SOLN
3.0000 mL | Freq: Four times a day (QID) | RESPIRATORY_TRACT | Status: DC
  Administered 2019-02-15: 11:00:00 3 mL via RESPIRATORY_TRACT
  Filled 2019-02-15: qty 3

## 2019-02-15 MED ORDER — IPRATROPIUM-ALBUTEROL 0.5-2.5 (3) MG/3ML IN SOLN
3.0000 mL | Freq: Three times a day (TID) | RESPIRATORY_TRACT | Status: DC
  Administered 2019-02-15 – 2019-02-17 (×6): 3 mL via RESPIRATORY_TRACT
  Filled 2019-02-15 (×6): qty 3

## 2019-02-15 MED ORDER — FUROSEMIDE 40 MG PO TABS
40.0000 mg | ORAL_TABLET | Freq: Every day | ORAL | Status: DC
  Administered 2019-02-15 – 2019-02-17 (×3): 40 mg via ORAL
  Filled 2019-02-15 (×3): qty 1

## 2019-02-15 MED ORDER — SODIUM CHLORIDE 0.9 % IV SOLN
500.0000 mg | INTRAVENOUS | Status: DC
  Administered 2019-02-15 – 2019-02-16 (×2): 500 mg via INTRAVENOUS
  Filled 2019-02-15 (×3): qty 500

## 2019-02-15 MED ORDER — GUAIFENESIN ER 600 MG PO TB12
1200.0000 mg | ORAL_TABLET | Freq: Two times a day (BID) | ORAL | Status: DC
  Administered 2019-02-15 – 2019-02-17 (×4): 1200 mg via ORAL
  Filled 2019-02-15 (×5): qty 2

## 2019-02-15 NOTE — Progress Notes (Addendum)
PROGRESS NOTE    [name]  LSL:[id number] DOB: May 08, 1924 DOA: 02/13/2019 PCP: Lucianne Lei, MD   Brief Narrative: HPI per Dr. Sander Radon on 02/13/2019 [name] is a 85 y.o. male with medical history significant of diastolic heart failure, CKD 3B , hypertension, history of prostate cancer and colon cancer.  He reports 7 days of dysuria, consistent with sharp pain, 10 out of 10 in intensity, specifically at the lower abdomen and perineum, mainly while urinating or moving his bowels.  It has been associated with hematuria, fatigue, bowel incontinence and generalized weakness.  No improving factors. His symptoms have been persistent for the last 7 days.  His prostate cancer he has been treated with supportive care and androgen deprivation.  Follows with Dr. Alen Blew and Dr. Alyson Ingles.   Patient ambulates with the help of the walker and he lives with his daughter.  ED Course: He was found deconditioning and ill looking appearing, also has had hematuria, CT of the abdomen and pelvis showed a large multilobulated mass within the urinary bladder, concerning for bladder carcinoma potentially extension of enlarged prostate gland/prostate carcinoma.  He was referred for admission and further medical management.   **Interim History IV fluids to be discontinued yesterday.  Urology evaluated and and recommending against Foley catheter at this time.  Patient is to see Dr. Alyson Ingles in the outpatient setting for a TURBT. He is improving And Urine Cx grew out Proteus Mirabilis that was resistant to Ampicillin, Cefazolin, and Nitrofurantoin. Will obtain PT/OT evaluation. Hospitalization has been complicated some mild Wheezing.   Assessment & Plan:   Active Problems:   PROSTATE CANCER   Essential hypertension   Diastolic CHF (HCC)   Hematuria   AKI (acute kidney injury) ([place])   CKD stage G3b/A2, GFR 30-44 and albumin creatinine ratio 30-299 mg/g (HCC)  Progressive prostate cancer complicated  by Obstructive uropathy Proteus Mirabilis Urinary tract infection Hematuria Right Hydronephrosis Acute Kidney Injury on CKD stage 3B, improving -Patient will be admitted to the medical ward -CT of the Abdomen and Pelvis showed "Continued presence of large multilobulated mass within the urinary bladder concerning for bladder carcinoma or potentially extension of enlarged prostate gland or prostate carcinoma. This results in moderate right hydroureteronephrosis which is increased compared to prior exam. Bilateral nephrolithiasis is noted. Stable sclerotic lesions are seen in the lumbar spine and left iliac bone concerning for metastatic disease. Stable 4.1 x 2.1 cm cystic lesion seen involving the uncinate process of the pancreas most likely representing low grade neoplasm or benign lesion." -U/A Showed cloudy appearance with large hemoglobin on the urine dipstick, positive nitrites, protein of 100, many bacteria, greater than 50 RBCs per high-power field, greater than 50 WBCs, large leukocytes -Urine Cx showed >100,000 CFU of Proteus Mirabilis with  -Started antibiotic therapy with IV Ceftriaxone and will continue Resistance was only to Ampicillin, Cefazolin, and Nitrofurantoin  -Follow-up on cultures, cell count and temperature curve. Unfortunately Blood Cx2 were never obtained   -Gentle hydration with isotonic saline at 75 mL/hr stopped  -Will follow-up on kidney function in the morning, avoid hypotension and nephrotoxic agents.   -His calculated GFR is 33, consistent with chronic kidney disease stage III.  BUN/Cr went from 32/2.41 -> 31/2.25 -> 28/1.97 -Continue to Monitor for S/Sx of Bleeding as patient is having Hematuria and continue to Monitor CBC -WBC went from 4.5 -> 3.3 -Hgb/Hct went from 11.0/34.4 -> 10.5/34.0 -> 9.9/31.3 -Consulted Urology this AM for further recommendations in regards of progressive Prostate Cancer.  -  Strict I's/O's; Was started on Tamsulosin 0.4 mg po Daily and also  on Oxybutynin 2.5 mg po TID -Dr. Alinda Money of Urology evaluated and the patient is known to Dr. Alyson Ingles and is in the process of scheduling for a TUR UBT for further evaluation.  Urine this morning was slightly dark but neurology feels that is grossly clear no indication for indwelling catheter acute intervention at this time -Patient's Hydronephrosis is likely related to bladder mass and Urology recommends no acute indication for intervention and renal function has improved with hydration and is approaching closer to baseline -Will need PT/OT to evaluate and Treat   Chronic Diastolic Heart Failure.  Ejection fraction left ventricle 60-65%.  Moderate focal basal hypertrophy of the septum.  February 2019.   -Patient hemodynamically stable,  -Appeared mildly hypovolemic so will continue Gentle IVF Hydration with NS at 75 mL/hr now stopped  -Will hold on further diuresis along with Entresto, -Will continue amlodipine.  -Strict I's/O's and Daily Weights; Patient is -1,013.6 mL since admission  -Weight is 182 on Admission and not repeat is 186.95 lbs  -Continue to Monitor for S/Sx of Volume Overload  -ASA currently being held due to Hematuria   Hypertension -Will continue Blood pressure control with Amlodipine 10 mg po Daily -Currently hold Entresto and diuretics for now and continued Gentle IVF with NS at 75 mL/hr now stopped    Depression -Continue Citalopram 10 mg po Daily   Obesity -Estimated body mass index is 32.09 kg/m as calculated from the following:   Height as of this encounter: 5\' 4"  (1.626 m).   Weight as of this encounter: 84.8 kg. -Weight Loss and Dietary Counseling given    Pancreatic Lesion -CT Scan showed Stable 4.1 x 2.1 cm cystic lesion seen involving the uncinate process of the pancreas most likely representing low grade neoplasm or benign lesion.  -Outpatient Follow-up with abdominal MRI with and without gadolinium may be performed for further evaluation on  non-emergent basis.  Wheezing from CAP and Right Pleural Effusion  -Patient had Right sided wheezing this AM -CXR ordered  -Added DuoNeb 3 mL BID  -Add Incentive Spirometry  -Continue to Monitor  ADDENDUM 1610: -CXR reviewed and shows Right Sided PNA -Check Blood Cx x2 -Currently Afebrile and WBC is 3.3 -Patient is coughing but not hypoxic -Will continue IV Ceftriaxone and add IV Azithromycin for CAP Coverage -Add Flutter Valve, Guaifenesin  -Currently not requiring Supplemental O2 via [place] -Will need Ambulatory Home O2 Screen Prior to D/C -Stop IVF and resume Home Lasix (Takes Daily prn but will give 1 Dose now) -Repeat CXR in AM  -PT/OT/SLP to evaluate and Treat  Normocytic Anemia -Patient's Hb/Hct went from 11.0/34.4 -> 10.5/34.0 -> 9.9/31.3 -In the setting of Dilutional Drop versus hematuria -Check Anemia Panel in AM -Continue to Monitor for S/Sx of Bleeding as he had hematuria earlier -Repeat CBC in AM   Metabolic Acidosis -Mild and Likely in the setting of Renal Dysfunction -CO2 was 20 and AG was 7 -Continue to Monitor and repeat BMP in AM   DVT prophylaxis: SCDs Code Status: FULL CODE Family Communication: No family present at bedside  Disposition Plan: Remain Inpatient for continued Treatment and evaluation  Consultants:   Urology   Procedures:  None  Antimicrobials:  Anti-infectives (From admission, onward)   Start     Dose/Rate Route Frequency Ordered Stop   02/14/19 1400  cefTRIAXone (ROCEPHIN) 1 g in sodium chloride 0.9 % 100 mL IVPB     1  g 200 mL/hr over 30 Minutes Intravenous Every 24 hours 02/13/19 1803     02/13/19 1315  cefTRIAXone (ROCEPHIN) 1 g in sodium chloride 0.9 % 100 mL IVPB     1 g 200 mL/hr over 30 Minutes Intravenous  Once 02/13/19 1307 02/13/19 1646     Subjective: Seen and examined at bedside dates that his lower abdominal pain is improved significantly.  Had a little bit of wheezing today.  No nausea or vomiting.  No  lightheadedness or dizziness.  Hopeful to go home soon.  No chest pain.  No other concerns complaints at this time  Objective: Vitals:   02/15/19 0830 02/15/19 1041 02/15/19 1041 02/15/19 1111  BP: (!) 149/56 (!) 158/58 (!) 158/58   Pulse: 62  62   Resp: (!) 21     Temp: 97.6 F (36.4 C)     TempSrc: Oral     SpO2: 99%   98%  Weight:      Height:        Intake/Output Summary (Last 24 hours) at 02/15/2019 1246 Last data filed at 02/15/2019 2035 Gross per 24 hour  Intake 693.82 ml  Output 1925 ml  Net -1231.18 ml   Filed Weights   02/13/19 1804 02/15/19 0700  Weight: 83 kg 84.8 kg   Examination: Physical Exam:  Constitutional: Well-nourished, well-developed elderly African-American male currently no acute distress appears calm and appears improved from yesterday. Eyes: Lids and conjunctive are normal.  Sclera anicteric ENMT: External ears and nose appear normal.  Grossly normal hearing.  Has poor dentition Neck: Appears supple no JVD Respiratory: Diminished auscultation bilaterally with some Right sided wheezing; No appreciable rales, rhonchi.  Patient not tachypneic or using accessory muscles to breathe Cardiovascular: Regular rate and rhythm.  No appreciable murmurs, rubs, gallops.  No extremity edema Abdomen: Soft, not tender to palpate today.  Slightly distended.  Bowel sounds present x4. GU: Deferred.  He is wearing a condom catheter has darker amber color urine Musculoskeletal: No contractures or cyanosis.  No joint deformities in upper lower extremities Skin: No appreciable rashes or lesions on a limited skin evaluation Neurologic: Cranial nerves II through XII grossly intact no appreciable focal deficits Psychiatric: Normal judgment and insight.  He is awake and alert.  Data Reviewed: I have personally reviewed following labs and imaging studies  CBC: Recent Labs  Lab 02/13/19 1418 02/14/19 0406 02/15/19 0337  WBC 4.5 3.3* 3.3*  NEUTROABS 2.2  --  1.6*  HGB  11.0* 10.5* 9.9*  HCT 34.4* 34.0* 31.3*  MCV 82.7 84.4 84.8  PLT 228 216 597   Basic Metabolic Panel: Recent Labs  Lab 02/13/19 1418 02/14/19 0406 02/15/19 0337  NA 142 141 140  K 4.2 4.2 3.7  CL 111 111 113*  CO2 22 22 20*  GLUCOSE 74 98 99  BUN 32* 31* 28*  CREATININE 2.41* 2.25* 1.97*  CALCIUM 9.3 9.0 8.7*  MG  --   --  2.2  PHOS  --   --  3.2   GFR: Estimated Creatinine Clearance: 23.5 mL/min (A) (by C-G formula based on SCr of 1.97 mg/dL (H)). Liver Function Tests: Recent Labs  Lab 02/13/19 1418 02/15/19 0337  AST 20 16  ALT 15 12  ALKPHOS 98 79  BILITOT 0.7 0.5  PROT 7.4 6.3*  ALBUMIN 3.4* 2.9*   No results for input(s): LIPASE, AMYLASE in the last 168 hours. No results for input(s): AMMONIA in the last 168 hours. Coagulation Profile: No results for  input(s): INR, PROTIME in the last 168 hours. Cardiac Enzymes: No results for input(s): CKTOTAL, CKMB, CKMBINDEX, TROPONINI in the last 168 hours. BNP (last 3 results) No results for input(s): PROBNP in the last 8760 hours. HbA1C: No results for input(s): HGBA1C in the last 72 hours. CBG: No results for input(s): GLUCAP in the last 168 hours. Lipid Profile: No results for input(s): CHOL, HDL, LDLCALC, TRIG, CHOLHDL, LDLDIRECT in the last 72 hours. Thyroid Function Tests: No results for input(s): TSH, T4TOTAL, FREET4, T3FREE, THYROIDAB in the last 72 hours. Anemia Panel: No results for input(s): VITAMINB12, FOLATE, FERRITIN, TIBC, IRON, RETICCTPCT in the last 72 hours. Sepsis Labs: No results for input(s): PROCALCITON, LATICACIDVEN in the last 168 hours.  Recent Results (from the past 240 hour(s))  Urine culture     Status: Abnormal   Collection Time: 02/13/19 12:29 PM  Result Value Ref Range Status   Specimen Description   Final    URINE, CLEAN CATCH Performed at [facility], [place] [address] [place], [place] [address]    Special Requests   Final    NONE Performed at [facility], [place] [address] [place], [place] [address]    Culture >=100,000 COLONIES/mL PROTEUS MIRABILIS (A)  Final   Report Status 02/15/2019 FINAL  Final   Organism ID, Bacteria PROTEUS MIRABILIS (A)  Final      Susceptibility   Proteus mirabilis - MIC*    AMPICILLIN >=32 RESISTANT Resistant     CEFAZOLIN >=64 RESISTANT Resistant     CEFTRIAXONE <=1 SENSITIVE Sensitive     CIPROFLOXACIN <=0.25 SENSITIVE Sensitive     GENTAMICIN <=1 SENSITIVE Sensitive     IMIPENEM 4 SENSITIVE Sensitive     NITROFURANTOIN 128 RESISTANT Resistant     TRIMETH/SULFA <=20 SENSITIVE Sensitive     AMPICILLIN/SULBACTAM 8 SENSITIVE Sensitive     PIP/TAZO <=4 SENSITIVE Sensitive     * >=100,000 COLONIES/mL PROTEUS MIRABILIS    Radiology Studies: Ct Abdomen Pelvis Wo Contrast  Result Date: 02/13/2019 CLINICAL DATA:  Acute lower abdominal pain, constipation. EXAM: CT ABDOMEN AND PELVIS WITHOUT CONTRAST TECHNIQUE: Multidetector CT imaging of the abdomen and pelvis was performed following the standard protocol without IV contrast. COMPARISON:  CT scan of November 07, 2018. FINDINGS: Lower chest: No acute abnormality. Hepatobiliary: No focal liver abnormality is seen. No gallstones, gallbladder wall thickening, or biliary dilatation. Pancreas: 4.1 x 2.1 cm cystic lesion is seen involving uncinate process of the pancreas which is not significantly changed compared to prior exam. No ductal dilatation or inflammation is noted. Spleen: Normal in size without focal abnormality. Adrenals/Urinary Tract: Adrenal glands appear normal. Bilateral nephrolithiasis is noted. Moderate right hydronephrosis is noted which appears to be due to obstruction from large lobulated bladder mass. No hydronephrosis or renal obstruction is noted on the left. The bladder mass is highly concerning for bladder malignancy or possibly extension from prostate gland. Stomach/Bowel: The stomach is unremarkable. Postsurgical changes are seen  involving the right colon. There is no evidence of bowel obstruction or inflammation. Vascular/Lymphatic: Aortic atherosclerosis. No enlarged abdominal or pelvic lymph nodes. Reproductive: Enlarged prostate gland is noted and potentially extending into inferior portion of urinary bladder. Other: No abdominal wall hernia or abnormality. No abdominopelvic ascites. Musculoskeletal: Stable sclerotic lesions are seen involving the left iliac wing posteriorly as well as the L5 vertebral body consistent with metastatic disease. IMPRESSION: Continued presence of large multilobulated mass within the urinary bladder concerning for bladder carcinoma or potentially extension of  enlarged prostate gland or prostate carcinoma. This results in moderate right hydroureteronephrosis which is increased compared to prior exam. Bilateral nephrolithiasis is noted. Stable sclerotic lesions are seen in the lumbar spine and left iliac bone concerning for metastatic disease. Stable 4.1 x 2.1 cm cystic lesion seen involving the uncinate process of the pancreas most likely representing low grade neoplasm or benign lesion. Follow-up with abdominal MRI with and without gadolinium may be performed for further evaluation on nonemergent basis. Electronically Signed   By: Marijo Conception M.D.   On: 02/13/2019 15:34   Scheduled Meds:  amLODipine  10 mg Oral Daily   citalopram  10 mg Oral Daily   ipratropium-albuterol  3 mL Nebulization Q6H   neomycin-polymyxin b-dexamethasone  1 drop Both Eyes Q4H   oxybutynin  2.5 mg Oral TID   tamsulosin  0.4 mg Oral Daily   Continuous Infusions:  cefTRIAXone (ROCEPHIN)  IV 1 g (02/14/19 1409)    LOS: 2 days   Kerney Elbe, DO [facility] PAGER is on AMION  If 7PM-7AM, please contact night-coverage [email or web address] Password [facility] 02/15/2019, 12:46 PM

## 2019-02-15 NOTE — Progress Notes (Signed)
SATURATION QUALIFICATIONS: (This note is used to comply with regulatory documentation for home oxygen)  Patient Saturations on Room Air at Rest = 96%  Patient Saturations on Room Air while Ambulating = 88%  Patient Saturations on 2 Liters of oxygen while Ambulating = 96%  Please briefly explain why patient needs home oxygen: Patient unable to maintain oxygen saturation greater than 92% on room air

## 2019-02-16 ENCOUNTER — Inpatient Hospital Stay (HOSPITAL_COMMUNITY): Payer: Medicare Other

## 2019-02-16 DIAGNOSIS — I447 Left bundle-branch block, unspecified: Secondary | ICD-10-CM

## 2019-02-16 HISTORY — DX: Left bundle-branch block, unspecified: I44.7

## 2019-02-16 LAB — CBC WITH DIFFERENTIAL/PLATELET
Abs Immature Granulocytes: 0.02 10*3/uL (ref 0.00–0.07)
Basophils Absolute: 0 10*3/uL (ref 0.0–0.1)
Basophils Relative: 1 %
Eosinophils Absolute: 0.2 10*3/uL (ref 0.0–0.5)
Eosinophils Relative: 7 %
HCT: 33.9 % — ABNORMAL LOW (ref 39.0–52.0)
Hemoglobin: 10.6 g/dL — ABNORMAL LOW (ref 13.0–17.0)
Immature Granulocytes: 1 %
Lymphocytes Relative: 33 %
Lymphs Abs: 1.2 10*3/uL (ref 0.7–4.0)
MCH: 27 pg (ref 26.0–34.0)
MCHC: 31.3 g/dL (ref 30.0–36.0)
MCV: 86.3 fL (ref 80.0–100.0)
Monocytes Absolute: 0.4 10*3/uL (ref 0.1–1.0)
Monocytes Relative: 10 %
Neutro Abs: 1.8 10*3/uL (ref 1.7–7.7)
Neutrophils Relative %: 48 %
Platelets: 194 10*3/uL (ref 150–400)
RBC: 3.93 MIL/uL — ABNORMAL LOW (ref 4.22–5.81)
RDW: 15.1 % (ref 11.5–15.5)
WBC: 3.7 10*3/uL — ABNORMAL LOW (ref 4.0–10.5)
nRBC: 0 % (ref 0.0–0.2)

## 2019-02-16 LAB — COMPREHENSIVE METABOLIC PANEL
ALT: 13 U/L (ref 0–44)
AST: 19 U/L (ref 15–41)
Albumin: 3.1 g/dL — ABNORMAL LOW (ref 3.5–5.0)
Alkaline Phosphatase: 80 U/L (ref 38–126)
Anion gap: 8 (ref 5–15)
BUN: 23 mg/dL (ref 8–23)
CO2: 21 mmol/L — ABNORMAL LOW (ref 22–32)
Calcium: 9.2 mg/dL (ref 8.9–10.3)
Chloride: 111 mmol/L (ref 98–111)
Creatinine, Ser: 2 mg/dL — ABNORMAL HIGH (ref 0.61–1.24)
GFR calc Af Amer: 33 mL/min — ABNORMAL LOW (ref >60)
GFR calc non Af Amer: 28 mL/min — ABNORMAL LOW (ref >60)
Glucose, Bld: 88 mg/dL (ref 70–99)
Potassium: 4.8 mmol/L (ref 3.5–5.1)
Sodium: 140 mmol/L (ref 135–145)
Total Bilirubin: 0.7 mg/dL (ref 0.3–1.2)
Total Protein: 6.8 g/dL (ref 6.5–8.1)

## 2019-02-16 LAB — MAGNESIUM: Magnesium: 2.3 mg/dL (ref 1.7–2.4)

## 2019-02-16 LAB — PHOSPHORUS: Phosphorus: 3.2 mg/dL (ref 2.5–4.6)

## 2019-02-16 MED ORDER — SODIUM CHLORIDE 0.9 % IV SOLN
3.0000 g | Freq: Two times a day (BID) | INTRAVENOUS | Status: DC
  Administered 2019-02-16 – 2019-02-17 (×3): 3 g via INTRAVENOUS
  Filled 2019-02-16 (×4): qty 3

## 2019-02-16 MED ORDER — SODIUM CHLORIDE 0.9 % IV SOLN
3.0000 g | Freq: Four times a day (QID) | INTRAVENOUS | Status: DC
  Filled 2019-02-16 (×2): qty 3

## 2019-02-16 NOTE — Evaluation (Signed)
Occupational Therapy Evaluation Patient Details Name: [name] MRN: [id number] DOB: 10-03-1924 Today's Date: 02/16/2019    History of Present Illness 85 year old male admitted with hematuria Dx: diastolic CHF. PMH: diastolic CHF, HTN, prostate cancer, colon cancer   Clinical Impression   Pt admitted with hematuria. Pt currently with functional limitations due to the deficits listed below (see OT Problem List).  Pt will benefit from skilled OT to increase their safety and independence with ADL and functional mobility for ADL to facilitate discharge to venue listed below.      Follow Up Recommendations  Home health OT;Supervision/Assistance - 24 hour    Equipment Recommendations  None recommended by OT    Recommendations for Other Services       Precautions / Restrictions Precautions Precautions: Fall Precaution Comments: check sats Restrictions Weight Bearing Restrictions: No      Mobility Bed Mobility Overal bed mobility: Needs Assistance Bed Mobility: Supine to Sit;Sit to Supine     Supine to sit: Supervision Sit to supine: Supervision   General bed mobility comments: no assist needed to sit up  Transfers Overall transfer level: Needs assistance Equipment used: Rolling walker (2 wheeled) Transfers: Sit to/from [facility] Sit to Stand: Min assist Stand pivot transfers: Min guard       General transfer comment: steady assist to rise from recliner. Assist to  walk 5' to recliner    Balance Overall balance assessment: Needs assistance Sitting-balance support: No upper extremity supported;Feet supported Sitting balance-Leahy Scale: Good     Standing balance support: Bilateral upper extremity supported;During functional activity Standing balance-Leahy Scale: Fair                             ADL either performed or assessed with clinical judgement   ADL Overall ADL's : Needs assistance/impaired Eating/Feeding: Set  up;Sitting   Grooming: Set up;Sitting   Upper Body Bathing: Minimal assistance   Lower Body Bathing: Set up   Upper Body Dressing : Minimal assistance;Sitting   Lower Body Dressing: Minimal assistance;Sit to/from stand;Cueing for safety;Cueing for sequencing   Toilet Transfer: Minimal assistance;Ambulation;Stand-pivot   Toileting- Clothing Manipulation and Hygiene: Minimal assistance;Sit to/from stand;Cueing for sequencing;Cueing for safety       Functional mobility during ADLs: Minimal assistance;Cueing for safety;Cueing for sequencing       Vision Patient Visual Report: No change from baseline              Pertinent Vitals/Pain Pain Assessment: No/denies pain     Hand Dominance     Extremity/Trunk Assessment Upper Extremity Assessment Upper Extremity Assessment: Generalized weakness           Communication Communication Communication: No difficulties   Cognition Arousal/Alertness: Awake/alert Behavior During Therapy: WFL for tasks assessed/performed Overall Cognitive Status: Within Functional Limits for tasks assessed                                                Home Living Family/patient expects to be discharged to:: Private residence Living Arrangements: Children Available Help at Discharge: Available 24 hours/day Type of Home: House Home Access: Level entry     Home Layout: One level     Bathroom Shower/Tub: Tub/shower unit         Home Equipment: Cane - single point;Walker - 2 wheels  Additional Comments: Daughters assist him daily; has cane, RW (  , tub seat. Reports independence with BADLs; daughters assist with meals and transportation      Prior Functioning/Environment Level of Independence: Needs assistance  Gait / Transfers Assistance Needed: family assists PRN              OT Problem List: Decreased strength;Decreased activity tolerance;Impaired balance (sitting and/or standing)      OT  Treatment/Interventions: Self-care/ADL training;Patient/family education;Therapeutic activities    OT Goals(Current goals can be found in the care plan section) Acute Rehab OT Goals Patient Stated Goal: agreed to OOB OT Goal Formulation: With patient Time For Goal Achievement: 03/02/19  OT Frequency: Min 2X/week    AM-PAC OT "6 Clicks" Daily Activity     Outcome Measure Help from another person eating meals?: None Help from another person taking care of personal grooming?: A Little Help from another person toileting, which includes using toliet, bedpan, or urinal?: A Little Help from another person bathing (including washing, rinsing, drying)?: A Little Help from another person to put on and taking off regular upper body clothing?: A Little Help from another person to put on and taking off regular lower body clothing?: A Little 6 Click Score: 19   End of Session Equipment Utilized During Treatment: Rolling walker Nurse Communication: Mobility status  Activity Tolerance: Patient tolerated treatment well Patient left: in bed;with call bell/phone within reach  OT Visit Diagnosis: Unsteadiness on feet (R26.81);Muscle weakness (generalized) (M62.81)                Time: 5093-2671 OT Time Calculation (min): 23 min Charges:  OT General Charges $OT Visit: 1 Visit OT Evaluation $OT Eval Moderate Complexity: 1 Mod  Kari Baars, [place] [phone] Office- [phone], Edwena Felty D 02/16/2019, 4:12 PM

## 2019-02-16 NOTE — Progress Notes (Signed)
PROGRESS NOTE    [name]  SHF:[id number] DOB: 1923/11/12 DOA: 02/13/2019 PCP: Lucianne Lei, MD   Brief Narrative: HPI per Dr. Sander Radon on 02/13/2019 [name] is a 85 y.o. male with medical history significant of diastolic heart failure, CKD 3B , hypertension, history of prostate cancer and colon cancer.  He reports 7 days of dysuria, consistent with sharp pain, 10 out of 10 in intensity, specifically at the lower abdomen and perineum, mainly while urinating or moving his bowels.  It has been associated with hematuria, fatigue, bowel incontinence and generalized weakness.  No improving factors. His symptoms have been persistent for the last 7 days.  His prostate cancer he has been treated with supportive care and androgen deprivation.  Follows with Dr. Alen Blew and Dr. Alyson Ingles.   Patient ambulates with the help of the walker and he lives with his daughter.  ED Course: He was found deconditioning and ill looking appearing, also has had hematuria, CT of the abdomen and pelvis showed a large multilobulated mass within the urinary bladder, concerning for bladder carcinoma potentially extension of enlarged prostate gland/prostate carcinoma.  He was referred for admission and further medical management.   **Interim History IV fluids to be discontinued yesterday.  Urology evaluated and and recommending against Foley catheter at this time.  Patient is to see Dr. Alyson Ingles in the outpatient setting for a TURBT. He is improving And Urine Cx grew out Proteus Mirabilis that was resistant to Ampicillin, Cefazolin, and Nitrofurantoin. Will obtain PT/OT evaluation. Hospitalization has been complicated some mild Wheezing and CXR revealed a PNA and Pleural Effusion. Home Lasix was restarted and Repeat CXR with concern for Aspiration so SLP was consulted and Abx were changed to Unasyn.   Assessment & Plan:   Active Problems:   PROSTATE CANCER   Essential hypertension   Diastolic CHF (HCC)  Hematuria   AKI (acute kidney injury) ([place])   CKD stage G3b/A2, GFR 30-44 and albumin creatinine ratio 30-299 mg/g (HCC)  Progressive prostate cancer complicated by Obstructive uropathy Proteus Mirabilis Urinary tract infection Hematuria Right Hydronephrosis Acute Kidney Injury on CKD stage 3B, improving -Patient will be admitted to the medical ward -CT of the Abdomen and Pelvis showed "Continued presence of large multilobulated mass within the urinary bladder concerning for bladder carcinoma or potentially extension of enlarged prostate gland or prostate carcinoma. This results in moderate right hydroureteronephrosis which is increased compared to prior exam. Bilateral nephrolithiasis is noted. Stable sclerotic lesions are seen in the lumbar spine and left iliac bone concerning for metastatic disease. Stable 4.1 x 2.1 cm cystic lesion seen involving the uncinate process of the pancreas most likely representing low grade neoplasm or benign lesion." -U/A Showed cloudy appearance with large hemoglobin on the urine dipstick, positive nitrites, protein of 100, many bacteria, greater than 50 RBCs per high-power field, greater than 50 WBCs, large leukocytes -Urine Cx showed >100,000 CFU of Proteus Mirabilis with  -Started antibiotic therapy with IV Ceftriaxone; Resistance was only to Ampicillin, Cefazolin, and Nitrofurantoin; Abx changed to IV Unasyn given his suspected Aspiration PNA  -Follow-up on cultures, cell count and temperature curve. Unfortunately Blood Cx2 were never obtained   -Gentle hydration with isotonic saline at 75 mL/hr stopped  -Will follow-up on kidney function in the morning, avoid hypotension and nephrotoxic agents.   -His calculated GFR is 33, consistent with chronic kidney disease stage III.  BUN/Cr went from 32/2.41 -> 31/2.25 -> 28/1.97 -> 23/2.00 -Continue to Monitor for S/Sx of Bleeding as patient  is having Hematuria and continue to Monitor CBC -WBC went from 4.5 -> 3.3 ->  3.7 -Hgb/Hct went from 11.0/34.4 -> 10.5/34.0 -> 9.9/31.3 -> 10.6/33.9 -Consulted Urology this AM for further recommendations in regards of progressive Prostate Cancer.  -Strict I's/O's; Was started on Tamsulosin 0.4 mg po Daily and also on Oxybutynin 2.5 mg po TID -Dr. Alinda Money of Urology evaluated and the patient is known to Dr. Alyson Ingles and is in the process of scheduling for a TUR UBT for further evaluation.  Urine this morning was slightly dark but neurology feels that is grossly clear no indication for indwelling catheter acute intervention at this time -Patient's Hydronephrosis is likely related to bladder mass and Urology recommends no acute indication for intervention and renal function has improved with hydration and is approaching closer to baseline -Will need PT/OT to evaluate and Treat and they are recommending Home Health   Chronic Diastolic Heart Failure.  Ejection fraction left ventricle 60-65%.  Moderate focal basal hypertrophy of the septum.  February 2019.   -Patient hemodynamically stable,  -Appeared mildly hypovolemic on admission; Gentle IVF Hydration with NS at 75 mL/hr now stopped  -CXR this AM showed  -Resumed Home Lasix yesterday; Currently still holding Entresto -Will continue Amlodipine.  -Strict I's/O's and Daily Weights; Patient is -2,943 mL since admission -Weight is 182 on Admission and on repeat is 186.95 lbs  -Continue to Monitor for S/Sx of Volume Overload  -ASA currently being held due to Hematuria   Hypertension -Will continue Blood pressure control with Amlodipine 10 mg po Daily -Currently hold Entresto; -Diuretic with Lasix resumed and Gentle IVF with NS at 75 mL/hr now stopped    Depression -Continue Citalopram 10 mg po Daily   Obesity -Estimated body mass index is 32.09 kg/m as calculated from the following:   Height as of this encounter: 5\' 4"  (1.626 m).   Weight as of this encounter: 84.8 kg. -Weight Loss and Dietary Counseling given     Pancreatic Lesion -CT Scan showed Stable 4.1 x 2.1 cm cystic lesion seen involving the uncinate process of the pancreas most likely representing low grade neoplasm or benign lesion.  -Outpatient Follow-up with abdominal MRI with and without gadolinium may be performed for further evaluation on non-emergent basis.  Wheezing from CAP vs. Aspiration PNA and Right Pleural Effusion -Patient had Right sided wheezing yesterday AM -Added DuoNeb 3 mL BID  -Add Incentive Spirometry  -CXR reviewed and shows Right Sided PNA -Check Blood Cx x2 show NGTD <12 Hours -Currently Afebrile and WBC is 3.7 -Patient is coughing but not hypoxic -Started IV Ceftriaxone and add IV Azithromycin for CAP Coverage but changed IV Ceftriaxone to IV Unasyn  -Add Flutter Valve, Guaifenesin  -Currently not requiring Supplemental O2 via [place] -Will need to repeat Ambulatory Home O2 Screen Prior to D/C as he was Hypoxic Yesterday while ambulating as O2 dropped to 88% -Stopped IVF; Home Lasix Resumed -Repeat CXR this AM showed Low Ling Volumes with progressive Right Lower Lobe Airspace Disease/PNA and Moderate Pulmonary Vascular Congestion;  -Aspiration is considered given his Age and location of Pneumonia so Abx changed as above -PT/OT/SLP to evaluate and Treat; PT recommending Home Health PT  Normocytic Anemia -Patient's Hb/Hct went from 11.0/34.4 -> 10.5/34.0 -> 9.9/31.3 -> 10.6/33.9 -In the setting of Dilutional Drop versus hematuria -Check Anemia Panel in AM -Continue to Monitor for S/Sx of Bleeding as he had hematuria earlier -Repeat CBC in AM   Metabolic Acidosis -Mild and Likely in the setting of  Renal Dysfunction -CO2 was 20 and AG was 7; Now CO2 is 21 and AG is 8 -Continue to Monitor and repeat BMP in AM   DVT prophylaxis: SCDs Code Status: FULL CODE Family Communication: No family present at bedside  Disposition Plan: Remain Inpatient for continued Treatment and evaluation and Home Health PT at  D/C  Consultants:   Urology   Procedures:  None  Antimicrobials:  Anti-infectives (From admission, onward)   Start     Dose/Rate Route Frequency Ordered Stop   02/16/19 0900  Ampicillin-Sulbactam (UNASYN) 3 g in sodium chloride 0.9 % 100 mL IVPB  Status:  Discontinued     3 g 200 mL/hr over 30 Minutes Intravenous Every 6 hours 02/16/19 0841 02/16/19 0854   02/16/19 0900  Ampicillin-Sulbactam (UNASYN) 3 g in sodium chloride 0.9 % 100 mL IVPB     3 g 200 mL/hr over 30 Minutes Intravenous Every 12 hours 02/16/19 0854     02/15/19 1730  azithromycin (ZITHROMAX) 500 mg in sodium chloride 0.9 % 250 mL IVPB     500 mg 250 mL/hr over 60 Minutes Intravenous Every 24 hours 02/15/19 1624     02/14/19 1400  cefTRIAXone (ROCEPHIN) 1 g in sodium chloride 0.9 % 100 mL IVPB  Status:  Discontinued     1 g 200 mL/hr over 30 Minutes Intravenous Every 24 hours 02/13/19 1803 02/16/19 0854   02/13/19 1315  cefTRIAXone (ROCEPHIN) 1 g in sodium chloride 0.9 % 100 mL IVPB     1 g 200 mL/hr over 30 Minutes Intravenous  Once 02/13/19 1307 02/13/19 1646     Subjective: Seen and examined at bedside and states that he was doing better.  Denies any abdominal pain but states that he has been coughing some.  No nausea or vomiting.  Did not desaturate yesterday on ambulatory screen.  No chest pain, lightheadedness or dizziness.  No burning or discomfort in urination today.  No other concerns complaints at this time and hopeful to go home soon but still awaiting speech therapy evaluation.  Objective: Vitals:   02/16/19 0801 02/16/19 0804 02/16/19 1009 02/16/19 1322  BP:   (!) 138/52 (!) 119/50  Pulse:   61 (!) 51  Resp:    18  Temp:      TempSrc:      SpO2: 97% 97%  100%  Weight:      Height:        Intake/Output Summary (Last 24 hours) at 02/16/2019 1406 Last data filed at 02/16/2019 1146 Gross per 24 hour  Intake 950 ml  Output 2600 ml  Net -1650 ml   Filed Weights   02/13/19 1804 02/15/19 0700   Weight: 83 kg 84.8 kg   Examination: Physical Exam:  Constitutional: Well-nourished, well-developed elderly African-American male currently no acute distress appears calm and comfortable and has unlabored breathing. Eyes: Lids and conjunctive are normal.  Sclera anicteric ENMT: External ears nose appear normal.  Somewhat hard of hearing.  Has poor dentition Neck: Appears supple no JVD Respiratory: Diminished auscultation bilaterally with diminished breath sounds on the right and mild wheezing coarse breath sounds.  He is not tachypneic or using any accessory muscles to breathe Cardiovascular: Regular rate and rhythm.  No appreciable murmurs, rubs, gallops.  Mild lower extremity edema Abdomen: Soft, nontender.  Mildly distended.  Bowel sounds present GU: Deferred Musculoskeletal: No contractures or cyanosis.  No joint deformities upper lower extremities Skin: No appreciable rashes or lesions on physical evaluation Neurologic: Cranial nerves  II through XII grossly intact no appreciable focal deficits. Psychiatric: Normal judgment and insight.  Patient is awake and alert and oriented x2.  Data Reviewed: I have personally reviewed following labs and imaging studies  CBC: Recent Labs  Lab 02/13/19 1418 02/14/19 0406 02/15/19 0337 02/16/19 0411  WBC 4.5 3.3* 3.3* 3.7*  NEUTROABS 2.2  --  1.6* 1.8  HGB 11.0* 10.5* 9.9* 10.6*  HCT 34.4* 34.0* 31.3* 33.9*  MCV 82.7 84.4 84.8 86.3  PLT 228 216 198 324   Basic Metabolic Panel: Recent Labs  Lab 02/13/19 1418 02/14/19 0406 02/15/19 0337 02/16/19 0411  NA 142 141 140 140  K 4.2 4.2 3.7 4.8  CL 111 111 113* 111  CO2 22 22 20* 21*  GLUCOSE 74 98 99 88  BUN 32* 31* 28* 23  CREATININE 2.41* 2.25* 1.97* 2.00*  CALCIUM 9.3 9.0 8.7* 9.2  MG  --   --  2.2 2.3  PHOS  --   --  3.2 3.2   GFR: Estimated Creatinine Clearance: 23.1 mL/min (A) (by C-G formula based on SCr of 2 mg/dL (H)). Liver Function Tests: Recent Labs  Lab  02/13/19 1418 02/15/19 0337 02/16/19 0411  AST 20 16 19   ALT 15 12 13   ALKPHOS 98 79 80  BILITOT 0.7 0.5 0.7  PROT 7.4 6.3* 6.8  ALBUMIN 3.4* 2.9* 3.1*   No results for input(s): LIPASE, AMYLASE in the last 168 hours. No results for input(s): AMMONIA in the last 168 hours. Coagulation Profile: No results for input(s): INR, PROTIME in the last 168 hours. Cardiac Enzymes: No results for input(s): CKTOTAL, CKMB, CKMBINDEX, TROPONINI in the last 168 hours. BNP (last 3 results) No results for input(s): PROBNP in the last 8760 hours. HbA1C: No results for input(s): HGBA1C in the last 72 hours. CBG: No results for input(s): GLUCAP in the last 168 hours. Lipid Profile: No results for input(s): CHOL, HDL, LDLCALC, TRIG, CHOLHDL, LDLDIRECT in the last 72 hours. Thyroid Function Tests: No results for input(s): TSH, T4TOTAL, FREET4, T3FREE, THYROIDAB in the last 72 hours. Anemia Panel: No results for input(s): VITAMINB12, FOLATE, FERRITIN, TIBC, IRON, RETICCTPCT in the last 72 hours. Sepsis Labs: No results for input(s): PROCALCITON, LATICACIDVEN in the last 168 hours.  Recent Results (from the past 240 hour(s))  Urine culture     Status: Abnormal   Collection Time: 02/13/19 12:29 PM  Result Value Ref Range Status   Specimen Description   Final    URINE, CLEAN CATCH Performed at [facility], [place] [address], [place], [place] [address]    Special Requests   Final    NONE Performed at [facility], [place] [address] [place], [place] [address]    Culture >=100,000 COLONIES/mL PROTEUS MIRABILIS (A)  Final   Report Status 02/15/2019 FINAL  Final   Organism ID, Bacteria PROTEUS MIRABILIS (A)  Final      Susceptibility   Proteus mirabilis - MIC*    AMPICILLIN >=32 RESISTANT Resistant     CEFAZOLIN >=64 RESISTANT Resistant     CEFTRIAXONE <=1 SENSITIVE Sensitive     CIPROFLOXACIN <=0.25 SENSITIVE Sensitive     GENTAMICIN <=1 SENSITIVE Sensitive      IMIPENEM 4 SENSITIVE Sensitive     NITROFURANTOIN 128 RESISTANT Resistant     TRIMETH/SULFA <=20 SENSITIVE Sensitive     AMPICILLIN/SULBACTAM 8 SENSITIVE Sensitive     PIP/TAZO <=4 SENSITIVE Sensitive     * >=100,000 COLONIES/mL PROTEUS MIRABILIS  Culture, blood (  routine x 2)     Status: None (Preliminary result)   Collection Time: 02/15/19  4:54 PM  Result Value Ref Range Status   Specimen Description   Final    BLOOD RIGHT ANTECUBITAL Performed at [facility], [place] [address], [place], [place] [address]    Special Requests   Final    BOTTLES DRAWN AEROBIC ONLY Blood Culture adequate volume Performed at [facility] [address], [place], [place] [address]    Culture   Final    NO GROWTH < 12 HOURS Performed at [facility] [address], [place], [place] [address]    Report Status PENDING  Incomplete  Culture, blood (routine x 2)     Status: None (Preliminary result)   Collection Time: 02/15/19  4:54 PM  Result Value Ref Range Status   Specimen Description   Final    BLOOD RIGHT HAND Performed at [facility] [address], [place], [place] [address]    Special Requests   Final    BOTTLES DRAWN AEROBIC ONLY Blood Culture results may not be optimal due to an inadequate volume of blood received in culture bottles Performed at [facility] [address], [place], [place] [address]    Culture   Final    NO GROWTH < 12 HOURS Performed at [facility] [address] [place],  [address]    Report Status PENDING  Incomplete    Radiology Studies: Dg Chest Port 1 View  Result Date: 02/16/2019 CLINICAL DATA:  Shortness of breath. EXAM: PORTABLE CHEST 1 VIEW COMPARISON:  One-view chest x-ray 02/15/2019 FINDINGS: Heart size is exaggerated by low lung volumes. Right lower lobe airspace disease is again seen. There is minimal atelectasis at the left base. Pulmonary vascular congestion is noted.  The visualized soft tissues and bony thorax are unremarkable. IMPRESSION: 1. Low lung volumes with progressive right lower lobe airspace disease/pneumonia. Aspiration is also considered. 2. Moderate pulmonary vascular congestion. Electronically Signed   By: San Morelle M.D.   On: 02/16/2019 04:17   Dg Chest Port 1 View  Result Date: 02/15/2019 CLINICAL DATA:  85 year old with acute onset of shortness of breath. Current history of diastolic CHF and hypertension. Personal history of prostate cancer and colon cancer. EXAM: PORTABLE CHEST 1 VIEW COMPARISON:  11/30/2018 and earlier. FINDINGS: Suboptimal inspiration accounts for crowded bronchovascular markings, especially in the bases, and accentuates the cardiac silhouette. Taking this into account, cardiac silhouette mildly enlarged, unchanged. Airspace consolidation in the RIGHT lung base and associated RIGHT pleural effusion. Lungs otherwise clear. Pulmonary vascularity normal without evidence of pulmonary edema. No visible LEFT pleural effusion. IMPRESSION: Suboptimal inspiration. Acute pneumonia (favored over atelectasis) involving the RIGHT lung base with associated RIGHT pleural effusion. Electronically Signed   By: Evangeline Dakin M.D.   On: 02/15/2019 14:43   Scheduled Meds:  amLODipine  10 mg Oral Daily   citalopram  10 mg Oral Daily   furosemide  40 mg Oral Daily   guaiFENesin  1,200 mg Oral BID   ipratropium-albuterol  3 mL Nebulization TID   neomycin-polymyxin b-dexamethasone  1 drop Both Eyes Q4H   oxybutynin  2.5 mg Oral TID   tamsulosin  0.4 mg Oral Daily   Continuous Infusions:  ampicillin-sulbactam (UNASYN) IV 3 g (02/16/19 1007)   azithromycin 500 mg (02/15/19 1725)    LOS: 3 days   Kerney Elbe, DO [facility] PAGER is on [employer]  If 7PM-7AM, please contact night-coverage [email or web address] Password [facility] 02/16/2019, 2:06 PM

## 2019-02-16 NOTE — Care Management Important Message (Signed)
Important Message  Patient Details IM Letter given to Velva Harman Case Manager to present to the Patient Name: [name] MRN: [id number] Date of Birth: [birth year]   Medicare Important Message Given:  Yes    Kerin Salen 02/16/2019, 11:37 AM

## 2019-02-16 NOTE — Evaluation (Signed)
Clinical/Bedside Swallow Evaluation Patient Details  Name: [name] MRN: [id number] Date of Birth: 1924-10-22  Today's Date: 02/16/2019 Time: SLP Start Time (ACUTE ONLY): 8938 SLP Stop Time (ACUTE ONLY): 1742 SLP Time Calculation (min) (ACUTE ONLY): 19 min  Past Medical History:  Past Medical History:  Diagnosis Date  . Adenomatous polyps 09/09/2008  . Arthritis   . Carpal tunnel syndrome, bilateral 11/21/2016  . Colon cancer ([place]) dx'd 2008   surg only  . Diabetes mellitus without complication ([place])   . Falls frequently   . Hypertension   . Prostate cancer ([place]) dx'd 1993   surg only   Past Surgical History:  Past Surgical History:  Procedure Laterality Date  . CATARACT EXTRACTION W/PHACO Left 06/10/2013   Procedure: CATARACT EXTRACTION PHACO AND INTRAOCULAR LENS PLACEMENT (IOC);  Surgeon: Adonis Brook, MD;  Location: [facility];  Service: Ophthalmology;  Laterality: Left;  . COLONOSCOPY    . ESOPHAGOGASTRODUODENOSCOPY    . EYE SURGERY     cataract surgery, right eye  . laparoscopic assisted right hemicolectomy  09/21/07   HPI:  [name] is a 85 yo male adm [facility] with respiratory difficulties.  PMH + for prostate and colon cancer, hematuria x7 days, poor dentition, CHF, HTN, CKD 3.  [name] found to have right lower lobe ASD concerning for possible aspiration.  Swallow evaluation ordered.    Assessment / Plan / Recommendation Clinical Impression  Patient presents with functional oropharyngeal swallow ability. NO indications of aspiration or airway compromise with all po.  He passed 3 ounce Yale water test.  Poor dentition noted and [name] states he eats softer foods at home prior to admission.  He does admit to occasionally choking on liquids but denies this being a significant problem. Recommend dys3 diet and SLP follow up x1 to assure tolerance. Can not rule out silent aspiration but suspect not likely.  SLP Visit Diagnosis: Dysphagia, unspecified (R13.10)    Aspiration Risk  Mild  aspiration risk    Diet Recommendation Dysphagia 3 (Mech soft);Thin liquid   Liquid Administration via: Cup;Straw Medication Administration: Whole meds with liquid Supervision: Patient able to self feed Compensations: Slow rate;Small sips/bites Postural Changes: Seated upright at 90 degrees;Remain upright for at least 30 minutes after po intake    Other  Recommendations Oral Care Recommendations: Oral care BID   Follow up Recommendations        Frequency and Duration min 1 x/week  1 week       Prognosis Prognosis for Safe Diet Advancement: Fair      Swallow Study   General Date of Onset: 02/16/19 HPI: [name] is a 85 yo male adm to [facility] with respiratory difficulties.  PMH + for prostate and colon cancer, hematuria x7 days, poor dentition, CHF, HTN, CKD 3.  [name] found to have right lower lobe ASD concerning for possible aspiration.  Swallow evaluation ordered.  Type of Study: Bedside Swallow Evaluation Diet Prior to this Study: Dysphagia 3 (soft);Thin liquids Temperature Spikes Noted: No Respiratory Status: Room air History of Recent Intubation: No Behavior/Cognition: Alert;Cooperative;Pleasant mood Oral Cavity Assessment: Within Functional Limits Oral Care Completed by SLP: No Oral Cavity - Dentition: Missing dentition(missing dentition at frontal midline) Vision: Functional for self-feeding Self-Feeding Abilities: Able to feed self Patient Positioning: Upright in bed Baseline Vocal Quality: Normal Volitional Cough: Strong Volitional Swallow: Able to elicit    Oral/Motor/Sensory Function Overall Oral Motor/Sensory Function: Within functional limits   Ice Chips Ice chips: Not tested   Thin Liquid Thin Liquid:  Within functional limits Presentation: Straw    Nectar Thick Nectar Thick Liquid: Not tested   Honey Thick Honey Thick Liquid: Not tested   Puree Puree: Within functional limits Presentation: Self Fed;Spoon   Solid     Solid: Within functional limits Presentation:  Self Fredirick Lathe 02/16/2019,5:54 PM   Luanna Salk, [facility] SLP [place] Pager [phone] Office [phone]

## 2019-02-16 NOTE — Evaluation (Signed)
Physical Therapy Evaluation Patient Details Name: [name] MRN: [id number] DOB: 1923-12-31 Today's Date: 02/16/2019   History of Present Illness  85 year old male admitted with hematuria Dx: diastolic CHF. PMH: diastolic CHF, HTN, prostate cancer, colon cancer  Clinical Impression  The patient is very pleasant. Assist of 1 for mobility. Plans to return home with family caregivers. Pt admitted with above diagnosis. Pt currently with functional limitations due to the deficits listed below (see PT Problem List).  Pt will benefit from skilled PT to increase their independence and safety with mobility to allow discharge to the venue listed below.       Follow Up Recommendations Home health PT    Equipment Recommendations  None recommended by PT    Recommendations for Other Services       Precautions / Restrictions Precautions Precautions: Fall Precaution Comments: check sats      Mobility  Bed Mobility Overal bed mobility: Needs Assistance Bed Mobility: Supine to Sit     Supine to sit: Supervision     General bed mobility comments: no assist needed to sit up  Transfers Overall transfer level: Needs assistance Equipment used: Rolling walker (2 wheeled) Transfers: Sit to/from [facility] Sit to Stand: Min assist         General transfer comment: steady assist to rise from recliner. Assist to  walk 5' to recliner  Ambulation/Gait             General Gait Details: NT/tba  Stairs            Wheelchair Mobility    Modified Rankin (Stroke Patients Only)       Balance Overall balance assessment: Needs assistance Sitting-balance support: No upper extremity supported;Feet supported Sitting balance-Leahy Scale: Good     Standing balance support: Bilateral upper extremity supported;During functional activity Standing balance-Leahy Scale: Fair                               Pertinent Vitals/Pain Pain Assessment: No/denies  pain    Home Living Family/patient expects to be discharged to:: Private residence Living Arrangements: Children Available Help at Discharge: Available 24 hours/day Type of Home: House Home Access: Level entry     Home Layout: One level Home Equipment: Cane - single point;Walker - 2 wheels Additional Comments: Daughters assist him daily; has cane, RW (  , tub seat. Reports independence with BADLs; daughters assist with meals and transportation    Prior Function Level of Independence: Needs assistance   Gait / Transfers Assistance Needed: family assists PRN           Hand Dominance        Extremity/Trunk Assessment   Upper Extremity Assessment Upper Extremity Assessment: Defer to OT evaluation    Lower Extremity Assessment Lower Extremity Assessment: Generalized weakness    Cervical / Trunk Assessment Cervical / Trunk Assessment: Normal  Communication   Communication: No difficulties  Cognition Arousal/Alertness: Awake/alert Behavior During Therapy: WFL for tasks assessed/performed Overall Cognitive Status: Within Functional Limits for tasks assessed                                        General Comments      Exercises     Assessment/Plan    PT Assessment Patient needs continued PT services  PT Problem List Decreased strength;Decreased activity  tolerance;Decreased mobility;Decreased safety awareness;Decreased knowledge of use of DME       PT Treatment Interventions DME instruction;Therapeutic exercise;Functional mobility training;Gait training;Therapeutic activities;Patient/family education    PT Goals (Current goals can be found in the Care Plan section)  Acute Rehab PT Goals Patient Stated Goal: agreed to OOB PT Goal Formulation: With patient Time For Goal Achievement: 03/02/19 Potential to Achieve Goals: Good    Frequency Min 3X/week   Barriers to discharge        Co-evaluation               AM-PAC PT "6 Clicks"  Mobility  Outcome Measure Help needed turning from your back to your side while in a flat bed without using bedrails?: A Little Help needed moving from lying on your back to sitting on the side of a flat bed without using bedrails?: A Little Help needed moving to and from a bed to a chair (including a wheelchair)?: A Little Help needed standing up from a chair using your arms (e.g., wheelchair or bedside chair)?: A Little Help needed to walk in hospital room?: A Little Help needed climbing 3-5 steps with a railing? : A Little 6 Click Score: 18    End of Session   Activity Tolerance: Patient tolerated treatment well Patient left: in chair Nurse Communication: Mobility status PT Visit Diagnosis: Unsteadiness on feet (R26.81)    Time: 3428-7681 PT Time Calculation (min) (ACUTE ONLY): 21 min   Charges:   PT Evaluation $PT Eval Low Complexity: [place] Pager [phone] Office [phone] '  Claretha Cooper 02/16/2019, 10:37 AM

## 2019-02-16 NOTE — Progress Notes (Signed)
Pharmacy Antibiotic Note  [name] is a 85 y.o. male admitted on 02/13/2019 with aspiration PNA.  Pharmacy has been consulted for Unasyn dosing.  Plan: Unasyn 3g IV q12 for CrCl 15-29 Will continue to monitor renal function  Height: 5\' 4"  (162.6 cm) Weight: 186 lb 15.2 oz (84.8 kg) IBW/kg (Calculated) : 59.2  Temp (24hrs), Avg:97.8 F (36.6 C), Min:97.8 F (36.6 C), Max:97.8 F (36.6 C)  Recent Labs  Lab 02/13/19 1418 02/14/19 0406 02/15/19 0337 02/16/19 0411  WBC 4.5 3.3* 3.3* 3.7*  CREATININE 2.41* 2.25* 1.97* 2.00*    Estimated Creatinine Clearance: 23.1 mL/min (A) (by C-G formula based on SCr of 2 mg/dL (H)).    No Known Allergies  Thank you for allowing pharmacy to be a part of this patient's care.  Kara Mead 02/16/2019 8:52 AM

## 2019-02-17 ENCOUNTER — Inpatient Hospital Stay (HOSPITAL_COMMUNITY): Payer: Medicare Other

## 2019-02-17 DIAGNOSIS — J181 Lobar pneumonia, unspecified organism: Secondary | ICD-10-CM

## 2019-02-17 DIAGNOSIS — I5033 Acute on chronic diastolic (congestive) heart failure: Secondary | ICD-10-CM

## 2019-02-17 DIAGNOSIS — J90 Pleural effusion, not elsewhere classified: Secondary | ICD-10-CM

## 2019-02-17 LAB — PHOSPHORUS: Phosphorus: 3.4 mg/dL (ref 2.5–4.6)

## 2019-02-17 LAB — CBC WITH DIFFERENTIAL/PLATELET
Abs Immature Granulocytes: 0.03 10*3/uL (ref 0.00–0.07)
Basophils Absolute: 0 10*3/uL (ref 0.0–0.1)
Basophils Relative: 1 %
Eosinophils Absolute: 0.2 10*3/uL (ref 0.0–0.5)
Eosinophils Relative: 4 %
HCT: 30.4 % — ABNORMAL LOW (ref 39.0–52.0)
Hemoglobin: 9.6 g/dL — ABNORMAL LOW (ref 13.0–17.0)
Immature Granulocytes: 1 %
Lymphocytes Relative: 25 %
Lymphs Abs: 1.3 10*3/uL (ref 0.7–4.0)
MCH: 26.7 pg (ref 26.0–34.0)
MCHC: 31.6 g/dL (ref 30.0–36.0)
MCV: 84.4 fL (ref 80.0–100.0)
Monocytes Absolute: 0.4 10*3/uL (ref 0.1–1.0)
Monocytes Relative: 8 %
Neutro Abs: 3.1 10*3/uL (ref 1.7–7.7)
Neutrophils Relative %: 61 %
Platelets: 202 10*3/uL (ref 150–400)
RBC: 3.6 MIL/uL — ABNORMAL LOW (ref 4.22–5.81)
RDW: 15.1 % (ref 11.5–15.5)
WBC: 5 10*3/uL (ref 4.0–10.5)
nRBC: 0 % (ref 0.0–0.2)

## 2019-02-17 LAB — COMPREHENSIVE METABOLIC PANEL
ALT: 12 U/L (ref 0–44)
AST: 16 U/L (ref 15–41)
Albumin: 3 g/dL — ABNORMAL LOW (ref 3.5–5.0)
Alkaline Phosphatase: 77 U/L (ref 38–126)
Anion gap: 8 (ref 5–15)
BUN: 25 mg/dL — ABNORMAL HIGH (ref 8–23)
CO2: 22 mmol/L (ref 22–32)
Calcium: 8.9 mg/dL (ref 8.9–10.3)
Chloride: 109 mmol/L (ref 98–111)
Creatinine, Ser: 2.06 mg/dL — ABNORMAL HIGH (ref 0.61–1.24)
GFR calc Af Amer: 31 mL/min — ABNORMAL LOW (ref >60)
GFR calc non Af Amer: 27 mL/min — ABNORMAL LOW (ref >60)
Glucose, Bld: 82 mg/dL (ref 70–99)
Potassium: 3.8 mmol/L (ref 3.5–5.1)
Sodium: 139 mmol/L (ref 135–145)
Total Bilirubin: 0.3 mg/dL (ref 0.3–1.2)
Total Protein: 6.4 g/dL — ABNORMAL LOW (ref 6.5–8.1)

## 2019-02-17 LAB — IRON AND TIBC
Iron: 31 ug/dL — ABNORMAL LOW (ref 45–182)
Saturation Ratios: 14 % — ABNORMAL LOW (ref 17.9–39.5)
TIBC: 229 ug/dL — ABNORMAL LOW (ref 250–450)
UIBC: 198 ug/dL

## 2019-02-17 LAB — RETICULOCYTES
Immature Retic Fract: 17.3 % — ABNORMAL HIGH (ref 2.3–15.9)
RBC.: 3.6 MIL/uL — ABNORMAL LOW (ref 4.22–5.81)
Retic Count, Absolute: 38.5 10*3/uL (ref 19.0–186.0)
Retic Ct Pct: 1.1 % (ref 0.4–3.1)

## 2019-02-17 LAB — FOLATE: Folate: 7.6 ng/mL (ref >5.9)

## 2019-02-17 LAB — VITAMIN B12: Vitamin B-12: 908 pg/mL (ref 180–914)

## 2019-02-17 LAB — FERRITIN: Ferritin: 61 ng/mL (ref 24–336)

## 2019-02-17 LAB — MAGNESIUM: Magnesium: 2.1 mg/dL (ref 1.7–2.4)

## 2019-02-17 MED ORDER — FUROSEMIDE 10 MG/ML IJ SOLN
40.0000 mg | Freq: Once | INTRAMUSCULAR | Status: AC
  Administered 2019-02-17: 40 mg via INTRAVENOUS
  Filled 2019-02-17: qty 4

## 2019-02-17 MED ORDER — AZITHROMYCIN 250 MG PO TABS: 500.0000 mg | ORAL_TABLET | Freq: Every day | ORAL | Status: DC

## 2019-02-17 MED ORDER — IPRATROPIUM-ALBUTEROL 0.5-2.5 (3) MG/3ML IN SOLN: 3.0000 mL | Freq: Four times a day (QID) | RESPIRATORY_TRACT | 0 refills | Status: AC | PRN

## 2019-02-17 MED ORDER — AZITHROMYCIN 250 MG PO TABS: 500.0000 mg | ORAL_TABLET | Freq: Every day | ORAL | 0 refills | Status: AC

## 2019-02-17 MED ORDER — OXYBUTYNIN CHLORIDE 5 MG PO TABS: 2.5000 mg | ORAL_TABLET | Freq: Three times a day (TID) | ORAL | 0 refills | Status: DC

## 2019-02-17 MED ORDER — AMOXICILLIN-POT CLAVULANATE 500-125 MG PO TABS
1.0000 | ORAL_TABLET | Freq: Two times a day (BID) | ORAL | Status: DC
  Filled 2019-02-17: qty 1

## 2019-02-17 MED ORDER — GUAIFENESIN ER 600 MG PO TB12: 600.0000 mg | ORAL_TABLET | Freq: Two times a day (BID) | ORAL | 0 refills | Status: AC

## 2019-02-17 MED ORDER — AMOXICILLIN-POT CLAVULANATE 500-125 MG PO TABS: 1.0000 | ORAL_TABLET | Freq: Two times a day (BID) | ORAL | 0 refills | Status: AC

## 2019-02-17 MED ORDER — TAMSULOSIN HCL 0.4 MG PO CAPS: 0.4000 mg | ORAL_CAPSULE | Freq: Every day | ORAL | 0 refills | Status: AC

## 2019-02-17 NOTE — Progress Notes (Signed)
Discharge instructions given to pt and all questions were answered.  

## 2019-02-17 NOTE — Progress Notes (Signed)
Ambulated pt 100 ft. Pt maintained oxygen sats above 93%.

## 2019-02-17 NOTE — Discharge Summary (Signed)
Physician Discharge Summary  [name] GBT:[id number] DOB: [birth year] DOA: 02/13/2019  PCP: Lucianne Lei, MD  Admit date: 02/13/2019 Discharge date: 02/17/2019  Admitted From: Home Disposition: Home with Home Health PT OT  Recommendations for Outpatient Follow-up:  1. Follow up with PCP in 1-2 weeks 2. Follow up with Dr. Alen Blew in Oncology 3. Follow up with Dr. Noah Delaine in Urology 4. Repeat CXR in 3-6 weeks  5. Please obtain CMP/CBC, Mag, Phos in one week 6. Please follow up on the following pending results:  Home Health: Yes Equipment/Devices: DME Nebulizer; No Equipment recommended by PT/OT  Discharge Condition: Stable CODE STATUS: FULL CODE Diet recommendation: Heart Healthy Soft Diet   Brief/Interim Summary HPI per Dr. Riccardo Dubin Arrien on 02/13/2019 Ethin Webster a 85 y.o.malewith medical history significant ofdiastolic heart failure,CKD 3B ,hypertension, history of prostate cancer and colon cancer.He reports 7 days of dysuria,consistent with sharp pain, 10 out of 10inintensity,specifically at thelower abdomen andperineum, mainly while urinating or moving his bowels. It has been associated with hematuria, fatigue,bowel incontinence and generalized weakness. No improving factors. Hissymptoms have been persistent for the last 7 days.  His prostate cancer he has beentreated withsupportive care and androgen deprivation.Follows with Dr. Alen Blew and Dr. Alyson Ingles.  Patient ambulates with the help of the walker and he lives with his daughter.  ED Course:He was found deconditioning and ill looking appearing, also has had hematuria, CT of the abdomen and pelvis showed a large multilobulated mass within the urinary bladder, concerning for bladder carcinoma potentially extension of enlarged prostate gland/prostate carcinoma.He was referred for admission and further medical management.   **Interim History IV fluids discontinued. Urology evaluated and and  recommending against Foley catheter at this time.  Patient is to see Dr. Alyson Ingles in the outpatient setting for a TURBT. He is improving And Urine Cx grew out Proteus Mirabilis that was resistant to Ampicillin, Cefazolin, and Nitrofurantoin. PT and OT evaluated and recommending Home health. Hospitalization has been complicated some mild Wheezing and CXR revealed a PNA and Pleural Effusion. Home Lasix was restarted and Repeat CXR with concern for Aspiration so SLP was consulted and Abx were changed to Unasyn further narrowed to Augmentin and Zithromax in.  Prior to discharge patient was given additional dose of Lasix as his repeat chest x-ray appeared to have some vascular congestion.  Patient ambulated the halls and did not desaturate.  He is deemed medically stable to be discharged and follow-up with PCP, urology, as well as medical oncology in outpatient setting.  Discharge Diagnoses:  Active Problems:   PROSTATE CANCER   Essential hypertension   Diastolic CHF (HCC)   Hematuria   AKI (acute kidney injury) ([facility])   CKD stage G3b/A2, GFR 30-44 and albumin creatinine ratio 30-299 mg/g (HCC)  Progressive prostate cancer complicated by Obstructive uropathy Proteus Mirabilis Urinary tract infection Hematuria Right Hydronephrosis Acute Kidney Injury on CKD stage 3B, improving -Patientwill be admitted to the medical ward -CT of the Abdomen and Pelvis showed "Continued presence of large multilobulated mass within the urinary bladder concerning for bladder carcinoma or potentially extension of enlarged prostate gland or prostate carcinoma. This results in moderate right hydroureteronephrosis which is increased compared to prior exam. Bilateral nephrolithiasis is noted. Stable sclerotic lesions are seen in the lumbar spine and left iliac bone concerning for metastatic disease. Stable 4.1 x 2.1 cm cystic lesion seen involving the uncinate process of the pancreas most likely representing low grade neoplasm or  benign lesion." -U/A Showed cloudy appearance with large hemoglobin on  the urine dipstick, positive nitrites, protein of 100, many bacteria, greater than 50 RBCs per high-power field, greater than 50 WBCs, large leukocytes -Urine Cx showed >100,000 CFU of Proteus Mirabilis with  -Started antibiotic therapy with IV Ceftriaxone; Resistance was only to Ampicillin, Cefazolin, and Nitrofurantoin; Abx changed to IV Unasyn given his suspected Aspiration PNA and will continue Augmentin at discharge -Follow-up on cultures, cell count and temperature curve. Unfortunately Blood Cx2 were never obtained on admission so they were ordered and showed NGTD at 2 Days -Gentle hydration with isotonic saline at 75 mL/hr stopped  -Will follow-up on kidney function in the morning, avoid hypotension andnephrotoxic agents.  -His calculated GFR is 33, consistent with chronic kidney disease stage III. BUN/Cr went from 32/2.41 -> 31/2.25 -> 28/1.97 -> 23/2.00 -> 25/2.06 -Continue to Monitor for S/Sx of Bleeding as patient is having Hematuria and continue to Monitor CBC -WBC went from 4.5 -> 3.3 -> 3.7 -> 5.0 -Hgb/Hct went from 11.0/34.4 -> 10.5/34.0 -> 9.9/31.3 -> 10.6/33.9 -> 9.6/30.4 -Consulted Urology this AM for further recommendations in regards of progressive Prostate Cancer.  -Strict I's/O's; Was started on Tamsulosin 0.4 mg po Daily and also on Oxybutynin 2.5 mg po TID -Dr. Alinda Money of Urology evaluated and the patient is known to Dr. Alyson Ingles and is in the process of scheduling for a TUR UBT for further evaluation.  Urine this morning was slightly dark but neurology feels that is grossly clear no indication for indwelling catheter acute intervention at this time -Patient's Hydronephrosis is likely related to bladder mass and Urology recommends no acute indication for intervention and renal function has improved with hydration and is approaching closer to baseline -Will need PT/OT to evaluate and Treat and they are  recommending Home Health  -Parenteral resume -Patient will need to follow-up with oncology and urology in the outpatient setting along with PCP  Acute on Chronic Diastolic Heart Failure. Ejection fraction left ventricle 60-65%.Moderate focal basal hypertrophy of the septum.February 2019. -Patient hemodynamically stable,  -Appeared mildly hypovolemic on admission; Gentle IVF Hydration with NS at 75 mL/hr now stopped  he appeared volume overloaded -CXR this AM showed  Cardiomegaly and moderate pulmonary vascular congestion along with aortic atherosclerosis and a right pleural effusion and mild right basilar airspace disease at left.  Reflect atelectasis versus infection -Resumed Home Lasix yesterday; Currently still holding Entresto -Will continue Amlodipine.  -Strict I's/O's and Daily Weights; Patient is -2,205 mL since admission -Weight is 182 on Admission and on repeat is 186.95 lbs and now 183 -And IV Lasix 40 mg x 1 prior to discharge -Continue to Monitor for S/Sx of Volume Overload  -ASA currently being held due to Hematuria but now resumed  Hypertension -Will continue Blood pressure control with Amlodipine 10 mg po Daily -Currently hold Entresto and resume at D/C -Diuretic with Lasix resumed. Flude  Depression -Continue Citalopram 10 mg po Daily   Obesity -Estimated body mass index is 31.45 kg/m as calculated from the following:   Height as of this encounter: 5\' 4"  (1.626 m).   Weight as of this encounter: 83.1 kg. -Weight Loss and Dietary Counseling given    Pancreatic Lesion -CT Scan showed Stable 4.1 x 2.1 cm cystic lesion seen involving the uncinate process of the pancreas most likely representing low grade neoplasm or benign lesion.  -Outpatient Follow-up with abdominal MRI with and without gadolinium may be performed for further evaluation on non-emergent basis. -Follow up with Dr. Alen Blew and PCP  Wheezing from CAP vs. Aspiration  PNA and Right Pleural  Effusion -Patient had Right sided wheezing during the admission -Added DuoNeb 3 mL BID  -Add Incentive Spirometry  -CXR reviewed and shows Right Sided PNA -Check Blood Cx x2 show NGTD at 2 Days -Currently Afebrile and WBC is 5.0 -Patient is coughing but not hypoxic -Started IV Ceftriaxone and add IV Azithromycin for CAP Coverage but changed IV Ceftriaxone to IV Unasyn  further changed to p.o. Augmentin.  Will change to p.o. azithromycin and continue antibiotics for 3 more days -Add Flutter Valve, Guaifenesin  -Currently not requiring Supplemental O2 via [place] -Will need to repeat Ambulatory Home O2 Screen Prior to D/C as he was Hypoxic Yesterday while ambulating as O2 dropped to 88%; Repeat Home O2 Screen today showed that he did not desaturate -Stopped IVF; Home Lasix Resumed and patient was given a Dose of IV Lasix prior to D/C -Repeat CXR yesterday AM showed Low Lung Volumes with progressive Right Lower Lobe Airspace Disease/PNA and Moderate Pulmonary Vascular Congestion;  -Aspiration is considered given his Age and location of Pneumonia so Abx changed as above and SLP recommended Soft Diet and Low Risk for Aspiration -PT/OT/SLP to evaluate and Treat; PT recommending Home Health PT  -Repeat chest x-ray in 3 to 6 weeks -Follow-up with PCP  Normocytic Anemia -Patient's Hb/Hct went from 11.0/34.4 -> 10.5/34.0 -> 9.9/31.3 -> 10.6/33.9 -> 9.6/30.4 -In the setting of Dilutional Drop versus hematuria -Checked Anemia Panel and showed iron level of 31, U IBC of 198, TIBC of 229, saturation ratios of 14%, ferritin level 61, folate level 7.6, and vitamin B12 level of 908 -Continue to Monitor for S/Sx of Bleeding as he had hematuria earlier -Repeat CBC as an outpatient   Metabolic Acidosis, improved  -Mild and Likely in the setting of Renal Dysfunction -CO2 was 20 and AG was 7; Now CO2 is 23 and AG is 8 -Continue to Monitor and repeat BMP in AM   Discharge Instructions  Discharge Instructions     Call MD for:  difficulty breathing, headache or visual disturbances   Complete by:  As directed    Call MD for:  extreme fatigue   Complete by:  As directed    Call MD for:  hives   Complete by:  As directed    Call MD for:  persistant dizziness or light-headedness   Complete by:  As directed    Call MD for:  persistant nausea and vomiting   Complete by:  As directed    Call MD for:  redness, tenderness, or signs of infection (pain, swelling, redness, odor or green/yellow discharge around incision site)   Complete by:  As directed    Call MD for:  severe uncontrolled pain   Complete by:  As directed    Call MD for:  temperature >100.4   Complete by:  As directed    DME Nebulizer/meds   Complete by:  As directed    Patient needs a nebulizer to treat with the following condition:  Pneumonia   Diet - low sodium heart healthy   Complete by:  As directed    Discharge instructions   Complete by:  As directed    You were cared for by a hospitalist during your hospital stay. If you have any questions about your discharge medications or the care you received while you were in the hospital after you are discharged, you can call the unit and ask to speak with the hospitalist on call if the hospitalist that took care  of you is not available. Once you are discharged, your primary care physician will handle any further medical issues. Please note that NO REFILLS for any discharge medications will be authorized once you are discharged, as it is imperative that you return to your primary care physician (or establish a relationship with a primary care physician if you do not have one) for your aftercare needs so that they can reassess your need for medications and monitor your lab values.  Follow up with PCP, Urology, and Cardiology as an outpatient. Take all medications as prescribed. If symptoms change or worsen please return to the ED for evaluation   Increase activity slowly   Complete by:  As  directed      Allergies as of 02/17/2019   No Known Allergies     Medication List    TAKE these medications   amLODipine 10 MG tablet Commonly known as:  NORVASC Take 10 mg by mouth daily.   amoxicillin-clavulanate 500-125 MG tablet Commonly known as:  AUGMENTIN Take 1 tablet (500 mg total) by mouth 2 (two) times daily for 3 days.   aspirin EC 81 MG tablet Take 1 tablet (81 mg total) by mouth daily.   azithromycin 250 MG tablet Commonly known as:  ZITHROMAX Take 2 tablets (500 mg total) by mouth daily for 3 days.   citalopram 10 MG tablet Commonly known as:  CELEXA Take 10 mg by mouth daily.   Entresto 24-26 MG Generic drug:  sacubitril-valsartan Take 1 tablet by mouth 2 (two) times daily.   furosemide 40 MG tablet Commonly known as:  LASIX Take 1 tablet (40 mg total) by mouth daily for 30 days. What changed:    when to take this  reasons to take this   guaiFENesin 600 MG 12 hr tablet Commonly known as:  MUCINEX Take 1 tablet (600 mg total) by mouth 2 (two) times daily for 14 doses.   ipratropium-albuterol 0.5-2.5 (3) MG/3ML Soln Commonly known as:  DUONEB Take 3 mLs by nebulization every 6 (six) hours as needed.   oxybutynin 5 MG tablet Commonly known as:  DITROPAN Take 0.5 tablets (2.5 mg total) by mouth 3 (three) times daily.   polyethylene glycol 17 g packet Commonly known as:  MIRALAX / GLYCOLAX Take 17 g by mouth daily as needed for mild constipation.   tamsulosin 0.4 MG Caps capsule Commonly known as:  FLOMAX Take 1 capsule (0.4 mg total) by mouth daily. Start taking on:  February 18, 2019            Durable Medical Equipment  (From admission, onward)         Start     Ordered   02/17/19 0000  DME Nebulizer/meds    Question:  Patient needs a nebulizer to treat with the following condition  Answer:  Pneumonia   02/17/19 1324         Follow-up Information    Lucianne Lei, MD. Call.   Specialty:  Family Medicine Why:  Follow up  within 1 week Contact information: [address] [place] [address] [phone]        Wyatt Portela, MD. Call.   Specialty:  Oncology Why:  Follow up within 1 week Contact information: [place] [address] [phone]        Cleon Gustin, MD. Call.   Specialty:  Urology Why:  Follow up within 1 week  Contact information: [place]  [address] [phone]          No Known Allergies  Consultations:  Urology   Procedures/Studies: Ct Abdomen Pelvis Wo Contrast  Result Date: 02/13/2019 CLINICAL DATA:  Acute lower abdominal pain, constipation. EXAM: CT ABDOMEN AND PELVIS WITHOUT CONTRAST TECHNIQUE: Multidetector CT imaging of the abdomen and pelvis was performed following the standard protocol without IV contrast. COMPARISON:  CT scan of November 07, 2018. FINDINGS: Lower chest: No acute abnormality. Hepatobiliary: No focal liver abnormality is seen. No gallstones, gallbladder wall thickening, or biliary dilatation. Pancreas: 4.1 x 2.1 cm cystic lesion is seen involving uncinate process of the pancreas which is not significantly changed compared to prior exam. No ductal dilatation or inflammation is noted. Spleen: Normal in size without focal abnormality. Adrenals/Urinary Tract: Adrenal glands appear normal. Bilateral nephrolithiasis is noted. Moderate right hydronephrosis is noted which appears to be due to obstruction from large lobulated bladder mass. No hydronephrosis or renal obstruction is noted on the left. The bladder mass is highly concerning for bladder malignancy or possibly extension from prostate gland. Stomach/Bowel: The stomach is unremarkable. Postsurgical changes are seen involving the right colon. There is no evidence of bowel obstruction or inflammation. Vascular/Lymphatic: Aortic atherosclerosis. No enlarged abdominal or pelvic lymph nodes. Reproductive: Enlarged prostate gland is noted and potentially  extending into inferior portion of urinary bladder. Other: No abdominal wall hernia or abnormality. No abdominopelvic ascites. Musculoskeletal: Stable sclerotic lesions are seen involving the left iliac wing posteriorly as well as the L5 vertebral body consistent with metastatic disease. IMPRESSION: Continued presence of large multilobulated mass within the urinary bladder concerning for bladder carcinoma or potentially extension of enlarged prostate gland or prostate carcinoma. This results in moderate right hydroureteronephrosis which is increased compared to prior exam. Bilateral nephrolithiasis is noted. Stable sclerotic lesions are seen in the lumbar spine and left iliac bone concerning for metastatic disease. Stable 4.1 x 2.1 cm cystic lesion seen involving the uncinate process of the pancreas most likely representing low grade neoplasm or benign lesion. Follow-up with abdominal MRI with and without gadolinium may be performed for further evaluation on nonemergent basis. Electronically Signed   By: Marijo Conception M.D.   On: 02/13/2019 15:34   Dg Chest Port 1 View  Result Date: 02/17/2019 CLINICAL DATA:  Shortness of breath. EXAM: PORTABLE CHEST 1 VIEW COMPARISON:  One-view chest x-ray 02/16/2019 FINDINGS: Low lung volumes exaggerate the heart size. Atherosclerotic calcifications are present at the aortic arch. Moderate pulmonary vascular congestion is present bilaterally without edema. A right pleural effusion is suspected. Left lung base is clear. IMPRESSION: 1. Cardiomegaly and moderate pulmonary vascular congestion. 2. Aortic atherosclerosis. 3. Right pleural effusion. 4. Mild right basilar airspace disease likely reflects atelectasis. Infection is not excluded. Electronically Signed   By: San Morelle M.D.   On: 02/17/2019 06:34   Dg Chest Port 1 View  Result Date: 02/16/2019 CLINICAL DATA:  Shortness of breath. EXAM: PORTABLE CHEST 1 VIEW COMPARISON:  One-view chest x-ray 02/15/2019  FINDINGS: Heart size is exaggerated by low lung volumes. Right lower lobe airspace disease is again seen. There is minimal atelectasis at the left base. Pulmonary vascular congestion is noted. The visualized soft tissues and bony thorax are unremarkable. IMPRESSION: 1. Low lung volumes with progressive right lower lobe airspace disease/pneumonia. Aspiration is also considered. 2. Moderate pulmonary vascular congestion. Electronically Signed   By: San Morelle M.D.   On: 02/16/2019 04:17   Dg Chest Port 1 View  Result Date: 02/15/2019 CLINICAL DATA:  85 year old with acute onset of shortness of breath. Current history of diastolic CHF and hypertension. Personal history of prostate cancer and colon cancer. EXAM: PORTABLE CHEST 1 VIEW COMPARISON:  11/30/2018 and earlier. FINDINGS: Suboptimal inspiration accounts for crowded bronchovascular markings, especially in the bases, and accentuates the cardiac silhouette. Taking this into account, cardiac silhouette mildly enlarged, unchanged. Airspace consolidation in the RIGHT lung base and associated RIGHT pleural effusion. Lungs otherwise clear. Pulmonary vascularity normal without evidence of pulmonary edema. No visible LEFT pleural effusion. IMPRESSION: Suboptimal inspiration. Acute pneumonia (favored over atelectasis) involving the RIGHT lung base with associated RIGHT pleural effusion. Electronically Signed   By: Evangeline Dakin M.D.   On: 02/15/2019 14:43      Subjective: Examined at bedside and he states that he is feeling better was still coughing a little bit but not hypoxic.  Able to urinate and urine is improved.  No burning or discomfort now.  Denies any chest pain, lightheadedness or dizziness.  No shortness of breath on ambulation.  Stable for discharge home will need to follow-up with PCP, oncology as well as urology and patient is understandable and agreeable to plan.  I updated patient's daughter and she also understands the plan of  care.  Discharge Exam: Vitals:   02/17/19 0839 02/17/19 1301  BP:  (!) 168/71  Pulse:  81  Resp:  18  Temp:  97.7 F (36.5 C)  SpO2: 96% 98%   Vitals:   02/17/19 0455 02/17/19 0459 02/17/19 0839 02/17/19 1301  BP:  (!) 145/63  (!) 168/71  Pulse:  64  81  Resp:  20  18  Temp:  97.6 F (36.4 C)  97.7 F (36.5 C)  TempSrc:  Oral    SpO2:  95% 96% 98%  Weight: 83.1 kg     Height:       General: Pt is alert, awake, not in acute distress Cardiovascular: RRR, S1/S2 +, no rubs, no gallops Respiratory: Diminished bilaterally, no appreciable wheezing today, mild Right Lower rhonchi and Crackles. Unlabored breathing Abdominal: Soft, NT, ND, bowel sounds + Extremities: Trace edema, no cyanosis  The results of significant diagnostics from this hospitalization (including imaging, microbiology, ancillary and laboratory) are listed below for reference.    Microbiology: Recent Results (from the past 240 hour(s))  Urine culture     Status: Abnormal   Collection Time: 02/13/19 12:29 PM  Result Value Ref Range Status   Specimen Description   Final    URINE, CLEAN CATCH Performed at [facility] [address], [place], [place] [address]    Special Requests   Final    NONE Performed at [facility], [place] [address], [place], [place] [address]    Culture >=100,000 COLONIES/mL PROTEUS MIRABILIS (A)  Final   Report Status 02/15/2019 FINAL  Final   Organism ID, Bacteria PROTEUS MIRABILIS (A)  Final      Susceptibility   Proteus mirabilis - MIC*    AMPICILLIN >=32 RESISTANT Resistant     CEFAZOLIN >=64 RESISTANT Resistant     CEFTRIAXONE <=1 SENSITIVE Sensitive     CIPROFLOXACIN <=0.25 SENSITIVE Sensitive     GENTAMICIN <=1 SENSITIVE Sensitive     IMIPENEM 4 SENSITIVE Sensitive     NITROFURANTOIN 128 RESISTANT Resistant     TRIMETH/SULFA <=20 SENSITIVE Sensitive     AMPICILLIN/SULBACTAM 8 SENSITIVE Sensitive     PIP/TAZO <=4 SENSITIVE Sensitive      * >=100,000 COLONIES/mL PROTEUS MIRABILIS  Culture, blood (routine  x 2)     Status: None (Preliminary result)   Collection Time: 02/15/19  4:54 PM  Result Value Ref Range Status   Specimen Description   Final    BLOOD RIGHT ANTECUBITAL Performed at [facility], [place] [address] [place], [place] [address]    Special Requests   Final    BOTTLES DRAWN AEROBIC ONLY Blood Culture adequate volume Performed at [facility] [address] [place], [place] [address]    Culture   Final    NO GROWTH 2 DAYS Performed at [facility] [address], [place], [place] [address]    Report Status PENDING  Incomplete  Culture, blood (routine x 2)     Status: None (Preliminary result)   Collection Time: 02/15/19  4:54 PM  Result Value Ref Range Status   Specimen Description   Final    BLOOD RIGHT HAND Performed at [facility] [address] [place], [place] [address]    Special Requests   Final    BOTTLES DRAWN AEROBIC ONLY Blood Culture results may not be optimal due to an inadequate volume of blood received in culture bottles Performed at [facility] [address], [place], [place] [address]    Culture   Final    NO GROWTH 2 DAYS Performed at [facility] [address], [place], [place] [address]    Report Status PENDING  Incomplete    Labs: BNP (last 3 results) Recent Labs    12/20/18 0450  BNP 063.0*   Basic Metabolic Panel: Recent Labs  Lab 02/13/19 1418 02/14/19 0406 02/15/19 0337 02/16/19 0411 02/17/19 0418  NA 142 141 140 140 139  K 4.2 4.2 3.7 4.8 3.8  CL 111 111 113* 111 109  CO2 22 22 20* 21* 22  GLUCOSE 74 98 99 88 82  BUN 32* 31* 28* 23 25*  CREATININE 2.41* 2.25* 1.97* 2.00* 2.06*  CALCIUM 9.3 9.0 8.7* 9.2 8.9  MG  --   --  2.2 2.3 2.1  PHOS  --   --  3.2 3.2 3.4   Liver Function Tests: Recent Labs  Lab 02/13/19 1418 02/15/19 0337 02/16/19 0411 02/17/19 0418  AST 20  16 19 16   ALT 15 12 13 12   ALKPHOS 98 79 80 77  BILITOT 0.7 0.5 0.7 0.3  PROT 7.4 6.3* 6.8 6.4*  ALBUMIN 3.4* 2.9* 3.1* 3.0*   No results for input(s): LIPASE, AMYLASE in the last 168 hours. No results for input(s): AMMONIA in the last 168 hours. CBC: Recent Labs  Lab 02/13/19 1418 02/14/19 0406 02/15/19 0337 02/16/19 0411 02/17/19 0418  WBC 4.5 3.3* 3.3* 3.7* 5.0  NEUTROABS 2.2  --  1.6* 1.8 3.1  HGB 11.0* 10.5* 9.9* 10.6* 9.6*  HCT 34.4* 34.0* 31.3* 33.9* 30.4*  MCV 82.7 84.4 84.8 86.3 84.4  PLT 228 216 198 194 202   Cardiac Enzymes: No results for input(s): CKTOTAL, CKMB, CKMBINDEX, TROPONINI in the last 168 hours. BNP: Invalid input(s): POCBNP CBG: No results for input(s): GLUCAP in the last 168 hours. D-Dimer No results for input(s): DDIMER in the last 72 hours. Hgb A1c No results for input(s): HGBA1C in the last 72 hours. Lipid Profile No results for input(s): CHOL, HDL, LDLCALC, TRIG, CHOLHDL, LDLDIRECT in the last 72 hours. Thyroid function studies No results for input(s): TSH, T4TOTAL, T3FREE, THYROIDAB in the last 72 hours.  Invalid input(s): FREET3 Anemia work up [employer]  02/17/19 0418  VITAMINB12 908  FOLATE 7.6  FERRITIN 61  TIBC 229*  IRON 31*  RETICCTPCT 1.1   Urinalysis    Component Value Date/Time   COLORURINE YELLOW 02/13/2019 1229   APPEARANCEUR CLOUDY (A) 02/13/2019 1229   LABSPEC 1.010 02/13/2019 1229   PHURINE 8.0 02/13/2019 1229   GLUCOSEU NEGATIVE 02/13/2019 1229   HGBUR LARGE (A) 02/13/2019 1229   BILIRUBINUR NEGATIVE 02/13/2019 1229   KETONESUR NEGATIVE 02/13/2019 1229   PROTEINUR 100 (A) 02/13/2019 1229   UROBILINOGEN 1.0 04/07/2010 1747   NITRITE POSITIVE (A) 02/13/2019 1229   LEUKOCYTESUR LARGE (A) 02/13/2019 1229   Sepsis Labs Invalid input(s): PROCALCITONIN,  WBC,  LACTICIDVEN Microbiology Recent Results (from the past 240 hour(s))  Urine culture     Status: Abnormal   Collection Time: 02/13/19 12:29 PM   Result Value Ref Range Status   Specimen Description   Final    URINE, CLEAN CATCH Performed at [facility], [place] [address] [place], [place] [address]    Special Requests   Final    NONE Performed at [facility], [place] [address] [place], [place] [address]    Culture >=100,000 COLONIES/mL PROTEUS MIRABILIS (A)  Final   Report Status 02/15/2019 FINAL  Final   Organism ID, Bacteria PROTEUS MIRABILIS (A)  Final      Susceptibility   Proteus mirabilis - MIC*    AMPICILLIN >=32 RESISTANT Resistant     CEFAZOLIN >=64 RESISTANT Resistant     CEFTRIAXONE <=1 SENSITIVE Sensitive     CIPROFLOXACIN <=0.25 SENSITIVE Sensitive     GENTAMICIN <=1 SENSITIVE Sensitive     IMIPENEM 4 SENSITIVE Sensitive     NITROFURANTOIN 128 RESISTANT Resistant     TRIMETH/SULFA <=20 SENSITIVE Sensitive     AMPICILLIN/SULBACTAM 8 SENSITIVE Sensitive     PIP/TAZO <=4 SENSITIVE Sensitive     * >=100,000 COLONIES/mL PROTEUS MIRABILIS  Culture, blood (routine x 2)     Status: None (Preliminary result)   Collection Time: 02/15/19  4:54 PM  Result Value Ref Range Status   Specimen Description   Final    BLOOD RIGHT ANTECUBITAL Performed at [facility] [address], [place], [place] [address]    Special Requests   Final    BOTTLES DRAWN AEROBIC ONLY Blood Culture adequate volume Performed at [facility] [address], [place], [place] [address]    Culture   Final    NO GROWTH 2 DAYS Performed at [facility] [address] [place], [place] [address]    Report Status PENDING  Incomplete  Culture, blood (routine x 2)     Status: None (Preliminary result)   Collection Time: 02/15/19  4:54 PM  Result Value Ref Range Status   Specimen Description   Final    BLOOD RIGHT HAND Performed at [facility] [address], [place], [place] [address]    Special Requests   Final    BOTTLES DRAWN AEROBIC ONLY Blood Culture  results may not be optimal due to an inadequate volume of blood received in culture bottles Performed at [facility] [address] [place], [place] [address]    Culture   Final    NO GROWTH 2 DAYS Performed at [facility] [address], [place], [place] [address]    Report Status PENDING  Incomplete   Time coordinating discharge: 35 minutes  SIGNED:  Kerney Elbe, DO [facility] 02/17/2019, 1:27 PM Pager is on [place]  If 7PM-7AM, please contact night-coverage [email or web address] Password [email or web address]

## 2019-02-19 DIAGNOSIS — Z85038 Personal history of other malignant neoplasm of large intestine: Secondary | ICD-10-CM | POA: Diagnosis not present

## 2019-02-19 DIAGNOSIS — Z7982 Long term (current) use of aspirin: Secondary | ICD-10-CM | POA: Diagnosis not present

## 2019-02-19 DIAGNOSIS — I130 Hypertensive heart and chronic kidney disease with heart failure and stage 1 through stage 4 chronic kidney disease, or unspecified chronic kidney disease: Secondary | ICD-10-CM | POA: Diagnosis not present

## 2019-02-19 DIAGNOSIS — I5033 Acute on chronic diastolic (congestive) heart failure: Secondary | ICD-10-CM | POA: Diagnosis not present

## 2019-02-19 DIAGNOSIS — M6281 Muscle weakness (generalized): Secondary | ICD-10-CM | POA: Diagnosis not present

## 2019-02-19 DIAGNOSIS — G5603 Carpal tunnel syndrome, bilateral upper limbs: Secondary | ICD-10-CM | POA: Diagnosis not present

## 2019-02-19 DIAGNOSIS — Z792 Long term (current) use of antibiotics: Secondary | ICD-10-CM | POA: Diagnosis not present

## 2019-02-19 DIAGNOSIS — N390 Urinary tract infection, site not specified: Secondary | ICD-10-CM | POA: Diagnosis not present

## 2019-02-19 DIAGNOSIS — E1122 Type 2 diabetes mellitus with diabetic chronic kidney disease: Secondary | ICD-10-CM | POA: Diagnosis not present

## 2019-02-19 DIAGNOSIS — N130 Hydronephrosis with ureteropelvic junction obstruction: Secondary | ICD-10-CM | POA: Diagnosis not present

## 2019-02-19 DIAGNOSIS — J189 Pneumonia, unspecified organism: Secondary | ICD-10-CM | POA: Diagnosis not present

## 2019-02-19 DIAGNOSIS — Z87891 Personal history of nicotine dependence: Secondary | ICD-10-CM | POA: Diagnosis not present

## 2019-02-19 DIAGNOSIS — N183 Chronic kidney disease, stage 3 (moderate): Secondary | ICD-10-CM | POA: Diagnosis not present

## 2019-02-19 DIAGNOSIS — Z9181 History of falling: Secondary | ICD-10-CM | POA: Diagnosis not present

## 2019-02-19 DIAGNOSIS — B964 Proteus (mirabilis) (morganii) as the cause of diseases classified elsewhere: Secondary | ICD-10-CM | POA: Diagnosis not present

## 2019-02-19 DIAGNOSIS — M1990 Unspecified osteoarthritis, unspecified site: Secondary | ICD-10-CM | POA: Diagnosis not present

## 2019-02-19 DIAGNOSIS — I509 Heart failure, unspecified: Secondary | ICD-10-CM | POA: Diagnosis not present

## 2019-02-20 DIAGNOSIS — N183 Chronic kidney disease, stage 3 (moderate): Secondary | ICD-10-CM | POA: Diagnosis not present

## 2019-02-20 DIAGNOSIS — M6281 Muscle weakness (generalized): Secondary | ICD-10-CM | POA: Diagnosis not present

## 2019-02-20 DIAGNOSIS — Z85038 Personal history of other malignant neoplasm of large intestine: Secondary | ICD-10-CM | POA: Diagnosis not present

## 2019-02-20 DIAGNOSIS — N130 Hydronephrosis with ureteropelvic junction obstruction: Secondary | ICD-10-CM | POA: Diagnosis not present

## 2019-02-20 DIAGNOSIS — J189 Pneumonia, unspecified organism: Secondary | ICD-10-CM | POA: Diagnosis not present

## 2019-02-20 DIAGNOSIS — G5603 Carpal tunnel syndrome, bilateral upper limbs: Secondary | ICD-10-CM | POA: Diagnosis not present

## 2019-02-20 DIAGNOSIS — I5033 Acute on chronic diastolic (congestive) heart failure: Secondary | ICD-10-CM | POA: Diagnosis not present

## 2019-02-20 DIAGNOSIS — E1122 Type 2 diabetes mellitus with diabetic chronic kidney disease: Secondary | ICD-10-CM | POA: Diagnosis not present

## 2019-02-20 DIAGNOSIS — Z9181 History of falling: Secondary | ICD-10-CM | POA: Diagnosis not present

## 2019-02-20 DIAGNOSIS — I509 Heart failure, unspecified: Secondary | ICD-10-CM | POA: Diagnosis not present

## 2019-02-20 DIAGNOSIS — M1990 Unspecified osteoarthritis, unspecified site: Secondary | ICD-10-CM | POA: Diagnosis not present

## 2019-02-20 DIAGNOSIS — Z87891 Personal history of nicotine dependence: Secondary | ICD-10-CM | POA: Diagnosis not present

## 2019-02-20 DIAGNOSIS — I130 Hypertensive heart and chronic kidney disease with heart failure and stage 1 through stage 4 chronic kidney disease, or unspecified chronic kidney disease: Secondary | ICD-10-CM | POA: Diagnosis not present

## 2019-02-20 DIAGNOSIS — Z7982 Long term (current) use of aspirin: Secondary | ICD-10-CM | POA: Diagnosis not present

## 2019-02-20 DIAGNOSIS — J449 Chronic obstructive pulmonary disease, unspecified: Secondary | ICD-10-CM | POA: Diagnosis not present

## 2019-02-20 DIAGNOSIS — B964 Proteus (mirabilis) (morganii) as the cause of diseases classified elsewhere: Secondary | ICD-10-CM | POA: Diagnosis not present

## 2019-02-20 DIAGNOSIS — Z792 Long term (current) use of antibiotics: Secondary | ICD-10-CM | POA: Diagnosis not present

## 2019-02-20 DIAGNOSIS — N390 Urinary tract infection, site not specified: Secondary | ICD-10-CM | POA: Diagnosis not present

## 2019-02-20 LAB — CULTURE, BLOOD (ROUTINE X 2)
Culture: NO GROWTH
Culture: NO GROWTH
Special Requests: ADEQUATE

## 2019-02-23 DIAGNOSIS — Z792 Long term (current) use of antibiotics: Secondary | ICD-10-CM | POA: Diagnosis not present

## 2019-02-23 DIAGNOSIS — N390 Urinary tract infection, site not specified: Secondary | ICD-10-CM | POA: Diagnosis not present

## 2019-02-23 DIAGNOSIS — E1122 Type 2 diabetes mellitus with diabetic chronic kidney disease: Secondary | ICD-10-CM | POA: Diagnosis not present

## 2019-02-23 DIAGNOSIS — G5603 Carpal tunnel syndrome, bilateral upper limbs: Secondary | ICD-10-CM | POA: Diagnosis not present

## 2019-02-23 DIAGNOSIS — I130 Hypertensive heart and chronic kidney disease with heart failure and stage 1 through stage 4 chronic kidney disease, or unspecified chronic kidney disease: Secondary | ICD-10-CM | POA: Diagnosis not present

## 2019-02-23 DIAGNOSIS — Z7982 Long term (current) use of aspirin: Secondary | ICD-10-CM | POA: Diagnosis not present

## 2019-02-23 DIAGNOSIS — N130 Hydronephrosis with ureteropelvic junction obstruction: Secondary | ICD-10-CM | POA: Diagnosis not present

## 2019-02-23 DIAGNOSIS — Z9181 History of falling: Secondary | ICD-10-CM | POA: Diagnosis not present

## 2019-02-23 DIAGNOSIS — J189 Pneumonia, unspecified organism: Secondary | ICD-10-CM | POA: Diagnosis not present

## 2019-02-23 DIAGNOSIS — N183 Chronic kidney disease, stage 3 (moderate): Secondary | ICD-10-CM | POA: Diagnosis not present

## 2019-02-23 DIAGNOSIS — M6281 Muscle weakness (generalized): Secondary | ICD-10-CM | POA: Diagnosis not present

## 2019-02-23 DIAGNOSIS — B964 Proteus (mirabilis) (morganii) as the cause of diseases classified elsewhere: Secondary | ICD-10-CM | POA: Diagnosis not present

## 2019-02-23 DIAGNOSIS — Z85038 Personal history of other malignant neoplasm of large intestine: Secondary | ICD-10-CM | POA: Diagnosis not present

## 2019-02-23 DIAGNOSIS — Z87891 Personal history of nicotine dependence: Secondary | ICD-10-CM | POA: Diagnosis not present

## 2019-02-23 DIAGNOSIS — I509 Heart failure, unspecified: Secondary | ICD-10-CM | POA: Diagnosis not present

## 2019-02-23 DIAGNOSIS — M1990 Unspecified osteoarthritis, unspecified site: Secondary | ICD-10-CM | POA: Diagnosis not present

## 2019-02-23 DIAGNOSIS — I5033 Acute on chronic diastolic (congestive) heart failure: Secondary | ICD-10-CM | POA: Diagnosis not present

## 2019-02-24 DIAGNOSIS — M1990 Unspecified osteoarthritis, unspecified site: Secondary | ICD-10-CM | POA: Diagnosis not present

## 2019-02-24 DIAGNOSIS — M6281 Muscle weakness (generalized): Secondary | ICD-10-CM | POA: Diagnosis not present

## 2019-02-24 DIAGNOSIS — Z9181 History of falling: Secondary | ICD-10-CM | POA: Diagnosis not present

## 2019-02-24 DIAGNOSIS — I5033 Acute on chronic diastolic (congestive) heart failure: Secondary | ICD-10-CM | POA: Diagnosis not present

## 2019-02-24 DIAGNOSIS — N390 Urinary tract infection, site not specified: Secondary | ICD-10-CM | POA: Diagnosis not present

## 2019-02-24 DIAGNOSIS — N183 Chronic kidney disease, stage 3 (moderate): Secondary | ICD-10-CM | POA: Diagnosis not present

## 2019-02-24 DIAGNOSIS — I509 Heart failure, unspecified: Secondary | ICD-10-CM | POA: Diagnosis not present

## 2019-02-24 DIAGNOSIS — B964 Proteus (mirabilis) (morganii) as the cause of diseases classified elsewhere: Secondary | ICD-10-CM | POA: Diagnosis not present

## 2019-02-24 DIAGNOSIS — Z85038 Personal history of other malignant neoplasm of large intestine: Secondary | ICD-10-CM | POA: Diagnosis not present

## 2019-02-24 DIAGNOSIS — Z792 Long term (current) use of antibiotics: Secondary | ICD-10-CM | POA: Diagnosis not present

## 2019-02-24 DIAGNOSIS — E1122 Type 2 diabetes mellitus with diabetic chronic kidney disease: Secondary | ICD-10-CM | POA: Diagnosis not present

## 2019-02-24 DIAGNOSIS — N130 Hydronephrosis with ureteropelvic junction obstruction: Secondary | ICD-10-CM | POA: Diagnosis not present

## 2019-02-24 DIAGNOSIS — J189 Pneumonia, unspecified organism: Secondary | ICD-10-CM | POA: Diagnosis not present

## 2019-02-24 DIAGNOSIS — G5603 Carpal tunnel syndrome, bilateral upper limbs: Secondary | ICD-10-CM | POA: Diagnosis not present

## 2019-02-24 DIAGNOSIS — I130 Hypertensive heart and chronic kidney disease with heart failure and stage 1 through stage 4 chronic kidney disease, or unspecified chronic kidney disease: Secondary | ICD-10-CM | POA: Diagnosis not present

## 2019-02-24 DIAGNOSIS — Z87891 Personal history of nicotine dependence: Secondary | ICD-10-CM | POA: Diagnosis not present

## 2019-02-24 DIAGNOSIS — Z7982 Long term (current) use of aspirin: Secondary | ICD-10-CM | POA: Diagnosis not present

## 2019-02-25 DIAGNOSIS — J441 Chronic obstructive pulmonary disease with (acute) exacerbation: Secondary | ICD-10-CM | POA: Diagnosis not present

## 2019-02-25 DIAGNOSIS — I10 Essential (primary) hypertension: Secondary | ICD-10-CM | POA: Diagnosis not present

## 2019-02-26 DIAGNOSIS — I5033 Acute on chronic diastolic (congestive) heart failure: Secondary | ICD-10-CM | POA: Diagnosis not present

## 2019-02-26 DIAGNOSIS — Z87891 Personal history of nicotine dependence: Secondary | ICD-10-CM | POA: Diagnosis not present

## 2019-02-26 DIAGNOSIS — M1990 Unspecified osteoarthritis, unspecified site: Secondary | ICD-10-CM | POA: Diagnosis not present

## 2019-02-26 DIAGNOSIS — M6281 Muscle weakness (generalized): Secondary | ICD-10-CM | POA: Diagnosis not present

## 2019-02-26 DIAGNOSIS — J189 Pneumonia, unspecified organism: Secondary | ICD-10-CM | POA: Diagnosis not present

## 2019-02-26 DIAGNOSIS — B964 Proteus (mirabilis) (morganii) as the cause of diseases classified elsewhere: Secondary | ICD-10-CM | POA: Diagnosis not present

## 2019-02-26 DIAGNOSIS — N390 Urinary tract infection, site not specified: Secondary | ICD-10-CM | POA: Diagnosis not present

## 2019-02-26 DIAGNOSIS — Z792 Long term (current) use of antibiotics: Secondary | ICD-10-CM | POA: Diagnosis not present

## 2019-02-26 DIAGNOSIS — E1122 Type 2 diabetes mellitus with diabetic chronic kidney disease: Secondary | ICD-10-CM | POA: Diagnosis not present

## 2019-02-26 DIAGNOSIS — Z85038 Personal history of other malignant neoplasm of large intestine: Secondary | ICD-10-CM | POA: Diagnosis not present

## 2019-02-26 DIAGNOSIS — N183 Chronic kidney disease, stage 3 (moderate): Secondary | ICD-10-CM | POA: Diagnosis not present

## 2019-02-26 DIAGNOSIS — I130 Hypertensive heart and chronic kidney disease with heart failure and stage 1 through stage 4 chronic kidney disease, or unspecified chronic kidney disease: Secondary | ICD-10-CM | POA: Diagnosis not present

## 2019-02-26 DIAGNOSIS — G5603 Carpal tunnel syndrome, bilateral upper limbs: Secondary | ICD-10-CM | POA: Diagnosis not present

## 2019-02-26 DIAGNOSIS — N130 Hydronephrosis with ureteropelvic junction obstruction: Secondary | ICD-10-CM | POA: Diagnosis not present

## 2019-02-26 DIAGNOSIS — Z9181 History of falling: Secondary | ICD-10-CM | POA: Diagnosis not present

## 2019-02-26 DIAGNOSIS — I509 Heart failure, unspecified: Secondary | ICD-10-CM | POA: Diagnosis not present

## 2019-02-26 DIAGNOSIS — Z7982 Long term (current) use of aspirin: Secondary | ICD-10-CM | POA: Diagnosis not present

## 2019-02-27 ENCOUNTER — Encounter: Payer: Self-pay | Admitting: Cardiology

## 2019-02-27 ENCOUNTER — Other Ambulatory Visit: Payer: Self-pay

## 2019-02-27 ENCOUNTER — Ambulatory Visit (INDEPENDENT_AMBULATORY_CARE_PROVIDER_SITE_OTHER): Payer: Medicare Other | Admitting: Cardiology

## 2019-02-27 VITALS — Ht 64.0 in | Wt 187.0 lb

## 2019-02-27 DIAGNOSIS — I5032 Chronic diastolic (congestive) heart failure: Secondary | ICD-10-CM | POA: Insufficient documentation

## 2019-02-27 DIAGNOSIS — N130 Hydronephrosis with ureteropelvic junction obstruction: Secondary | ICD-10-CM | POA: Diagnosis not present

## 2019-02-27 DIAGNOSIS — Z9181 History of falling: Secondary | ICD-10-CM | POA: Diagnosis not present

## 2019-02-27 DIAGNOSIS — Z01818 Encounter for other preprocedural examination: Secondary | ICD-10-CM

## 2019-02-27 DIAGNOSIS — Z87891 Personal history of nicotine dependence: Secondary | ICD-10-CM | POA: Diagnosis not present

## 2019-02-27 DIAGNOSIS — Z792 Long term (current) use of antibiotics: Secondary | ICD-10-CM | POA: Diagnosis not present

## 2019-02-27 DIAGNOSIS — J189 Pneumonia, unspecified organism: Secondary | ICD-10-CM | POA: Diagnosis not present

## 2019-02-27 DIAGNOSIS — I5033 Acute on chronic diastolic (congestive) heart failure: Secondary | ICD-10-CM | POA: Diagnosis not present

## 2019-02-27 DIAGNOSIS — B964 Proteus (mirabilis) (morganii) as the cause of diseases classified elsewhere: Secondary | ICD-10-CM | POA: Diagnosis not present

## 2019-02-27 DIAGNOSIS — M1990 Unspecified osteoarthritis, unspecified site: Secondary | ICD-10-CM | POA: Diagnosis not present

## 2019-02-27 DIAGNOSIS — I509 Heart failure, unspecified: Secondary | ICD-10-CM | POA: Diagnosis not present

## 2019-02-27 DIAGNOSIS — N390 Urinary tract infection, site not specified: Secondary | ICD-10-CM | POA: Diagnosis not present

## 2019-02-27 DIAGNOSIS — N183 Chronic kidney disease, stage 3 (moderate): Secondary | ICD-10-CM | POA: Diagnosis not present

## 2019-02-27 DIAGNOSIS — G5603 Carpal tunnel syndrome, bilateral upper limbs: Secondary | ICD-10-CM | POA: Diagnosis not present

## 2019-02-27 DIAGNOSIS — Z7982 Long term (current) use of aspirin: Secondary | ICD-10-CM | POA: Diagnosis not present

## 2019-02-27 DIAGNOSIS — Z85038 Personal history of other malignant neoplasm of large intestine: Secondary | ICD-10-CM | POA: Diagnosis not present

## 2019-02-27 DIAGNOSIS — M6281 Muscle weakness (generalized): Secondary | ICD-10-CM | POA: Diagnosis not present

## 2019-02-27 DIAGNOSIS — E1122 Type 2 diabetes mellitus with diabetic chronic kidney disease: Secondary | ICD-10-CM | POA: Diagnosis not present

## 2019-02-27 DIAGNOSIS — I130 Hypertensive heart and chronic kidney disease with heart failure and stage 1 through stage 4 chronic kidney disease, or unspecified chronic kidney disease: Secondary | ICD-10-CM | POA: Diagnosis not present

## 2019-02-27 NOTE — Progress Notes (Signed)
Virtual Visit via Video Note   Subjective:   [name], male    DOB: 1923-11-15, 85 y.o.   MRN: [id number]   I connected with the patient on 02/27/19 by a video enabled telemedicine application and verified that I am speaking with the correct person using two identifiers.     I discussed the limitations of evaluation and management by telemedicine and the availability of in person appointments. The patient expressed understanding and agreed to proceed.   This visit type was conducted due to national recommendations for restrictions regarding the COVID-19 Pandemic (e.g. social distancing).  This format is felt to be most appropriate for this patient at this time.  All issues noted in this document were discussed and addressed.  No physical exam was performed (except for noted visual exam findings with Tele health visits).  The patient has consented to conduct a Tele health visit and understands insurance will be billed.     Chief complaint:  Pre-op cardiac evaluation   HPI  85 y/o [place] male with hypertension, CKD3, h/o prostate and colon cancer, h/o diastolic heart failure, referred for pre-op cardiac evaluation prior to bladder surgery.   Patient was recently admitted to [facility] in 01/2019 with hematuria, CT of the abdomen and pelvis showed a large multilobulated mass within the urinary bladder, concerning for bladder carcinoma potentially extension of enlarged prostate gland/prostate carcinoma.Patient was treated with antibiotics for UTI, givne lasix for vascular congestion, with improvement on discharge. He ambulated without desaturation. He is now following up with [facility], Dr. Nicolette Bang, and likely going to undergo transurethral resection of bladder tumor.   To my knowledge, patient is not on Xarelto. He is on Entresto, although last known EF was normal. I am unsure if his EF was low in the past.   He is currently living with either of his  two daughters. He reportedly moves from one daughter's house to other daughter's house 3-4 times a week. He ambulates inside the house using a walker. He reports dyspnea on exertion. He also reports leg edema.   Video discussion with the patient is limited over the phone due to his visual and hearing capacity. Patient's daughter is unaware what surgery the patient needs and what for and she asks me if he should undergo the surgery.    Past Medical History:  Diagnosis Date  . Adenomatous polyps 09/09/2008  . Arthritis   . Carpal tunnel syndrome, bilateral 11/21/2016  . Colon cancer ([place]) dx'd 2008   surg only  . Diabetes mellitus without complication ([place])   . Falls frequently   . Hypertension   . Prostate cancer ([place]) dx'd 1993   surg only     Past Surgical History:  Procedure Laterality Date  . CATARACT EXTRACTION W/PHACO Left 06/10/2013   Procedure: CATARACT EXTRACTION PHACO AND INTRAOCULAR LENS PLACEMENT (IOC);  Surgeon: Adonis Brook, MD;  Location: [facility];  Service: Ophthalmology;  Laterality: Left;  . COLONOSCOPY    . ESOPHAGOGASTRODUODENOSCOPY    . EYE SURGERY     cataract surgery, right eye  . laparoscopic assisted right hemicolectomy  09/21/07     Social History   Socioeconomic History  . Marital status: Widowed    Spouse name: Not on file  . Number of children: 3  . Years of education: Not on file  . Highest education level: Not on file  Occupational History  . Occupation: retired    Fish farm manager: RETIRED  Social Needs  . Financial  resource strain: Not on file  . Food insecurity:    Worry: Not on file    Inability: Not on file  . Transportation needs:    Medical: Not on file    Non-medical: Not on file  Tobacco Use  . Smoking status: Former Smoker    Years: 1.00    Types: Cigarettes    Last attempt to quit: 06/10/1983    Years since quitting: 35.7  . Smokeless tobacco: Never Used  Substance and Sexual Activity  . Alcohol use: Yes    Comment: occasional  wine  . Drug use: No  . Sexual activity: Not on file  Lifestyle  . Physical activity:    Days per week: Not on file    Minutes per session: Not on file  . Stress: Not on file  Relationships  . Social connections:    Talks on phone: Not on file    Gets together: Not on file    Attends religious service: Not on file    Active member of club or organization: Not on file    Attends meetings of clubs or organizations: Not on file    Relationship status: Not on file  . Intimate partner violence:    Fear of current or ex partner: Not on file    Emotionally abused: Not on file    Physically abused: Not on file    Forced sexual activity: Not on file  Other Topics Concern  . Not on file  Social History Narrative  . Not on file     Family History  Problem Relation Age of Onset  . Diabetes Other      Current Outpatient Medications on File Prior to Visit  Medication Sig Dispense Refill  . amLODipine (NORVASC) 10 MG tablet Take 10 mg by mouth daily.    Marland Kitchen aspirin EC 81 MG tablet Take 1 tablet (81 mg total) by mouth daily. 60 tablet 0  . citalopram (CELEXA) 10 MG tablet Take 10 mg by mouth daily.    Marland Kitchen ENTRESTO 24-26 MG Take 1 tablet by mouth 2 (two) times daily.    . furosemide (LASIX) 40 MG tablet Take 1 tablet (40 mg total) by mouth daily for 30 days. (Patient taking differently: Take 40 mg by mouth daily as needed for fluid or edema. ) 30 tablet 0  . ipratropium-albuterol (DUONEB) 0.5-2.5 (3) MG/3ML SOLN Take 3 mLs by nebulization every 6 (six) hours as needed. 360 mL 0  . oxybutynin (DITROPAN) 5 MG tablet Take 0.5 tablets (2.5 mg total) by mouth 3 (three) times daily. 45 tablet 0  . polyethylene glycol (MIRALAX / GLYCOLAX) 17 g packet Take 17 g by mouth daily as needed for mild constipation.    . tamsulosin (FLOMAX) 0.4 MG CAPS capsule Take 1 capsule (0.4 mg total) by mouth daily. 30 capsule 0   No current facility-administered medications on file prior to visit.      Cardiovascular studies:  EKG 02/13/2019: Sinus rhythm 52 bpm. LBBB  Echocardiogram 12/03/2017: - Left ventricle: The cavity size was normal. There was moderate   focal basal hypertrophy of the septum. Systolic function was   normal. The estimated ejection fraction was in the range of 60%   to 65%. Wall motion was normal; there were no regional wall   motion abnormalities. Doppler parameters are consistent with   abnormal left ventricular relaxation (grade 1 diastolic   dysfunction). - Aortic valve: Trileaflet; mildly thickened, mildly calcified   leaflets. - Mitral valve:  There was mild regurgitation. - Left atrium: The atrium was mildly dilated. - Right atrium: The atrium was mildly dilated. - Tricuspid valve: There was mild regurgitation. - Pulmonary arteries: Systolic pressure was mildly increased. PA   peak pressure: 45 mm Hg (S).  Impressions:  - Compared to the prior study, there has been no significant   interval change.   Recent labs: Results for VIOLET, CART (MRN [id number]) as of 02/27/2019 08:53  Ref. Range 02/17/2019 04:18  COMPREHENSIVE METABOLIC PANEL Unknown Rpt (A)  Sodium Latest Ref Range: 135 - 145 mmol/L 139  Potassium Latest Ref Range: 3.5 - 5.1 mmol/L 3.8  Chloride Latest Ref Range: 98 - 111 mmol/L 109  CO2 Latest Ref Range: 22 - 32 mmol/L 22  Glucose Latest Ref Range: 70 - 99 mg/dL 82  BUN Latest Ref Range: 8 - 23 mg/dL 25 (H)  Creatinine Latest Ref Range: 0.61 - 1.24 mg/dL 2.06 (H)  Calcium Latest Ref Range: 8.9 - 10.3 mg/dL 8.9  Anion gap Latest Ref Range: 5 - 15  8  Phosphorus Latest Ref Range: 2.5 - 4.6 mg/dL 3.4  Magnesium Latest Ref Range: 1.7 - 2.4 mg/dL 2.1  Alkaline Phosphatase Latest Ref Range: 38 - 126 U/L 77  Albumin Latest Ref Range: 3.5 - 5.0 g/dL 3.0 (L)  AST Latest Ref Range: 15 - 41 U/L 16  ALT Latest Ref Range: 0 - 44 U/L 12  Total Protein Latest Ref Range: 6.5 - 8.1 g/dL 6.4 (L)  Total Bilirubin Latest Ref Range: 0.3 - 1.2  mg/dL 0.3  GFR, Est Non African American Latest Ref Range: >60 mL/min 27 (L)  GFR, Est African American Latest Ref Range: >60 mL/min 31 (L)  Iron Latest Ref Range: 45 - 182 ug/dL 31 (L)  UIBC Latest Units: ug/dL 198  TIBC Latest Ref Range: 250 - 450 ug/dL 229 (L)  Saturation Ratios Latest Ref Range: 17.9 - 39.5 % 14 (L)  Ferritin Latest Ref Range: 24 - 336 ng/mL 61  Folate Latest Ref Range: >5.9 ng/mL 7.6  Vitamin B12 Latest Ref Range: 180 - 914 pg/mL 908    Results for KURON, DOCKEN (MRN [id number]) as of 02/27/2019 08:53  Ref. Range 02/17/2019 04:18  WBC Latest Ref Range: 4.0 - 10.5 K/uL 5.0  RBC Latest Ref Range: 4.22 - 5.81 MIL/uL 3.60 (L)  Hemoglobin Latest Ref Range: 13.0 - 17.0 g/dL 9.6 (L)  HCT Latest Ref Range: 39.0 - 52.0 % 30.4 (L)  MCV Latest Ref Range: 80.0 - 100.0 fL 84.4  MCH Latest Ref Range: 26.0 - 34.0 pg 26.7  MCHC Latest Ref Range: 30.0 - 36.0 g/dL 31.6  RDW Latest Ref Range: 11.5 - 15.5 % 15.1  Platelets Latest Ref Range: 150 - 400 K/uL 202  nRBC Latest Ref Range: 0.0 - 0.2 % 0.0    Review of Systems  Constitution: Negative for decreased appetite, malaise/fatigue, weight gain and weight loss.  HENT: Negative for congestion.   Eyes: Negative for visual disturbance.  Cardiovascular: Positive for dyspnea on exertion and leg swelling. Negative for chest pain, palpitations and syncope.  Respiratory: Positive for shortness of breath.   Endocrine: Negative for cold intolerance.  Hematologic/Lymphatic: Does not bruise/bleed easily.  Skin: Negative for itching and rash.  Musculoskeletal: Negative for myalgias.  Gastrointestinal: Negative for abdominal pain, nausea and vomiting.  Genitourinary: Negative for dysuria.  Neurological: Negative for dizziness and weakness.  Psychiatric/Behavioral: The patient is not nervous/anxious.   All other systems reviewed and are negative.  No vitals available today.  Vitals notes at recent PCP visit  BP 133/52 mmHg.  Pulse 52 bpm. Temp 97.6 F. RR 20/min. Ht 6 in. Wt 187.2 lb. BMI 31.2  Observation/findings during video visit   Objective:    Physical Exam  Constitutional: He is oriented to person, place, and time. He appears well-developed and well-nourished. No distress.  Pulmonary/Chest: Effort normal.  Musculoskeletal:        General: Edema present.  Neurological: He is alert and oriented to person, place, and time.  Psychiatric: He has a normal mood and affect.  Nursing note and vitals reviewed.         Assessment & Recommendations:   85 y/o [place] American male with hypertension, CKD3, h/o prostate and colon cancer, h/o diastolic heart failure, referred for pre-op cardiac evaluation prior to bladder surgery.   Pre-op evaluation: It appears to me that the patient and his daughter are unclear about the surgical plans. His daughter asked me if he should undergo the surgery. I explained to the patients daughter that they should discuss with Dr. Alyson Ingles regarding the surgical plans. My goal is to risk stratify him from cardiac standpoint. I recommend echocardiogram next week, and follow up visit the same day. Continue lasix for now. I do not see indication for Entresto, unless his EF had been low in the past. Consider stopping Entresto, especially given his elevated Cr.   Nigel Mormon, MD [facility] Pager: [phone] Office: [phone] If no answer Cell [phone]

## 2019-03-03 ENCOUNTER — Ambulatory Visit: Payer: Medicare Other

## 2019-03-03 ENCOUNTER — Other Ambulatory Visit: Payer: Medicare Other

## 2019-03-03 ENCOUNTER — Ambulatory Visit (INDEPENDENT_AMBULATORY_CARE_PROVIDER_SITE_OTHER): Payer: Medicare Other | Admitting: Cardiology

## 2019-03-03 ENCOUNTER — Other Ambulatory Visit: Payer: Self-pay

## 2019-03-03 ENCOUNTER — Encounter: Payer: Self-pay | Admitting: Cardiology

## 2019-03-03 VITALS — BP 109/64 | HR 49 | Ht 64.0 in | Wt 186.0 lb

## 2019-03-03 DIAGNOSIS — I517 Cardiomegaly: Secondary | ICD-10-CM

## 2019-03-03 DIAGNOSIS — I5032 Chronic diastolic (congestive) heart failure: Secondary | ICD-10-CM

## 2019-03-03 DIAGNOSIS — Z01818 Encounter for other preprocedural examination: Secondary | ICD-10-CM

## 2019-03-03 HISTORY — DX: Cardiomegaly: I51.7

## 2019-03-03 NOTE — Progress Notes (Signed)
Follow up visit  Subjective:   [name], male    DOB: Jul 17, 1924, 85 y.o.   MRN: [id number]   Chief Complaint  Patient presents with  . Pre-op Exam    HPI  85 y/o [place] American male with hypertension, CKD3, h/o prostate and colon cancer, h/o diastolic heart failure, referred for pre-op cardiac evaluation prior to TURN prostatectomy.  Patient is here today with his daughter [name].  Patient is able to walk at home using walker.  He does not require oxygen at home.  His leg edema has improved.  Echocardiogram was performed in the office today, details below.  [name] is unclear about the plans for upcoming surgery.   Past Medical History:  Diagnosis Date  . Adenomatous polyps 09/09/2008  . Arthritis   . Carpal tunnel syndrome, bilateral 11/21/2016  . Colon cancer ([place]) dx'd 2008   surg only  . Diabetes mellitus without complication ([place])   . Falls frequently   . Hearing problem   . Hypertension   . Lipid disorder   . Prostate cancer ([place]) dx'd 1993   surg only     Past Surgical History:  Procedure Laterality Date  . CATARACT EXTRACTION W/PHACO Left 06/10/2013   Procedure: CATARACT EXTRACTION PHACO AND INTRAOCULAR LENS PLACEMENT (IOC);  Surgeon: Adonis Brook, MD;  Location: [place] [facility];  Service: Ophthalmology;  Laterality: Left;  . COLONOSCOPY    . ESOPHAGOGASTRODUODENOSCOPY    . EYE SURGERY     cataract surgery, right eye  . laparoscopic assisted right hemicolectomy  09/21/07     Social History   Socioeconomic History  . Marital status: Widowed    Spouse name: Not on file  . Number of children: 3  . Years of education: Not on file  . Highest education level: Not on file  Occupational History  . Occupation: retired    Fish farm manager: RETIRED  Social Needs  . Financial resource strain: Not on file  . Food insecurity:    Worry: Not on file    Inability: Not on file  . Transportation needs:    Medical: Not on file    Non-medical: Not on file  Tobacco Use  .  Smoking status: Former Smoker    Years: 1.00    Types: Cigarettes    Last attempt to quit: 06/10/1983    Years since quitting: 35.7  . Smokeless tobacco: Never Used  Substance and Sexual Activity  . Alcohol use: Not Currently  . Drug use: No  . Sexual activity: Not on file  Lifestyle  . Physical activity:    Days per week: Not on file    Minutes per session: Not on file  . Stress: Not on file  Relationships  . Social connections:    Talks on phone: Not on file    Gets together: Not on file    Attends religious service: Not on file    Active member of club or organization: Not on file    Attends meetings of clubs or organizations: Not on file    Relationship status: Not on file  . Intimate partner violence:    Fear of current or ex partner: Not on file    Emotionally abused: Not on file    Physically abused: Not on file    Forced sexual activity: Not on file  Other Topics Concern  . Not on file  Social History Narrative  . Not on file     Family History  Problem Relation Age of Onset  .  Diabetes Other   . Hypertension Mother   . Stroke Mother      Current Outpatient Medications on File Prior to Visit  Medication Sig Dispense Refill  . amLODipine (NORVASC) 10 MG tablet Take 10 mg by mouth daily.    Marland Kitchen aspirin EC 81 MG tablet Take 81 mg by mouth daily.    . citalopram (CELEXA) 10 MG tablet Take 10 mg by mouth daily.    Marland Kitchen ENTRESTO 24-26 MG Take 1 tablet by mouth 2 (two) times daily.    . furosemide (LASIX) 40 MG tablet Take 40 mg by mouth daily.    Marland Kitchen ipratropium-albuterol (DUONEB) 0.5-2.5 (3) MG/3ML SOLN Take 3 mLs by nebulization every 6 (six) hours as needed. 360 mL 0  . oxybutynin (DITROPAN) 5 MG tablet Take 2.5 mg by mouth 3 (three) times daily.    . polyethylene glycol (MIRALAX / GLYCOLAX) 17 g packet Take 17 g by mouth daily as needed for mild constipation.    . tamsulosin (FLOMAX) 0.4 MG CAPS capsule Take 1 capsule (0.4 mg total) by mouth daily. 30 capsule 0  .  Blood Glucose Monitoring Suppl (ONE TOUCH ULTRA 2) w/Device KIT USE TO CHECK GLUCOSE AS DIRECTED ONCE DAILY    . ONE TOUCH ULTRA TEST test strip USE 1 STRIP TO CHECK GLUCOSE TWICE DAILY     No current facility-administered medications on file prior to visit.     Cardiovascular studies:  EKG 02/13/2019: Sinus rhythm 52 bpm. LBBB  Echocardiogram 03/03/2019: Left ventricle cavity is normal in size. Moderate concentric hypertrophy of the left ventricle. Normal global wall motion. Visual EF is 55-60%. Doppler evidence of grade I (impaired) diastolic dysfunction, normal LAP. Calculated EF 55%. Left atrial cavity is moderately dilated. Trileaflet aortic valve mild aortic valve leaflet calcification. Aortic valve mean gradient of 7 mmHg, Vmax of 1.9  m/s. Calculated aortic valve area by continuity equation is 1.8 cm. Trace regurgitation. Mild (Grade I) mitral regurgitation. Mild tricuspid valve regurgitation. Estimated pulmonary artery systolic pressure 22 mmHg. Pulmonary hypertension seen on echocardiogram in 10/2017 not well appreciated on this study. Otherwise, no significant change noted.   Recent labs: Results for HERACLIO, SEIDMAN (MRN [id number]) as of 02/27/2019 08:53  Ref. Range 02/17/2019 04:18  COMPREHENSIVE METABOLIC PANEL Unknown Rpt (A)  Sodium Latest Ref Range: 135 - 145 mmol/L 139  Potassium Latest Ref Range: 3.5 - 5.1 mmol/L 3.8  Chloride Latest Ref Range: 98 - 111 mmol/L 109  CO2 Latest Ref Range: 22 - 32 mmol/L 22  Glucose Latest Ref Range: 70 - 99 mg/dL 82  BUN Latest Ref Range: 8 - 23 mg/dL 25 (H)  Creatinine Latest Ref Range: 0.61 - 1.24 mg/dL 2.06 (H)  Calcium Latest Ref Range: 8.9 - 10.3 mg/dL 8.9  Anion gap Latest Ref Range: 5 - 15  8  Phosphorus Latest Ref Range: 2.5 - 4.6 mg/dL 3.4  Magnesium Latest Ref Range: 1.7 - 2.4 mg/dL 2.1  Alkaline Phosphatase Latest Ref Range: 38 - 126 U/L 77  Albumin Latest Ref Range: 3.5 - 5.0 g/dL 3.0 (L)  AST Latest Ref Range: 15 - 41  U/L 16  ALT Latest Ref Range: 0 - 44 U/L 12  Total Protein Latest Ref Range: 6.5 - 8.1 g/dL 6.4 (L)  Total Bilirubin Latest Ref Range: 0.3 - 1.2 mg/dL 0.3  GFR, Est Non African American Latest Ref Range: >60 mL/min 27 (L)  GFR, Est African American Latest Ref Range: >60 mL/min 31 (L)  Iron Latest Ref Range:  45 - 182 ug/dL 31 (L)  UIBC Latest Units: ug/dL 198  TIBC Latest Ref Range: 250 - 450 ug/dL 229 (L)  Saturation Ratios Latest Ref Range: 17.9 - 39.5 % 14 (L)  Ferritin Latest Ref Range: 24 - 336 ng/mL 61  Folate Latest Ref Range: >5.9 ng/mL 7.6  Vitamin B12 Latest Ref Range: 180 - 914 pg/mL 908    Results for KAMONTE, MCMICHEN (MRN [id number]) as of 02/27/2019 08:53  Ref. Range 02/17/2019 04:18  WBC Latest Ref Range: 4.0 - 10.5 K/uL 5.0  RBC Latest Ref Range: 4.22 - 5.81 MIL/uL 3.60 (L)  Hemoglobin Latest Ref Range: 13.0 - 17.0 g/dL 9.6 (L)  HCT Latest Ref Range: 39.0 - 52.0 % 30.4 (L)  MCV Latest Ref Range: 80.0 - 100.0 fL 84.4  MCH Latest Ref Range: 26.0 - 34.0 pg 26.7  MCHC Latest Ref Range: 30.0 - 36.0 g/dL 31.6  RDW Latest Ref Range: 11.5 - 15.5 % 15.1  Platelets Latest Ref Range: 150 - 400 K/uL 202  nRBC Latest Ref Range: 0.0 - 0.2 % 0.0     Review of Systems  Constitution: Negative for decreased appetite, malaise/fatigue, weight gain and weight loss.  HENT: Negative for congestion.   Eyes: Negative for visual disturbance.  Cardiovascular: Positive for dyspnea on exertion (Improved). Negative for chest pain, palpitations and syncope.  Respiratory: Negative for cough.   Endocrine: Negative for cold intolerance.  Hematologic/Lymphatic: Does not bruise/bleed easily.  Skin: Negative for itching and rash.  Musculoskeletal: Negative for myalgias.  Gastrointestinal: Negative for abdominal pain, nausea and vomiting.  Genitourinary: Negative for dysuria.  Neurological: Negative for dizziness and weakness.  Psychiatric/Behavioral: The patient is not nervous/anxious.   All  other systems reviewed and are negative.        Vitals:   03/03/19 1307  BP: 109/64  Pulse: (!) 49  SpO2: 96%    Objective:   Physical Exam  Constitutional: He is oriented to person, place, and time. He appears well-developed and well-nourished. No distress.  HENT:  Head: Normocephalic and atraumatic.  Eyes: Pupils are equal, round, and reactive to light. Conjunctivae are normal.  Neck: No JVD present.  Cardiovascular: Normal rate and regular rhythm.  Murmur heard.  Harsh midsystolic murmur is present with a grade of 2/6 at the upper right sternal border radiating to the neck. Pulmonary/Chest: Effort normal and breath sounds normal. He has no wheezes. He has no rales.  Abdominal: Soft. Bowel sounds are normal. There is no rebound.  Musculoskeletal:        General: No edema.  Lymphadenopathy:    He has no cervical adenopathy.  Neurological: He is alert and oriented to person, place, and time. No cranial nerve deficit.  Skin: Skin is warm and dry.  Psychiatric: He has a normal mood and affect.  Nursing note and vitals reviewed.         Assessment & Recommendations:   85 y/o [place] American male with hypertension, CKD3, h/o prostate and colon cancer, h/o diastolic heart failure, referred for pre-op cardiac evaluation prior to bladder surgery.   Pre-op evaluation: Echocardiogram today shows mild diastolic dysfunction, mild aortic stenosis, mild mitral and tricuspid regurgitation. LVEF is preserved. Overall, no alarming findings on resting echocardiogram. Clinically, he appears euvolumic.   His perioperative risk for noncardiac surgery remains moderate by virtue of his age.  He does not have any chest pain symptoms at this time.  I do not think stress test will change his perioperative risk.  His  creatinine is elevated at baseline, thus coronary angiography will only increase risk of contrast induced nephropathy. Moreover, it appears to me that patient and his daughter  [name] are unclear about plans for any surgery. They are worried about him tolerating the surgery. I have encouraged them to speak with Dr. Alyson Ingles.  Patient is currently on Entresto 24-26 mg bid. His LVEF is normal. I am not aware if he had reduced LVEF in the past. Strongly recommend considering stopping Entresto in light of his underlying CKD, if there is no clear indication.  Nigel Mormon, MD [facility] Pager: [phone] Office: [phone] If no answer Cell [phone]

## 2019-03-04 DIAGNOSIS — Z85038 Personal history of other malignant neoplasm of large intestine: Secondary | ICD-10-CM | POA: Diagnosis not present

## 2019-03-04 DIAGNOSIS — N130 Hydronephrosis with ureteropelvic junction obstruction: Secondary | ICD-10-CM | POA: Diagnosis not present

## 2019-03-04 DIAGNOSIS — M1990 Unspecified osteoarthritis, unspecified site: Secondary | ICD-10-CM | POA: Diagnosis not present

## 2019-03-04 DIAGNOSIS — G5603 Carpal tunnel syndrome, bilateral upper limbs: Secondary | ICD-10-CM | POA: Diagnosis not present

## 2019-03-04 DIAGNOSIS — I509 Heart failure, unspecified: Secondary | ICD-10-CM | POA: Diagnosis not present

## 2019-03-04 DIAGNOSIS — M6281 Muscle weakness (generalized): Secondary | ICD-10-CM | POA: Diagnosis not present

## 2019-03-04 DIAGNOSIS — I5033 Acute on chronic diastolic (congestive) heart failure: Secondary | ICD-10-CM | POA: Diagnosis not present

## 2019-03-04 DIAGNOSIS — Z87891 Personal history of nicotine dependence: Secondary | ICD-10-CM | POA: Diagnosis not present

## 2019-03-04 DIAGNOSIS — N390 Urinary tract infection, site not specified: Secondary | ICD-10-CM | POA: Diagnosis not present

## 2019-03-04 DIAGNOSIS — Z792 Long term (current) use of antibiotics: Secondary | ICD-10-CM | POA: Diagnosis not present

## 2019-03-04 DIAGNOSIS — E1122 Type 2 diabetes mellitus with diabetic chronic kidney disease: Secondary | ICD-10-CM | POA: Diagnosis not present

## 2019-03-04 DIAGNOSIS — N183 Chronic kidney disease, stage 3 (moderate): Secondary | ICD-10-CM | POA: Diagnosis not present

## 2019-03-04 DIAGNOSIS — Z7982 Long term (current) use of aspirin: Secondary | ICD-10-CM | POA: Diagnosis not present

## 2019-03-04 DIAGNOSIS — B964 Proteus (mirabilis) (morganii) as the cause of diseases classified elsewhere: Secondary | ICD-10-CM | POA: Diagnosis not present

## 2019-03-04 DIAGNOSIS — Z9181 History of falling: Secondary | ICD-10-CM | POA: Diagnosis not present

## 2019-03-04 DIAGNOSIS — J189 Pneumonia, unspecified organism: Secondary | ICD-10-CM | POA: Diagnosis not present

## 2019-03-04 DIAGNOSIS — I130 Hypertensive heart and chronic kidney disease with heart failure and stage 1 through stage 4 chronic kidney disease, or unspecified chronic kidney disease: Secondary | ICD-10-CM | POA: Diagnosis not present

## 2019-03-06 ENCOUNTER — Other Ambulatory Visit: Payer: Self-pay | Admitting: Urology

## 2019-03-06 DIAGNOSIS — M1990 Unspecified osteoarthritis, unspecified site: Secondary | ICD-10-CM | POA: Diagnosis not present

## 2019-03-06 DIAGNOSIS — I130 Hypertensive heart and chronic kidney disease with heart failure and stage 1 through stage 4 chronic kidney disease, or unspecified chronic kidney disease: Secondary | ICD-10-CM | POA: Diagnosis not present

## 2019-03-06 DIAGNOSIS — Z9181 History of falling: Secondary | ICD-10-CM | POA: Diagnosis not present

## 2019-03-06 DIAGNOSIS — Z85038 Personal history of other malignant neoplasm of large intestine: Secondary | ICD-10-CM | POA: Diagnosis not present

## 2019-03-06 DIAGNOSIS — M6281 Muscle weakness (generalized): Secondary | ICD-10-CM | POA: Diagnosis not present

## 2019-03-06 DIAGNOSIS — Z87891 Personal history of nicotine dependence: Secondary | ICD-10-CM | POA: Diagnosis not present

## 2019-03-06 DIAGNOSIS — I509 Heart failure, unspecified: Secondary | ICD-10-CM | POA: Diagnosis not present

## 2019-03-06 DIAGNOSIS — N390 Urinary tract infection, site not specified: Secondary | ICD-10-CM | POA: Diagnosis not present

## 2019-03-06 DIAGNOSIS — G5603 Carpal tunnel syndrome, bilateral upper limbs: Secondary | ICD-10-CM | POA: Diagnosis not present

## 2019-03-06 DIAGNOSIS — I5033 Acute on chronic diastolic (congestive) heart failure: Secondary | ICD-10-CM | POA: Diagnosis not present

## 2019-03-06 DIAGNOSIS — N130 Hydronephrosis with ureteropelvic junction obstruction: Secondary | ICD-10-CM | POA: Diagnosis not present

## 2019-03-06 DIAGNOSIS — Z7982 Long term (current) use of aspirin: Secondary | ICD-10-CM | POA: Diagnosis not present

## 2019-03-06 DIAGNOSIS — J189 Pneumonia, unspecified organism: Secondary | ICD-10-CM | POA: Diagnosis not present

## 2019-03-06 DIAGNOSIS — E1122 Type 2 diabetes mellitus with diabetic chronic kidney disease: Secondary | ICD-10-CM | POA: Diagnosis not present

## 2019-03-06 DIAGNOSIS — N183 Chronic kidney disease, stage 3 (moderate): Secondary | ICD-10-CM | POA: Diagnosis not present

## 2019-03-06 DIAGNOSIS — B964 Proteus (mirabilis) (morganii) as the cause of diseases classified elsewhere: Secondary | ICD-10-CM | POA: Diagnosis not present

## 2019-03-06 DIAGNOSIS — Z792 Long term (current) use of antibiotics: Secondary | ICD-10-CM | POA: Diagnosis not present

## 2019-03-09 DIAGNOSIS — Z7982 Long term (current) use of aspirin: Secondary | ICD-10-CM | POA: Diagnosis not present

## 2019-03-09 DIAGNOSIS — I509 Heart failure, unspecified: Secondary | ICD-10-CM | POA: Diagnosis not present

## 2019-03-09 DIAGNOSIS — N390 Urinary tract infection, site not specified: Secondary | ICD-10-CM | POA: Diagnosis not present

## 2019-03-09 DIAGNOSIS — M1990 Unspecified osteoarthritis, unspecified site: Secondary | ICD-10-CM | POA: Diagnosis not present

## 2019-03-09 DIAGNOSIS — Z87891 Personal history of nicotine dependence: Secondary | ICD-10-CM | POA: Diagnosis not present

## 2019-03-09 DIAGNOSIS — Z792 Long term (current) use of antibiotics: Secondary | ICD-10-CM | POA: Diagnosis not present

## 2019-03-09 DIAGNOSIS — Z85038 Personal history of other malignant neoplasm of large intestine: Secondary | ICD-10-CM | POA: Diagnosis not present

## 2019-03-09 DIAGNOSIS — N130 Hydronephrosis with ureteropelvic junction obstruction: Secondary | ICD-10-CM | POA: Diagnosis not present

## 2019-03-09 DIAGNOSIS — N183 Chronic kidney disease, stage 3 (moderate): Secondary | ICD-10-CM | POA: Diagnosis not present

## 2019-03-09 DIAGNOSIS — J189 Pneumonia, unspecified organism: Secondary | ICD-10-CM | POA: Diagnosis not present

## 2019-03-09 DIAGNOSIS — E1122 Type 2 diabetes mellitus with diabetic chronic kidney disease: Secondary | ICD-10-CM | POA: Diagnosis not present

## 2019-03-09 DIAGNOSIS — I5033 Acute on chronic diastolic (congestive) heart failure: Secondary | ICD-10-CM | POA: Diagnosis not present

## 2019-03-09 DIAGNOSIS — M6281 Muscle weakness (generalized): Secondary | ICD-10-CM | POA: Diagnosis not present

## 2019-03-09 DIAGNOSIS — Z9181 History of falling: Secondary | ICD-10-CM | POA: Diagnosis not present

## 2019-03-09 DIAGNOSIS — G5603 Carpal tunnel syndrome, bilateral upper limbs: Secondary | ICD-10-CM | POA: Diagnosis not present

## 2019-03-09 DIAGNOSIS — B964 Proteus (mirabilis) (morganii) as the cause of diseases classified elsewhere: Secondary | ICD-10-CM | POA: Diagnosis not present

## 2019-03-09 DIAGNOSIS — I130 Hypertensive heart and chronic kidney disease with heart failure and stage 1 through stage 4 chronic kidney disease, or unspecified chronic kidney disease: Secondary | ICD-10-CM | POA: Diagnosis not present

## 2019-03-10 DIAGNOSIS — M6281 Muscle weakness (generalized): Secondary | ICD-10-CM | POA: Diagnosis not present

## 2019-03-10 DIAGNOSIS — N130 Hydronephrosis with ureteropelvic junction obstruction: Secondary | ICD-10-CM | POA: Diagnosis not present

## 2019-03-10 DIAGNOSIS — Z9181 History of falling: Secondary | ICD-10-CM | POA: Diagnosis not present

## 2019-03-10 DIAGNOSIS — J189 Pneumonia, unspecified organism: Secondary | ICD-10-CM | POA: Diagnosis not present

## 2019-03-10 DIAGNOSIS — B964 Proteus (mirabilis) (morganii) as the cause of diseases classified elsewhere: Secondary | ICD-10-CM | POA: Diagnosis not present

## 2019-03-10 DIAGNOSIS — N183 Chronic kidney disease, stage 3 (moderate): Secondary | ICD-10-CM | POA: Diagnosis not present

## 2019-03-10 DIAGNOSIS — I130 Hypertensive heart and chronic kidney disease with heart failure and stage 1 through stage 4 chronic kidney disease, or unspecified chronic kidney disease: Secondary | ICD-10-CM | POA: Diagnosis not present

## 2019-03-10 DIAGNOSIS — M1990 Unspecified osteoarthritis, unspecified site: Secondary | ICD-10-CM | POA: Diagnosis not present

## 2019-03-10 DIAGNOSIS — Z792 Long term (current) use of antibiotics: Secondary | ICD-10-CM | POA: Diagnosis not present

## 2019-03-10 DIAGNOSIS — I5033 Acute on chronic diastolic (congestive) heart failure: Secondary | ICD-10-CM | POA: Diagnosis not present

## 2019-03-10 DIAGNOSIS — Z87891 Personal history of nicotine dependence: Secondary | ICD-10-CM | POA: Diagnosis not present

## 2019-03-10 DIAGNOSIS — Z7982 Long term (current) use of aspirin: Secondary | ICD-10-CM | POA: Diagnosis not present

## 2019-03-10 DIAGNOSIS — Z85038 Personal history of other malignant neoplasm of large intestine: Secondary | ICD-10-CM | POA: Diagnosis not present

## 2019-03-10 DIAGNOSIS — E1122 Type 2 diabetes mellitus with diabetic chronic kidney disease: Secondary | ICD-10-CM | POA: Diagnosis not present

## 2019-03-10 DIAGNOSIS — I509 Heart failure, unspecified: Secondary | ICD-10-CM | POA: Diagnosis not present

## 2019-03-10 DIAGNOSIS — G5603 Carpal tunnel syndrome, bilateral upper limbs: Secondary | ICD-10-CM | POA: Diagnosis not present

## 2019-03-10 DIAGNOSIS — N390 Urinary tract infection, site not specified: Secondary | ICD-10-CM | POA: Diagnosis not present

## 2019-03-12 ENCOUNTER — Encounter (HOSPITAL_COMMUNITY): Payer: Self-pay

## 2019-03-12 DIAGNOSIS — G5603 Carpal tunnel syndrome, bilateral upper limbs: Secondary | ICD-10-CM | POA: Diagnosis not present

## 2019-03-12 DIAGNOSIS — N183 Chronic kidney disease, stage 3 (moderate): Secondary | ICD-10-CM | POA: Diagnosis not present

## 2019-03-12 DIAGNOSIS — Z792 Long term (current) use of antibiotics: Secondary | ICD-10-CM | POA: Diagnosis not present

## 2019-03-12 DIAGNOSIS — I509 Heart failure, unspecified: Secondary | ICD-10-CM | POA: Diagnosis not present

## 2019-03-12 DIAGNOSIS — M1990 Unspecified osteoarthritis, unspecified site: Secondary | ICD-10-CM | POA: Diagnosis not present

## 2019-03-12 DIAGNOSIS — B964 Proteus (mirabilis) (morganii) as the cause of diseases classified elsewhere: Secondary | ICD-10-CM | POA: Diagnosis not present

## 2019-03-12 DIAGNOSIS — Z87891 Personal history of nicotine dependence: Secondary | ICD-10-CM | POA: Diagnosis not present

## 2019-03-12 DIAGNOSIS — Z85038 Personal history of other malignant neoplasm of large intestine: Secondary | ICD-10-CM | POA: Diagnosis not present

## 2019-03-12 DIAGNOSIS — J189 Pneumonia, unspecified organism: Secondary | ICD-10-CM | POA: Diagnosis not present

## 2019-03-12 DIAGNOSIS — Z7982 Long term (current) use of aspirin: Secondary | ICD-10-CM | POA: Diagnosis not present

## 2019-03-12 DIAGNOSIS — N130 Hydronephrosis with ureteropelvic junction obstruction: Secondary | ICD-10-CM | POA: Diagnosis not present

## 2019-03-12 DIAGNOSIS — I130 Hypertensive heart and chronic kidney disease with heart failure and stage 1 through stage 4 chronic kidney disease, or unspecified chronic kidney disease: Secondary | ICD-10-CM | POA: Diagnosis not present

## 2019-03-12 DIAGNOSIS — N390 Urinary tract infection, site not specified: Secondary | ICD-10-CM | POA: Diagnosis not present

## 2019-03-12 DIAGNOSIS — I5033 Acute on chronic diastolic (congestive) heart failure: Secondary | ICD-10-CM | POA: Diagnosis not present

## 2019-03-12 DIAGNOSIS — M6281 Muscle weakness (generalized): Secondary | ICD-10-CM | POA: Diagnosis not present

## 2019-03-12 DIAGNOSIS — Z9181 History of falling: Secondary | ICD-10-CM | POA: Diagnosis not present

## 2019-03-12 DIAGNOSIS — E1122 Type 2 diabetes mellitus with diabetic chronic kidney disease: Secondary | ICD-10-CM | POA: Diagnosis not present

## 2019-03-12 NOTE — Progress Notes (Signed)
SPOKE W/  ELLA (DAUGHTER)     SCREENING SYMPTOMS OF COVID 19:   COUGH--NO  RUNNY NOSE--- NO  SORE THROAT---NO  NASAL CONGESTION----NO  SNEEZING----NO  SHORTNESS OF BREATH---NO  DIFFICULTY BREATHING---NO  TEMP >100.0 -----NO  UNEXPLAINED BODY ACHES------NO  CHILLS -------- NO  HEADACHES ---------NO  LOSS OF SMELL/ TASTE --------NO    HAVE YOU OR ANY FAMILY MEMBER TRAVELLED PAST 14 DAYS OUT OF THE   COUNTY---NO STATE----NO COUNTRY----NO  HAVE YOU OR ANY FAMILY MEMBER BEEN EXPOSED TO ANYONE WITH COVID 19? NO

## 2019-03-12 NOTE — Patient Instructions (Addendum)
DUE TO COVID-19 NO VISITORS ARE ALLOWED IN THE HOSPITAL AT THIS TIME   COVID SWAB TESTING MUST BE COMPLETED ON:  Today, Mar 13, 2019 at 10:55AM   Your procedure is scheduled on: Tuesday, Mar 17, 2019   Surgery Time:  11:30AM-12:30PM   Report to [facility]  Entrance    Report to admitting at 9:30 AM   Call this number if you have problems the morning of surgery [phone]   Do not eat food or drink liquids :After Midnight.   Brush your teeth the morning of surgery.   Do NOT smoke after Midnight   Take these medicines the morning of surgery with A SIP OF WATER: Amlodipine, Citalopram, Oxybutynin, Tamsulosin                               You may not have any metal on your body including jewelry, and body piercings             Do not wear lotions, powders, perfumes/cologne, or deodorant                           Men may shave face and neck.   Do not bring valuables to the hospital. [place].   Contacts, dentures or bridgework may not be worn into surgery.    Patients discharged the day of surgery will not be allowed to drive home.   Special Instructions: Bring a copy of your healthcare power of attorney and living will documents         the day of surgery if you haven't scanned them in before.              Please read over the following fact sheets you were given:  [facility] - Preparing for Surgery Before surgery, you can play an important role.  Because skin is not sterile, your skin needs to be as free of germs as possible.  You can reduce the number of germs on your skin by washing with CHG (chlorahexidine gluconate) soap before surgery.  CHG is an antiseptic cleaner which kills germs and bonds with the skin to continue killing germs even after washing. Please DO NOT use if you have an allergy to CHG or antibacterial soaps.  If your skin becomes reddened/irritated stop using the CHG and inform your nurse when  you arrive at Short Stay. Do not shave (including legs and underarms) for at least 48 hours prior to the first CHG shower.  You may shave your face/neck.  Please follow these instructions carefully:  1.  Shower with CHG Soap the night before surgery and the  morning of surgery.  2.  If you choose to wash your hair, wash your hair first as usual with your normal  shampoo.  3.  After you shampoo, rinse your hair and body thoroughly to remove the shampoo.                             4.  Use CHG as you would any other liquid soap.  You can apply chg directly to the skin and wash.  Gently with a scrungie or clean washcloth.  5.  Apply the CHG Soap to your body ONLY FROM THE  NECK DOWN.   Do   not use on face/ open                           Wound or open sores. Avoid contact with eyes, ears mouth and   genitals (private parts).                       Wash face,  Genitals (private parts) with your normal soap.             6.  Wash thoroughly, paying special attention to the area where your    surgery  will be performed.  7.  Thoroughly rinse your body with warm water from the neck down.  8.  DO NOT shower/wash with your normal soap after using and rinsing off the CHG Soap.                9.  Pat yourself dry with a clean towel.            10.  Wear clean pajamas.            11.  Place clean sheets on your bed the night of your first shower and do not  sleep with pets. Day of Surgery : Do not apply any lotions/deodorants the morning of surgery.  Please wear clean clothes to the hospital/surgery center.  FAILURE TO FOLLOW THESE INSTRUCTIONS MAY RESULT IN THE CANCELLATION OF YOUR SURGERY  PATIENT SIGNATURE_________________________________  NURSE SIGNATURE__________________________________  ________________________________________________________________________

## 2019-03-12 NOTE — Pre-Procedure Instructions (Signed)
The following are in epic: Cardiac clearance and last office visit note Dr. Virgina Jock 03/03/2019 EKG 02/16/2019 ECHO 03/03/2019 CXR 02/17/2019

## 2019-03-13 ENCOUNTER — Encounter (HOSPITAL_COMMUNITY): Payer: Self-pay

## 2019-03-13 ENCOUNTER — Encounter (HOSPITAL_COMMUNITY)
Admission: RE | Admit: 2019-03-13 | Discharge: 2019-03-13 | Disposition: A | Discharge status: Home / Self Care | Payer: Medicare Other | Source: Ambulatory Visit | Attending: Urology | Admitting: Urology

## 2019-03-13 ENCOUNTER — Other Ambulatory Visit (HOSPITAL_COMMUNITY)
Admission: RE | Admit: 2019-03-13 | Discharge: 2019-03-13 | Disposition: A | Discharge status: Home / Self Care | Payer: Medicare Other | Source: Ambulatory Visit | Attending: Urology | Admitting: Urology

## 2019-03-13 ENCOUNTER — Other Ambulatory Visit: Payer: Self-pay

## 2019-03-13 DIAGNOSIS — N183 Chronic kidney disease, stage 3 (moderate): Secondary | ICD-10-CM | POA: Insufficient documentation

## 2019-03-13 DIAGNOSIS — I509 Heart failure, unspecified: Secondary | ICD-10-CM | POA: Diagnosis not present

## 2019-03-13 DIAGNOSIS — Z01818 Encounter for other preprocedural examination: Secondary | ICD-10-CM | POA: Diagnosis not present

## 2019-03-13 DIAGNOSIS — M1990 Unspecified osteoarthritis, unspecified site: Secondary | ICD-10-CM | POA: Diagnosis not present

## 2019-03-13 DIAGNOSIS — G5603 Carpal tunnel syndrome, bilateral upper limbs: Secondary | ICD-10-CM | POA: Diagnosis not present

## 2019-03-13 DIAGNOSIS — J189 Pneumonia, unspecified organism: Secondary | ICD-10-CM | POA: Diagnosis not present

## 2019-03-13 DIAGNOSIS — E1122 Type 2 diabetes mellitus with diabetic chronic kidney disease: Secondary | ICD-10-CM | POA: Insufficient documentation

## 2019-03-13 DIAGNOSIS — D494 Neoplasm of unspecified behavior of bladder: Secondary | ICD-10-CM | POA: Diagnosis not present

## 2019-03-13 DIAGNOSIS — Z87891 Personal history of nicotine dependence: Secondary | ICD-10-CM | POA: Insufficient documentation

## 2019-03-13 DIAGNOSIS — N130 Hydronephrosis with ureteropelvic junction obstruction: Secondary | ICD-10-CM | POA: Diagnosis not present

## 2019-03-13 DIAGNOSIS — Z1159 Encounter for screening for other viral diseases: Secondary | ICD-10-CM | POA: Diagnosis not present

## 2019-03-13 DIAGNOSIS — Z79899 Other long term (current) drug therapy: Secondary | ICD-10-CM | POA: Diagnosis not present

## 2019-03-13 DIAGNOSIS — Z9181 History of falling: Secondary | ICD-10-CM | POA: Diagnosis not present

## 2019-03-13 DIAGNOSIS — I130 Hypertensive heart and chronic kidney disease with heart failure and stage 1 through stage 4 chronic kidney disease, or unspecified chronic kidney disease: Secondary | ICD-10-CM | POA: Insufficient documentation

## 2019-03-13 DIAGNOSIS — I447 Left bundle-branch block, unspecified: Secondary | ICD-10-CM | POA: Diagnosis not present

## 2019-03-13 DIAGNOSIS — M6281 Muscle weakness (generalized): Secondary | ICD-10-CM | POA: Diagnosis not present

## 2019-03-13 DIAGNOSIS — Z792 Long term (current) use of antibiotics: Secondary | ICD-10-CM | POA: Diagnosis not present

## 2019-03-13 DIAGNOSIS — Z7982 Long term (current) use of aspirin: Secondary | ICD-10-CM | POA: Diagnosis not present

## 2019-03-13 DIAGNOSIS — N390 Urinary tract infection, site not specified: Secondary | ICD-10-CM | POA: Diagnosis not present

## 2019-03-13 DIAGNOSIS — Z85038 Personal history of other malignant neoplasm of large intestine: Secondary | ICD-10-CM | POA: Insufficient documentation

## 2019-03-13 DIAGNOSIS — I5030 Unspecified diastolic (congestive) heart failure: Secondary | ICD-10-CM | POA: Insufficient documentation

## 2019-03-13 DIAGNOSIS — B964 Proteus (mirabilis) (morganii) as the cause of diseases classified elsewhere: Secondary | ICD-10-CM | POA: Diagnosis not present

## 2019-03-13 DIAGNOSIS — Z8546 Personal history of malignant neoplasm of prostate: Secondary | ICD-10-CM | POA: Diagnosis not present

## 2019-03-13 DIAGNOSIS — I5033 Acute on chronic diastolic (congestive) heart failure: Secondary | ICD-10-CM | POA: Diagnosis not present

## 2019-03-13 HISTORY — DX: Chest pain, unspecified: R07.9

## 2019-03-13 HISTORY — DX: Chronic kidney disease, stage 3 unspecified: N18.30

## 2019-03-13 HISTORY — DX: Other intervertebral disc degeneration, lumbar region without mention of lumbar back pain or lower extremity pain: M51.369

## 2019-03-13 HISTORY — DX: Neoplasm of unspecified behavior of bladder: D49.4

## 2019-03-13 HISTORY — DX: Dyspnea, unspecified: R06.00

## 2019-03-13 HISTORY — DX: Anemia, unspecified: D64.9

## 2019-03-13 HISTORY — DX: Personal history of other diseases of the digestive system: Z87.19

## 2019-03-13 HISTORY — DX: Hematuria, unspecified: R31.9

## 2019-03-13 HISTORY — DX: Other intervertebral disc degeneration, lumbar region: M51.36

## 2019-03-13 HISTORY — DX: Neuralgia and neuritis, unspecified: M79.2

## 2019-03-13 HISTORY — DX: Unspecified diastolic (congestive) heart failure: I50.30

## 2019-03-13 HISTORY — DX: Dizziness and giddiness: R42

## 2019-03-13 LAB — BASIC METABOLIC PANEL
Anion gap: 10 (ref 5–15)
BUN: 56 mg/dL — ABNORMAL HIGH (ref 8–23)
CO2: 23 mmol/L (ref 22–32)
Calcium: 8.8 mg/dL — ABNORMAL LOW (ref 8.9–10.3)
Chloride: 107 mmol/L (ref 98–111)
Creatinine, Ser: 2.75 mg/dL — ABNORMAL HIGH (ref 0.61–1.24)
GFR calc Af Amer: 22 mL/min — ABNORMAL LOW (ref >60)
GFR calc non Af Amer: 19 mL/min — ABNORMAL LOW (ref >60)
Glucose, Bld: 75 mg/dL (ref 70–99)
Potassium: 3.9 mmol/L (ref 3.5–5.1)
Sodium: 140 mmol/L (ref 135–145)

## 2019-03-13 LAB — CBC
HCT: 33.1 % — ABNORMAL LOW (ref 39.0–52.0)
Hemoglobin: 10.3 g/dL — ABNORMAL LOW (ref 13.0–17.0)
MCH: 26.1 pg (ref 26.0–34.0)
MCHC: 31.1 g/dL (ref 30.0–36.0)
MCV: 84 fL (ref 80.0–100.0)
Platelets: 145 10*3/uL — ABNORMAL LOW (ref 150–400)
RBC: 3.94 MIL/uL — ABNORMAL LOW (ref 4.22–5.81)
RDW: 15.7 % — ABNORMAL HIGH (ref 11.5–15.5)
WBC: 3.3 10*3/uL — ABNORMAL LOW (ref 4.0–10.5)
nRBC: 0 % (ref 0.0–0.2)

## 2019-03-13 LAB — HEMOGLOBIN A1C
Hgb A1c MFr Bld: 5.4 % (ref 4.8–5.6)
Mean Plasma Glucose: 108.28 mg/dL

## 2019-03-13 NOTE — Anesthesia Preprocedure Evaluation (Addendum)
Anesthesia Evaluation  Patient identified by MRN, date of birth, ID band Patient awake    Reviewed: Allergy & Precautions, H&P , NPO status , Patient's Chart, lab work & pertinent test results, reviewed documented beta blocker date and time   Airway Mallampati: I  TM Distance: >3 FB Neck ROM: full    Dental no notable dental hx. (+) Poor Dentition, Chipped, Missing   Pulmonary neg pulmonary ROS, former smoker,    Pulmonary exam normal breath sounds clear to auscultation       Cardiovascular Exercise Tolerance: Good hypertension, Pt. on medications +CHF  + dysrhythmias  Rhythm:regular Rate:Normal  EKG: 02/16/2019 Rate 52 bpm Sinus rhythm  LBBB  CV: Echo 12/03/17  Left ventricle: The cavity size was normal. There was moderate focal basal hypertrophy of the septum. Systolic function was normal. The estimated ejection fraction was in the range of 60% to 65%. Wall motion was normal; there were no regional wall motion abnormalities. Doppler parameters are consistent with abnormal left ventricular relaxation (grade 1 diastolic dysfunction). - Aortic valve: Trileaflet; mildly thickened, mildly calcified leaflets. - Mitral valve: There was mild regurgitation. - Left atrium: The atrium was mildly dilated. - Right atrium: The atrium was mildly dilated. - Tricuspid valve: There was mild regurgitation. - Pulmonary arteries: Systolic pressure was mildly increased. PA peak pressure: 45 mm Hg    Neuro/Psych  Neuromuscular disease negative psych ROS   GI/Hepatic negative GI ROS, Neg liver ROS,   Endo/Other  diabetes, Type 2  Renal/GU CRF and ARFRenal disease  negative genitourinary   Musculoskeletal  (+) Arthritis , Osteoarthritis,    Abdominal   Peds  Hematology  (+) Blood dyscrasia, anemia ,   Anesthesia Other Findings   Reproductive/Obstetrics negative OB ROS                             Anesthesia Physical Anesthesia Plan  ASA: IV  Anesthesia Plan: General   Post-op Pain Management:    Induction: Intravenous  PONV Risk Score and Plan: 2 and Treatment may vary due to age or medical condition  Airway Management Planned: LMA  Additional Equipment:   Intra-op Plan:   Post-operative Plan: Extubation in OR  Informed Consent: I have reviewed the patients History and Physical, chart, labs and discussed the procedure including the risks, benefits and alternatives for the proposed anesthesia with the patient or authorized representative who has indicated his/her understanding and acceptance.     Dental Advisory Given  Plan Discussed with: CRNA, Anesthesiologist and Surgeon  Anesthesia Plan Comments: (See PAT note 03/13/19, Konrad Felix, PA-C)       Anesthesia Quick Evaluation

## 2019-03-13 NOTE — Progress Notes (Signed)
Anesthesia Chart Review   Case:  [id number] Date/Time:  03/17/19 1115   Procedure:  TRANSURETHRAL RESECTION OF BLADDER TUMOR (TURBT) (N/A )   Anesthesia type:  General   Pre-op diagnosis:  BLADDER TUMOR   Location:  [place] [facility]   Surgeon:  Cleon Gustin, MD      DISCUSSION:85 yo former smoker (quit 06/10/83) with h/o HTN, LBBB, anemia, CKD Stage III, prostate cancer, colon cancer, diastolic heart failure, DM II, bladder tumor scheduled for above procedure 03/17/19 with Dr. Nicolette Bang.   Pt last seen by cardiologist, Dr. Vernell Leep, 03/03/2019.  Per his OV note, "Echocardiogram today shows mild diastolic dysfunction, mild aortic stenosis, mild mitral and tricuspid regurgitation. LVEF is preserved. Overall, no alarming findings on resting echocardiogram. Clinically, he appears euvolumic. His perioperative risk for noncardiac surgery remains moderate by virtue of his age.  He does not have any chest pain symptoms at this time.  I do not think stress test will change his perioperative risk.  His creatinine is elevated at baseline, thus coronary angiography will only increase risk of contrast induced nephropathy."  Pt can proceed with planned procedure barring acute status change.  VS: BP (!) 107/50   Pulse (!) 53   Resp 16   Ht '5\' 4"'$  (1.626 m)   Wt 84 kg   SpO2 97%   BMI 31.80 kg/m   PROVIDERS: Lucianne Lei, MD is PCP   Vernell Leep, MD is Cardiologist  LABS: Labs reviewed: Acceptable for surgery. (all labs ordered are listed, but only abnormal results are displayed)  Labs Reviewed  BASIC METABOLIC PANEL - Abnormal; Notable for the following components:      Result Value   BUN 56 (*)    Creatinine, Ser 2.75 (*)    Calcium 8.8 (*)    GFR calc non Af Amer 19 (*)    GFR calc Af Amer 22 (*)    All other components within normal limits  CBC - Abnormal; Notable for the following components:   WBC 3.3 (*)    RBC 3.94 (*)    Hemoglobin 10.3 (*)    HCT 33.1 (*)    RDW 15.7 (*)    Platelets 145 (*)    All other components within normal limits  HEMOGLOBIN A1C     IMAGES: Chest Xray 02/17/19 IMPRESSION: 1. Cardiomegaly and moderate pulmonary vascular congestion. 2. Aortic atherosclerosis. 3. Right pleural effusion. 4. Mild right basilar airspace disease likely reflects atelectasis. Infection is not excluded.   EKG: 02/16/2019 Rate 52 bpm Sinus rhythm  LBBB  CV: Echo 12/03/17 Study Conclusions  - Left ventricle: The cavity size was normal. There was moderate   focal basal hypertrophy of the septum. Systolic function was   normal. The estimated ejection fraction was in the range of 60%   to 65%. Wall motion was normal; there were no regional wall   motion abnormalities. Doppler parameters are consistent with   abnormal left ventricular relaxation (grade 1 diastolic   dysfunction). - Aortic valve: Trileaflet; mildly thickened, mildly calcified   leaflets. - Mitral valve: There was mild regurgitation. - Left atrium: The atrium was mildly dilated. - Right atrium: The atrium was mildly dilated. - Tricuspid valve: There was mild regurgitation. - Pulmonary arteries: Systolic pressure was mildly increased. PA   peak pressure: 45 mm Hg (S).  Impressions:  - Compared to the prior study, there has been no significant   interval change. Past Medical History:  Diagnosis Date  .  Adenomatous polyps 09/09/2008  . Anemia   . Aortic atherosclerosis ([place]) 01/2019  . Arthritis   . Bladder tumor   . Blood in urine   . Cardiomegaly 01/2019  . Carpal tunnel syndrome, bilateral 11/21/2016  . Chest pain on exertion   . Chronic kidney disease (CKD), stage III (moderate) ([facility])   . Colon cancer ([place]) dx'd 2008   surg only  . DDD (degenerative disc disease), lumbar   . Diabetes mellitus without complication ([place])    Diet controlled  . Diastolic heart failure ([place])   . Dyspnea   . Falls frequently   . Hearing problem   .  History of GI bleed   . Hypertension   . LBBB (left bundle branch block) 02/16/2019   noted on EKG  . Lipid disorder   . LVH (left ventricular hypertrophy) 03/03/2019    Moderate, Noted on ECHO  . Nerve pain   . Pleural effusion 01/2019   Right  . Pneumonia 01/2019  . Prostate cancer ([place]) dx'd 1993   surg only  . Thyroid cyst 09/2007   Bilateral  . Vertigo     Past Surgical History:  Procedure Laterality Date  . CATARACT EXTRACTION W/PHACO Left 06/10/2013   Procedure: CATARACT EXTRACTION PHACO AND INTRAOCULAR LENS PLACEMENT (IOC);  Surgeon: Adonis Brook, MD;  Location: [place];  Service: Ophthalmology;  Laterality: Left;  . COLONOSCOPY    . ESOPHAGOGASTRODUODENOSCOPY    . EYE SURGERY     cataract surgery, right eye  . laparoscopic assisted right hemicolectomy  09/21/07  . PROSTATECTOMY      MEDICATIONS: . amLODipine (NORVASC) 10 MG tablet  . aspirin EC 81 MG tablet  . Blood Glucose Monitoring Suppl (ONE TOUCH ULTRA 2) w/Device KIT  . citalopram (CELEXA) 10 MG tablet  . ENTRESTO 24-26 MG  . furosemide (LASIX) 40 MG tablet  . ipratropium-albuterol (DUONEB) 0.5-2.5 (3) MG/3ML SOLN  . ONE TOUCH ULTRA TEST test strip  . oxybutynin (DITROPAN) 5 MG tablet  . polyethylene glycol (MIRALAX / GLYCOLAX) 17 g packet  . tamsulosin (FLOMAX) 0.4 MG CAPS capsule   No current facility-administered medications for this encounter.    Maia Plan [facility] Pre-Surgical Testing [phone] 03/13/19 12:17 PM

## 2019-03-14 LAB — NOVEL CORONAVIRUS, NAA (HOSP ORDER, SEND-OUT TO REF LAB; TAT 18-24 HRS): SARS-CoV-2, NAA: NOT DETECTED

## 2019-03-16 NOTE — Progress Notes (Signed)
SPOKE W/  _family member     SCREENING SYMPTOMS OF COVID 19:   COUGH--no  RUNNY NOSE--- no  SORE THROAT---no  NASAL CONGESTION----no  SNEEZING--no--  SHORTNESS OF BREATH---no  DIFFICULTY BREATHING---no  TEMP >100.0 -----no  UNEXPLAINED BODY ACHES------no  CHILLS -------- no  HEADACHES ---------no  LOSS OF SMELL/ TASTE --------no    HAVE YOU OR ANY FAMILY MEMBER TRAVELLED PAST 14 DAYS OUT OF THE   COUNTY---no STATE----no COUNTRY----no  HAVE YOU OR ANY FAMILY MEMBER BEEN EXPOSED TO ANYONE WITH COVID 19? no

## 2019-03-17 ENCOUNTER — Ambulatory Visit (HOSPITAL_COMMUNITY): Payer: Medicare Other | Admitting: Anesthesiology

## 2019-03-17 ENCOUNTER — Other Ambulatory Visit: Payer: Self-pay

## 2019-03-17 ENCOUNTER — Ambulatory Visit (HOSPITAL_COMMUNITY): Payer: Medicare Other | Admitting: Physician Assistant

## 2019-03-17 ENCOUNTER — Encounter (HOSPITAL_COMMUNITY)
Admission: RE | Disposition: A | Discharge status: Home / Self Care | Payer: Self-pay | Source: Home / Self Care | Attending: Urology

## 2019-03-17 ENCOUNTER — Encounter (HOSPITAL_COMMUNITY): Payer: Self-pay | Admitting: *Deleted

## 2019-03-17 ENCOUNTER — Observation Stay (HOSPITAL_COMMUNITY)
Admission: RE | Admit: 2019-03-17 | Discharge: 2019-03-19 | Disposition: A | Discharge status: Home / Self Care | Payer: Medicare Other | Attending: Urology | Admitting: Urology

## 2019-03-17 DIAGNOSIS — Z87891 Personal history of nicotine dependence: Secondary | ICD-10-CM | POA: Diagnosis not present

## 2019-03-17 DIAGNOSIS — M1990 Unspecified osteoarthritis, unspecified site: Secondary | ICD-10-CM | POA: Insufficient documentation

## 2019-03-17 DIAGNOSIS — E119 Type 2 diabetes mellitus without complications: Secondary | ICD-10-CM | POA: Diagnosis not present

## 2019-03-17 DIAGNOSIS — D494 Neoplasm of unspecified behavior of bladder: Secondary | ICD-10-CM | POA: Diagnosis not present

## 2019-03-17 DIAGNOSIS — Z8546 Personal history of malignant neoplasm of prostate: Secondary | ICD-10-CM | POA: Diagnosis not present

## 2019-03-17 DIAGNOSIS — N183 Chronic kidney disease, stage 3 (moderate): Secondary | ICD-10-CM | POA: Diagnosis not present

## 2019-03-17 DIAGNOSIS — G5603 Carpal tunnel syndrome, bilateral upper limbs: Secondary | ICD-10-CM | POA: Diagnosis not present

## 2019-03-17 DIAGNOSIS — C673 Malignant neoplasm of anterior wall of bladder: Principal | ICD-10-CM | POA: Insufficient documentation

## 2019-03-17 DIAGNOSIS — E1122 Type 2 diabetes mellitus with diabetic chronic kidney disease: Secondary | ICD-10-CM | POA: Insufficient documentation

## 2019-03-17 DIAGNOSIS — I130 Hypertensive heart and chronic kidney disease with heart failure and stage 1 through stage 4 chronic kidney disease, or unspecified chronic kidney disease: Secondary | ICD-10-CM | POA: Insufficient documentation

## 2019-03-17 DIAGNOSIS — I5032 Chronic diastolic (congestive) heart failure: Secondary | ICD-10-CM | POA: Diagnosis not present

## 2019-03-17 DIAGNOSIS — Z85038 Personal history of other malignant neoplasm of large intestine: Secondary | ICD-10-CM | POA: Insufficient documentation

## 2019-03-17 DIAGNOSIS — C679 Malignant neoplasm of bladder, unspecified: Secondary | ICD-10-CM | POA: Diagnosis not present

## 2019-03-17 DIAGNOSIS — R310 Gross hematuria: Secondary | ICD-10-CM | POA: Diagnosis not present

## 2019-03-17 HISTORY — PX: TRANSURETHRAL RESECTION OF BLADDER TUMOR: SHX2575

## 2019-03-17 HISTORY — DX: Disorientation, unspecified: R41.0

## 2019-03-17 LAB — BASIC METABOLIC PANEL
Anion gap: 7 (ref 5–15)
BUN: 53 mg/dL — ABNORMAL HIGH (ref 8–23)
CO2: 26 mmol/L (ref 22–32)
Calcium: 8.9 mg/dL (ref 8.9–10.3)
Chloride: 111 mmol/L (ref 98–111)
Creatinine, Ser: 2.6 mg/dL — ABNORMAL HIGH (ref 0.61–1.24)
GFR calc Af Amer: 24 mL/min — ABNORMAL LOW (ref >60)
GFR calc non Af Amer: 20 mL/min — ABNORMAL LOW (ref >60)
Glucose, Bld: 97 mg/dL (ref 70–99)
Potassium: 3.8 mmol/L (ref 3.5–5.1)
Sodium: 144 mmol/L (ref 135–145)

## 2019-03-17 LAB — CBC
HCT: 30 % — ABNORMAL LOW (ref 39.0–52.0)
Hemoglobin: 9.6 g/dL — ABNORMAL LOW (ref 13.0–17.0)
MCH: 26.9 pg (ref 26.0–34.0)
MCHC: 32 g/dL (ref 30.0–36.0)
MCV: 84 fL (ref 80.0–100.0)
Platelets: 123 10*3/uL — ABNORMAL LOW (ref 150–400)
RBC: 3.57 MIL/uL — ABNORMAL LOW (ref 4.22–5.81)
RDW: 15.9 % — ABNORMAL HIGH (ref 11.5–15.5)
WBC: 3.4 10*3/uL — ABNORMAL LOW (ref 4.0–10.5)
nRBC: 0 % (ref 0.0–0.2)

## 2019-03-17 LAB — GLUCOSE, CAPILLARY
Glucose-Capillary: 95 mg/dL (ref 70–99)
Glucose-Capillary: 95 mg/dL (ref 70–99)

## 2019-03-17 SURGERY — TURBT (TRANSURETHRAL RESECTION OF BLADDER TUMOR)
Anesthesia: General

## 2019-03-17 MED ORDER — OXYCODONE HCL 5 MG/5ML PO SOLN: 5.0000 mg | Freq: Once | ORAL | Status: DC | PRN

## 2019-03-17 MED ORDER — SODIUM CHLORIDE 0.9 % IR SOLN
Status: DC | PRN
  Administered 2019-03-17: 12000 mL

## 2019-03-17 MED ORDER — LACTATED RINGERS IV SOLN
INTRAVENOUS | Status: DC
  Administered 2019-03-17: 11:00:00 via INTRAVENOUS

## 2019-03-17 MED ORDER — SACUBITRIL-VALSARTAN 24-26 MG PO TABS
1.0000 | ORAL_TABLET | Freq: Two times a day (BID) | ORAL | Status: DC
  Administered 2019-03-18: 1 via ORAL
  Filled 2019-03-17 (×4): qty 1

## 2019-03-17 MED ORDER — HYDROCODONE-ACETAMINOPHEN 5-325 MG PO TABS
1.0000 | ORAL_TABLET | ORAL | Status: DC | PRN
  Administered 2019-03-18 – 2019-03-19 (×2): 1 via ORAL
  Filled 2019-03-17 (×2): qty 1

## 2019-03-17 MED ORDER — OXYBUTYNIN CHLORIDE 5 MG PO TABS
2.5000 mg | ORAL_TABLET | Freq: Three times a day (TID) | ORAL | Status: DC
  Administered 2019-03-17 – 2019-03-19 (×6): 2.5 mg via ORAL
  Filled 2019-03-17 (×6): qty 1

## 2019-03-17 MED ORDER — MEPERIDINE HCL 50 MG/ML IJ SOLN: 6.2500 mg | INTRAMUSCULAR | Status: DC | PRN

## 2019-03-17 MED ORDER — CEFAZOLIN SODIUM-DEXTROSE 2-3 GM-%(50ML) IV SOLR
INTRAVENOUS | Status: DC | PRN
  Administered 2019-03-17: 2 g via INTRAVENOUS

## 2019-03-17 MED ORDER — LIDOCAINE HCL (CARDIAC) PF 100 MG/5ML IV SOSY
PREFILLED_SYRINGE | INTRAVENOUS | Status: DC | PRN
  Administered 2019-03-17: 50 mg via INTRAVENOUS

## 2019-03-17 MED ORDER — PROPOFOL 10 MG/ML IV BOLUS
INTRAVENOUS | Status: DC | PRN
  Administered 2019-03-17: 110 mg via INTRAVENOUS

## 2019-03-17 MED ORDER — ZOLPIDEM TARTRATE 5 MG PO TABS: 5.0000 mg | ORAL_TABLET | Freq: Every evening | ORAL | Status: DC | PRN

## 2019-03-17 MED ORDER — FENTANYL CITRATE (PF) 100 MCG/2ML IJ SOLN: 25.0000 ug | INTRAMUSCULAR | Status: DC | PRN

## 2019-03-17 MED ORDER — FENTANYL CITRATE (PF) 100 MCG/2ML IJ SOLN
INTRAMUSCULAR | Status: DC | PRN
  Administered 2019-03-17 (×2): 50 ug via INTRAVENOUS
  Administered 2019-03-17: 25 ug via INTRAVENOUS

## 2019-03-17 MED ORDER — ONDANSETRON HCL 4 MG/2ML IJ SOLN
INTRAMUSCULAR | Status: AC
  Filled 2019-03-17: qty 4

## 2019-03-17 MED ORDER — IPRATROPIUM-ALBUTEROL 0.5-2.5 (3) MG/3ML IN SOLN: 3.0000 mL | Freq: Four times a day (QID) | RESPIRATORY_TRACT | Status: DC | PRN

## 2019-03-17 MED ORDER — EPHEDRINE SULFATE 50 MG/ML IJ SOLN
INTRAMUSCULAR | Status: DC | PRN
  Administered 2019-03-17 (×5): 5 mg via INTRAVENOUS

## 2019-03-17 MED ORDER — EPHEDRINE 5 MG/ML INJ
INTRAVENOUS | Status: AC
  Filled 2019-03-17: qty 10

## 2019-03-17 MED ORDER — CEFAZOLIN SODIUM-DEXTROSE 2-4 GM/100ML-% IV SOLN
INTRAVENOUS | Status: AC
  Filled 2019-03-17: qty 100

## 2019-03-17 MED ORDER — OXYCODONE HCL 5 MG PO TABS: 5.0000 mg | ORAL_TABLET | Freq: Once | ORAL | Status: DC | PRN

## 2019-03-17 MED ORDER — FENTANYL CITRATE (PF) 100 MCG/2ML IJ SOLN
INTRAMUSCULAR | Status: AC
  Filled 2019-03-17: qty 2

## 2019-03-17 MED ORDER — FUROSEMIDE 40 MG PO TABS
40.0000 mg | ORAL_TABLET | Freq: Every day | ORAL | Status: DC
  Administered 2019-03-18 – 2019-03-19 (×2): 40 mg via ORAL
  Filled 2019-03-17 (×2): qty 1

## 2019-03-17 MED ORDER — DIPHENHYDRAMINE HCL 50 MG/ML IJ SOLN: 12.5000 mg | Freq: Four times a day (QID) | INTRAMUSCULAR | Status: DC | PRN

## 2019-03-17 MED ORDER — SODIUM CHLORIDE 0.9 % IV SOLN
INTRAVENOUS | Status: DC
  Administered 2019-03-17: 15:00:00 via INTRAVENOUS

## 2019-03-17 MED ORDER — PHENYLEPHRINE 40 MCG/ML (10ML) SYRINGE FOR IV PUSH (FOR BLOOD PRESSURE SUPPORT)
PREFILLED_SYRINGE | INTRAVENOUS | Status: AC
  Filled 2019-03-17: qty 10

## 2019-03-17 MED ORDER — PHENYLEPHRINE HCL (PRESSORS) 10 MG/ML IV SOLN
INTRAVENOUS | Status: DC | PRN
  Administered 2019-03-17 (×2): 80 ug via INTRAVENOUS

## 2019-03-17 MED ORDER — DIPHENHYDRAMINE HCL 12.5 MG/5ML PO ELIX: 12.5000 mg | ORAL_SOLUTION | Freq: Four times a day (QID) | ORAL | Status: DC | PRN

## 2019-03-17 MED ORDER — AMLODIPINE BESYLATE 10 MG PO TABS
10.0000 mg | ORAL_TABLET | Freq: Every day | ORAL | Status: DC
  Administered 2019-03-18: 09:00:00 10 mg via ORAL
  Filled 2019-03-17 (×2): qty 1

## 2019-03-17 MED ORDER — ONDANSETRON HCL 4 MG/2ML IJ SOLN: 4.0000 mg | Freq: Once | INTRAMUSCULAR | Status: DC | PRN

## 2019-03-17 MED ORDER — BELLADONNA ALKALOIDS-OPIUM 16.2-60 MG RE SUPP: 1.0000 | Freq: Four times a day (QID) | RECTAL | Status: DC | PRN

## 2019-03-17 MED ORDER — ASPIRIN EC 81 MG PO TBEC
81.0000 mg | DELAYED_RELEASE_TABLET | Freq: Every day | ORAL | Status: DC
  Administered 2019-03-17 – 2019-03-19 (×3): 81 mg via ORAL
  Filled 2019-03-17 (×3): qty 1

## 2019-03-17 MED ORDER — ONDANSETRON HCL 4 MG/2ML IJ SOLN
INTRAMUSCULAR | Status: DC | PRN
  Administered 2019-03-17: 4 mg via INTRAVENOUS

## 2019-03-17 MED ORDER — LIDOCAINE 2% (20 MG/ML) 5 ML SYRINGE
INTRAMUSCULAR | Status: AC
  Filled 2019-03-17: qty 10

## 2019-03-17 MED ORDER — DEXAMETHASONE SODIUM PHOSPHATE 10 MG/ML IJ SOLN
INTRAMUSCULAR | Status: AC
  Filled 2019-03-17: qty 2

## 2019-03-17 MED ORDER — ONDANSETRON HCL 4 MG/2ML IJ SOLN: 4.0000 mg | INTRAMUSCULAR | Status: DC | PRN

## 2019-03-17 MED ORDER — ACETAMINOPHEN 160 MG/5ML PO SOLN: 325.0000 mg | ORAL | Status: DC | PRN

## 2019-03-17 MED ORDER — ACETAMINOPHEN 325 MG PO TABS: 325.0000 mg | ORAL_TABLET | ORAL | Status: DC | PRN

## 2019-03-17 MED ORDER — TAMSULOSIN HCL 0.4 MG PO CAPS
0.4000 mg | ORAL_CAPSULE | Freq: Every day | ORAL | Status: DC
  Administered 2019-03-18 – 2019-03-19 (×2): 0.4 mg via ORAL
  Filled 2019-03-17 (×2): qty 1

## 2019-03-17 MED ORDER — CITALOPRAM HYDROBROMIDE 20 MG PO TABS
10.0000 mg | ORAL_TABLET | Freq: Every day | ORAL | Status: DC
  Administered 2019-03-18 – 2019-03-19 (×2): 10 mg via ORAL
  Filled 2019-03-17 (×2): qty 1

## 2019-03-17 MED ORDER — ACETAMINOPHEN 325 MG PO TABS: 650.0000 mg | ORAL_TABLET | ORAL | Status: DC | PRN

## 2019-03-17 MED ORDER — SODIUM CHLORIDE 0.9 % IR SOLN
3000.0000 mL | Status: DC
  Administered 2019-03-17 (×3): 3000 mL

## 2019-03-17 SURGICAL SUPPLY — 20 items
BAG URINE DRAINAGE (UROLOGICAL SUPPLIES) ×2 IMPLANT
BAG URO CATCHER STRL LF (MISCELLANEOUS) ×3 IMPLANT
CATH FOLEY 3WAY 30CC 22FR (CATHETERS) ×2 IMPLANT
ELECT REM PT RETURN 15FT ADLT (MISCELLANEOUS) ×3 IMPLANT
EVACUATOR MICROVAS BLADDER (UROLOGICAL SUPPLIES) IMPLANT
GLOVE BIOGEL M 8.0 STRL (GLOVE) ×3 IMPLANT
GLOVE BIOGEL PI IND STRL 7.0 (GLOVE) IMPLANT
GLOVE BIOGEL PI INDICATOR 7.0 (GLOVE) ×2
GLOVE SURG SS PI 7.0 STRL IVOR (GLOVE) ×2 IMPLANT
GOWN STRL REUS W/TWL LRG LVL3 (GOWN DISPOSABLE) ×3 IMPLANT
GOWN STRL REUS W/TWL XL LVL3 (GOWN DISPOSABLE) ×3 IMPLANT
KIT TURNOVER KIT A (KITS) ×2 IMPLANT
LOOP CUT BIPOLAR 24F LRG (ELECTROSURGICAL) ×2 IMPLANT
MANIFOLD NEPTUNE II (INSTRUMENTS) ×3 IMPLANT
PACK CYSTO (CUSTOM PROCEDURE TRAY) ×3 IMPLANT
SET ASPIRATION TUBING (TUBING) IMPLANT
SYRINGE IRR TOOMEY STRL 70CC (SYRINGE) ×2 IMPLANT
TUBING CONNECTING 10 (TUBING) ×2 IMPLANT
TUBING CONNECTING 10' (TUBING) ×1
TUBING UROLOGY SET (TUBING) ×3 IMPLANT

## 2019-03-17 NOTE — Anesthesia Procedure Notes (Signed)
Procedure Name: LMA Insertion Date/Time: 03/17/2019 11:36 AM Performed by: Lavina Hamman, CRNA Pre-anesthesia Checklist: Patient identified, Emergency Drugs available, Suction available and Patient being monitored Patient Re-evaluated:Patient Re-evaluated prior to induction Oxygen Delivery Method: Circle System Utilized Preoxygenation: Pre-oxygenation with 100% oxygen Induction Type: IV induction Ventilation: Mask ventilation without difficulty LMA: LMA inserted LMA Size: 4.0 Number of attempts: 1 Airway Equipment and Method: Bite block Placement Confirmation: positive ETCO2 Tube secured with: Tape Dental Injury: Teeth and Oropharynx as per pre-operative assessment

## 2019-03-17 NOTE — Anesthesia Postprocedure Evaluation (Signed)
Anesthesia Post Note  Patient: Armed forces technical officer  Procedure(s) Performed: TRANSURETHRAL RESECTION OF BLADDER TUMOR (TURBT) (N/A )     Patient location during evaluation: PACU Anesthesia Type: General Level of consciousness: awake and alert Pain management: pain level controlled Vital Signs Assessment: post-procedure vital signs reviewed and stable Respiratory status: spontaneous breathing, nonlabored ventilation, respiratory function stable and patient connected to nasal cannula oxygen Cardiovascular status: blood pressure returned to baseline and stable Postop Assessment: no apparent nausea or vomiting Anesthetic complications: no    Last Vitals:  Vitals:   03/17/19 1300 03/17/19 1315  BP: (!) 101/48 (!) 99/44  Pulse: 61 (!) 58  Resp: 13 13  Temp:    SpO2: 100% 99%    Last Pain:  Vitals:   03/17/19 1315  TempSrc:   PainSc: Asleep                 Acen Craun

## 2019-03-17 NOTE — H&P (Signed)
Urology Admission H&P  Chief Complaint: gross hematuria  History of Present Illness: [name] is a 85yo with a history of gross hematuria who was found to have a bladder tumor on office cystoscopy. He has intermittent gross hematuria. No new LUTS. No fevers/chills/sweats  Past Medical History:  Diagnosis Date  . Adenomatous polyps 09/09/2008  . Anemia   . Aortic atherosclerosis ([place]) 01/2019  . Arthritis   . Bladder tumor   . Blood in urine   . Cardiomegaly 01/2019  . Carpal tunnel syndrome, bilateral 11/21/2016  . Chest pain on exertion   . Chronic kidney disease (CKD), stage III (moderate) (HCC)   . Colon cancer ([place]) dx'd 2008   surg only  . Confusion    with last hospital visit, april 2020  . DDD (degenerative disc disease), lumbar   . Diabetes mellitus without complication ([place])    Diet controlled  . Diastolic heart failure ([place])   . Dyspnea   . Falls frequently   . Hearing problem   . History of GI bleed   . Hypertension   . LBBB (left bundle branch block) 02/16/2019   noted on EKG  . Lipid disorder   . LVH (left ventricular hypertrophy) 03/03/2019    Moderate, Noted on ECHO  . Nerve pain   . Pleural effusion 01/2019   Right  . Pneumonia 01/2019  . Prostate cancer ([place]) dx'd 1993   surg only  . Thyroid cyst 09/2007   Bilateral  . Vertigo    Past Surgical History:  Procedure Laterality Date  . CATARACT EXTRACTION W/PHACO Left 06/10/2013   Procedure: CATARACT EXTRACTION PHACO AND INTRAOCULAR LENS PLACEMENT (IOC);  Surgeon: Adonis Brook, MD;  Location: [facility];  Service: Ophthalmology;  Laterality: Left;  . COLONOSCOPY    . ESOPHAGOGASTRODUODENOSCOPY    . EYE SURGERY     cataract surgery, right eye  . laparoscopic assisted right hemicolectomy  09/21/07  . PROSTATECTOMY      Home Medications:  No current facility-administered medications for this encounter.    Allergies: No Known Allergies  Family History  Problem Relation Age of Onset  . Diabetes Other    . Hypertension Mother   . Stroke Mother    Social History:  reports that he quit smoking about 35 years ago. His smoking use included cigarettes. He quit after 1.00 year of use. He has never used smokeless tobacco. He reports previous alcohol use. He reports that he does not use drugs.  Review of Systems  Genitourinary: Positive for hematuria.  All other systems reviewed and are negative.   Physical Exam:  Vital signs in last 24 hours: Temp:  [97.6 F (36.4 C)] 97.6 F (36.4 C) (05/26 0917) Pulse Rate:  [57] 57 (05/26 0917) Resp:  [15] 15 (05/26 0917) BP: (152)/(58) 152/58 (05/26 0917) SpO2:  [98 %] 98 % (05/26 0917) Physical Exam  Constitutional: He is oriented to person, place, and time. He appears well-developed and well-nourished.  HENT:  Head: Normocephalic and atraumatic.  Eyes: Pupils are equal, round, and reactive to light. EOM are normal.  Neck: Normal range of motion. No thyromegaly present.  Cardiovascular: Normal rate and regular rhythm.  Respiratory: Effort normal. No respiratory distress.  GI: Soft. He exhibits no distension.  Musculoskeletal: Normal range of motion.        General: No edema.  Neurological: He is alert and oriented to person, place, and time.  Skin: Skin is warm and dry.  Psychiatric: He has a normal mood  and affect. His behavior is normal. Judgment and thought content normal.    Laboratory Data:  No results found for this or any previous visit (from the past 24 hour(s)). Recent Results (from the past 240 hour(s))  Novel Coronavirus, NAA (hospital order; send-out to ref lab)     Status: None   Collection Time: 03/13/19 11:13 AM  Result Value Ref Range Status   SARS-CoV-2, NAA NOT DETECTED NOT DETECTED Final    Comment: (NOTE) Testing was performed using the cobas(R) SARS-CoV-2 test. This test was developed and its performance characteristics determined by [employer]. This test has not been FDA cleared or approved. This test has  been authorized by FDA under an Emergency Use Authorization (EUA). This test is only authorized for the duration of time the declaration that circumstances exist justifying the authorization of the emergency use of in vitro diagnostic tests for detection of SARS-CoV-2 virus and/or diagnosis of COVID-19 infection under section 564(b)(1) of the Act, 21 U.S.C. 761YWV-3(X)(1), unless the authorization is terminated or revoked sooner. When diagnostic testing is negative, the possibility of a false negative result should be considered in the context of a patient's recent exposures and the presence of clinical signs and symptoms consistent with COVID-19. An individual without symptoms of COVID-19 and who is not shedding SARS-CoV-2 virus would expect to have  a negative (not detected) result in this assay. Performed At: [facility] [address] [place], [place] [id number] Rush Farmer MD OE:[id number]    [place]  Final    Comment: Performed at [facility], [place] [address] [place], [place] [address]   Creatinine: Recent Labs    03/13/19 0955  CREATININE 2.75*   Baseline Creatinine: 2.5  Impression/Assessment:  85yo with bladder tumor  Plan:  The risks/benefits/alternatives to TURBT was explained to the patient and he understands and wishes to proceed with surgery  [name] 03/17/2019, 10:45 AM

## 2019-03-17 NOTE — Op Note (Signed)
.  Preoperative diagnosis: bladder tumor  Postoperative diagnosis: Same  Procedure: 1 cystoscopy 2.  Clot evacuation 3. Transurethral resection of bladder tumor, large  Attending: Nicolette Bang  Anesthesia: General  Estimated blood loss: Minimal  Drains: 22 French foley  Specimens: bladder tumor  Antibiotics: ancef  Findings:  50cc clot in the bladder. 6cm papillary anterior wall tumor.  Ureteral orifices in normal anatomic location.  Indications: Patient is a 85 year old male with a history of bladder tumor and gross hematuria.  After discussing treatment options, they decided proceed with transurethral resection of a bladder tumor.  Procedure her in detail: The patient was brought to the operating room and a brief timeout was done to ensure correct patient, correct procedure, correct site.  General anesthesia was administered patient was placed in dorsal lithotomy position.  Their genitalia was then prepped and draped in usual sterile fashion.  A rigid 50 French cystoscope was passed in the urethra and the bladder.  Bladder was inspected and we noted a large amount of clot and a 6cm bladder tumor.  the ureteral orifices were in the normal orthotopic locations.  We then removed the cystoscope and placed a resectoscope into the bladder. We proceeded to remove the large clot burden from the bladder. Once this was complete we turned our attention to the bladder tumor. Using the bipolar resectoscope we removed the bladder tumor down to the base. A subsequent muscle deep biopsy was then taken. Hemostasis was then obtained with electrocautery. We then removed the bladder tumor chips and sent them for pathology. We then re-inspected the bladder and found no residula bleeding.  the bladder was then drained, a 22 French foley was placed and this concluded the procedure which was well tolerated by patient.  Complications: None  Condition: Stable, extubated, transferred to PACU  Plan: Patient is  admitted overnight with continuous bladder irrigation. If their urine is clear tomorrow they will be discharged home and followup in 5 days for foley catheter removal and pathology discussion.

## 2019-03-17 NOTE — Transfer of Care (Signed)
Immediate Anesthesia Transfer of Care Note  Patient: [name]  Procedure(s) Performed: Procedure(s): TRANSURETHRAL RESECTION OF BLADDER TUMOR (TURBT) (N/A)  Patient Location: PACU  Anesthesia Type:General  Level of Consciousness:  sedated, patient cooperative and responds to stimulation  Airway & Oxygen Therapy:Patient Spontanous Breathing and Patient connected to face mask oxgen  Post-op Assessment:  Report given to PACU RN and Post -op Vital signs reviewed and stable  Post vital signs:  Reviewed and stable  Last Vitals:  Vitals:   03/17/19 0917  BP: (!) 152/58  Pulse: (!) 57  Resp: 15  Temp: 36.4 C  SpO2: 38%    Complications: No apparent anesthesia complications

## 2019-03-17 NOTE — Progress Notes (Signed)
Audible wheezing, coughs to clear congestion.  Daughter Festus Holts says Pt ran out of lasix within past few days (currently no refill available, advised to call PCP) also that he has had confusion since last hospital visit.

## 2019-03-17 NOTE — Progress Notes (Signed)
Patient got to floor from PACU and RN and NT were unable to get an axillary or oral temperature. After multiple failed attempts, RN and NT checked Rectal Temperature which was 93.7. RN immediately covered patient in warm blankets and heat packs and paged MD on call. MD McKenzie called back and said as long the rest of the VS were within normal range and patient was ok to continue to put warm blankets on and heat packs and to continue to recheck temperature. Will continue to monitor patient for any further changes.  Timoteo Gaul, RN

## 2019-03-18 ENCOUNTER — Encounter (HOSPITAL_COMMUNITY): Payer: Self-pay | Admitting: Urology

## 2019-03-18 DIAGNOSIS — M1990 Unspecified osteoarthritis, unspecified site: Secondary | ICD-10-CM | POA: Diagnosis not present

## 2019-03-18 DIAGNOSIS — C673 Malignant neoplasm of anterior wall of bladder: Secondary | ICD-10-CM | POA: Diagnosis not present

## 2019-03-18 DIAGNOSIS — I130 Hypertensive heart and chronic kidney disease with heart failure and stage 1 through stage 4 chronic kidney disease, or unspecified chronic kidney disease: Secondary | ICD-10-CM | POA: Diagnosis not present

## 2019-03-18 DIAGNOSIS — Z87891 Personal history of nicotine dependence: Secondary | ICD-10-CM | POA: Diagnosis not present

## 2019-03-18 DIAGNOSIS — N183 Chronic kidney disease, stage 3 (moderate): Secondary | ICD-10-CM | POA: Diagnosis not present

## 2019-03-18 DIAGNOSIS — Z85038 Personal history of other malignant neoplasm of large intestine: Secondary | ICD-10-CM | POA: Diagnosis not present

## 2019-03-18 DIAGNOSIS — R310 Gross hematuria: Secondary | ICD-10-CM | POA: Diagnosis not present

## 2019-03-18 DIAGNOSIS — D494 Neoplasm of unspecified behavior of bladder: Secondary | ICD-10-CM | POA: Diagnosis not present

## 2019-03-18 DIAGNOSIS — I5032 Chronic diastolic (congestive) heart failure: Secondary | ICD-10-CM | POA: Diagnosis not present

## 2019-03-18 DIAGNOSIS — E1122 Type 2 diabetes mellitus with diabetic chronic kidney disease: Secondary | ICD-10-CM | POA: Diagnosis not present

## 2019-03-18 LAB — BASIC METABOLIC PANEL
Anion gap: 7 (ref 5–15)
BUN: 55 mg/dL — ABNORMAL HIGH (ref 8–23)
CO2: 25 mmol/L (ref 22–32)
Calcium: 8.5 mg/dL — ABNORMAL LOW (ref 8.9–10.3)
Chloride: 107 mmol/L (ref 98–111)
Creatinine, Ser: 2.69 mg/dL — ABNORMAL HIGH (ref 0.61–1.24)
GFR calc Af Amer: 23 mL/min — ABNORMAL LOW (ref >60)
GFR calc non Af Amer: 20 mL/min — ABNORMAL LOW (ref >60)
Glucose, Bld: 82 mg/dL (ref 70–99)
Potassium: 4.2 mmol/L (ref 3.5–5.1)
Sodium: 139 mmol/L (ref 135–145)

## 2019-03-18 LAB — CBC
HCT: 28.8 % — ABNORMAL LOW (ref 39.0–52.0)
Hemoglobin: 8.7 g/dL — ABNORMAL LOW (ref 13.0–17.0)
MCH: 26 pg (ref 26.0–34.0)
MCHC: 30.2 g/dL (ref 30.0–36.0)
MCV: 86 fL (ref 80.0–100.0)
Platelets: 122 10*3/uL — ABNORMAL LOW (ref 150–400)
RBC: 3.35 MIL/uL — ABNORMAL LOW (ref 4.22–5.81)
RDW: 16.1 % — ABNORMAL HIGH (ref 11.5–15.5)
WBC: 5.6 10*3/uL (ref 4.0–10.5)
nRBC: 0 % (ref 0.0–0.2)

## 2019-03-18 NOTE — Progress Notes (Signed)
1 Day Post-Op Subjective: Patient reports no pain. Urine pink on slow drip CBI  Objective: Vital signs in last 24 hours: Temp:  [94.2 F (34.6 C)-98.2 F (36.8 C)] 97.4 F (36.3 C) (05/27 1312) Pulse Rate:  [55-63] 57 (05/27 1312) Resp:  [14-20] 16 (05/27 1312) BP: (96-118)/(35-57) 100/42 (05/27 1312) SpO2:  [95 %-100 %] 97 % (05/27 1312)  Intake/Output from previous day: 05/26 0701 - 05/27 0700 In: 13734.1 [P.O.:750; I.V.:984.1] Out: 22000 [Urine:22000] Intake/Output this shift: Total I/O In: 151.2 [P.O.:60; I.V.:91.2] Out: 3900 [Urine:3900]  Physical Exam:  General:alert, cooperative and appears stated age GI: soft, non tender, normal bowel sounds, no palpable masses, no organomegaly, no inguinal hernia Male genitalia: not done Extremities: extremities normal, atraumatic, no cyanosis or edema  Lab Results: Recent Labs    03/17/19 1249 03/18/19 0544  HGB 9.6* 8.7*  HCT 30.0* 28.8*   BMET Recent Labs    03/17/19 1249 03/18/19 0544  NA 144 139  K 3.8 4.2  CL 111 107  CO2 26 25  GLUCOSE 97 82  BUN 53* 55*  CREATININE 2.60* 2.69*  CALCIUM 8.9 8.5*   No results for input(s): LABPT, INR in the last 72 hours. No results for input(s): LABURIN in the last 72 hours. Results for orders placed or performed during the hospital encounter of 03/13/19  Novel Coronavirus, NAA (hospital order; send-out to ref lab)     Status: None   Collection Time: 03/13/19 11:13 AM  Result Value Ref Range Status   SARS-CoV-2, NAA NOT DETECTED NOT DETECTED Final    Comment: (NOTE) Testing was performed using the cobas(R) SARS-CoV-2 test. This test was developed and its performance characteristics determined by [employer]. This test has not been FDA cleared or approved. This test has been authorized by FDA under an Emergency Use Authorization (EUA). This test is only authorized for the duration of time the declaration that circumstances exist justifying the authorization of  the emergency use of in vitro diagnostic tests for detection of SARS-CoV-2 virus and/or diagnosis of COVID-19 infection under section 564(b)(1) of the Act, 21 U.S.C. 378HYI-5(O)(2), unless the authorization is terminated or revoked sooner. When diagnostic testing is negative, the possibility of a false negative result should be considered in the context of a patient's recent exposures and the presence of clinical signs and symptoms consistent with COVID-19. An individual without symptoms of COVID-19 and who is not shedding SARS-CoV-2 virus would expect to have  a negative (not detected) result in this assay. Performed At: [facility] [address] [place], [place] [id number] Rush Farmer MD VE:[id number]    [name]  Final    Comment: Performed at [facility], [place] [address], [place], [place] [address]    Studies/Results: No results found.  Assessment/Plan: POD#1 TURBT  1. Wean CBI to off 2. Discharge tomorrow if urine is clear   LOS: 0 days   [name] 03/18/2019, 6:29 PM

## 2019-03-18 NOTE — Care Management Obs Status (Signed)
[facility] NOTIFICATION   Patient Details  Name: Caellum Mancil MRN: [id number] Date of Birth: [birth year]   Medicare Observation Status Notification Given:  Yes    Joaquin Courts, RN 03/18/2019, 12:46 PM

## 2019-03-19 DIAGNOSIS — Z87891 Personal history of nicotine dependence: Secondary | ICD-10-CM | POA: Diagnosis not present

## 2019-03-19 DIAGNOSIS — C673 Malignant neoplasm of anterior wall of bladder: Secondary | ICD-10-CM | POA: Diagnosis not present

## 2019-03-19 DIAGNOSIS — Z85038 Personal history of other malignant neoplasm of large intestine: Secondary | ICD-10-CM | POA: Diagnosis not present

## 2019-03-19 DIAGNOSIS — N183 Chronic kidney disease, stage 3 (moderate): Secondary | ICD-10-CM | POA: Diagnosis not present

## 2019-03-19 DIAGNOSIS — I130 Hypertensive heart and chronic kidney disease with heart failure and stage 1 through stage 4 chronic kidney disease, or unspecified chronic kidney disease: Secondary | ICD-10-CM | POA: Diagnosis not present

## 2019-03-19 DIAGNOSIS — I5032 Chronic diastolic (congestive) heart failure: Secondary | ICD-10-CM | POA: Diagnosis not present

## 2019-03-19 DIAGNOSIS — M1990 Unspecified osteoarthritis, unspecified site: Secondary | ICD-10-CM | POA: Diagnosis not present

## 2019-03-19 DIAGNOSIS — E1122 Type 2 diabetes mellitus with diabetic chronic kidney disease: Secondary | ICD-10-CM | POA: Diagnosis not present

## 2019-03-19 DIAGNOSIS — R310 Gross hematuria: Secondary | ICD-10-CM | POA: Diagnosis not present

## 2019-03-19 NOTE — Discharge Instructions (Signed)

## 2019-03-20 ENCOUNTER — Emergency Department (HOSPITAL_COMMUNITY)
Admission: EM | Admit: 2019-03-20 | Discharge: 2019-03-20 | Disposition: A | Discharge status: Home / Self Care | Payer: Medicare Other | Attending: Emergency Medicine | Admitting: Emergency Medicine

## 2019-03-20 ENCOUNTER — Other Ambulatory Visit: Payer: Self-pay

## 2019-03-20 DIAGNOSIS — Y733 Surgical instruments, materials and gastroenterology and urology devices (including sutures) associated with adverse incidents: Secondary | ICD-10-CM | POA: Diagnosis not present

## 2019-03-20 DIAGNOSIS — I130 Hypertensive heart and chronic kidney disease with heart failure and stage 1 through stage 4 chronic kidney disease, or unspecified chronic kidney disease: Secondary | ICD-10-CM | POA: Diagnosis not present

## 2019-03-20 DIAGNOSIS — E1122 Type 2 diabetes mellitus with diabetic chronic kidney disease: Secondary | ICD-10-CM | POA: Diagnosis not present

## 2019-03-20 DIAGNOSIS — T83021A Displacement of indwelling urethral catheter, initial encounter: Secondary | ICD-10-CM | POA: Diagnosis not present

## 2019-03-20 DIAGNOSIS — T839XXA Unspecified complication of genitourinary prosthetic device, implant and graft, initial encounter: Secondary | ICD-10-CM

## 2019-03-20 DIAGNOSIS — T83091A Other mechanical complication of indwelling urethral catheter, initial encounter: Secondary | ICD-10-CM | POA: Diagnosis not present

## 2019-03-20 DIAGNOSIS — Z79899 Other long term (current) drug therapy: Secondary | ICD-10-CM | POA: Diagnosis not present

## 2019-03-20 DIAGNOSIS — I5030 Unspecified diastolic (congestive) heart failure: Secondary | ICD-10-CM | POA: Diagnosis not present

## 2019-03-20 DIAGNOSIS — Z7982 Long term (current) use of aspirin: Secondary | ICD-10-CM | POA: Diagnosis not present

## 2019-03-20 DIAGNOSIS — Z8546 Personal history of malignant neoplasm of prostate: Secondary | ICD-10-CM | POA: Diagnosis not present

## 2019-03-20 DIAGNOSIS — Z85038 Personal history of other malignant neoplasm of large intestine: Secondary | ICD-10-CM | POA: Insufficient documentation

## 2019-03-20 DIAGNOSIS — Z466 Encounter for fitting and adjustment of urinary device: Secondary | ICD-10-CM | POA: Diagnosis present

## 2019-03-20 DIAGNOSIS — N183 Chronic kidney disease, stage 3 (moderate): Secondary | ICD-10-CM | POA: Diagnosis not present

## 2019-03-20 DIAGNOSIS — R0902 Hypoxemia: Secondary | ICD-10-CM | POA: Diagnosis not present

## 2019-03-20 DIAGNOSIS — Z87891 Personal history of nicotine dependence: Secondary | ICD-10-CM | POA: Diagnosis not present

## 2019-03-20 NOTE — ED Notes (Signed)
Pt transported to ED front entrance, via wheelchair, to meet daughter who was here to pick him up.  Pt assisted into car.  Daughter had no questions at this time.

## 2019-03-20 NOTE — Discharge Instructions (Addendum)
Follow-up with your urologist as scheduled.  Return to the ED for any new or concerning symptoms.

## 2019-03-20 NOTE — ED Provider Notes (Signed)
[facility] DEPT Provider Note   CSN: [id number] Arrival date & time: 03/20/19  0024    History   Chief Complaint Chief Complaint  Patient presents with  . cath pulled out    HPI [name] is a 85 y.o. male.     85 year old male with history of hypertension, DM, CKD, bladder tumor (3 days s/p transuretral resection by Dr. Alyson Ingles) presents to the ED after foley catheter came out.  Per EMS, patient was pulling down his pants and accidentally pulled out his indwelling Foley catheter.  The patient presently has no complaints of pain.  He is resting comfortably.  Not on chronic anticoagulation.      Past Medical History:  Diagnosis Date  . Adenomatous polyps 09/09/2008  . Anemia   . Aortic atherosclerosis ([place]) 01/2019  . Arthritis   . Bladder tumor   . Blood in urine   . Cardiomegaly 01/2019  . Carpal tunnel syndrome, bilateral 11/21/2016  . Chest pain on exertion   . Chronic kidney disease (CKD), stage III (moderate) (HCC)   . Colon cancer ([place]) dx'd 2008   surg only  . Confusion    with last hospital visit, april 2020  . DDD (degenerative disc disease), lumbar   . Diabetes mellitus without complication ([place])    Diet controlled  . Diastolic heart failure ([place])   . Dyspnea   . Falls frequently   . Hearing problem   . History of GI bleed   . Hypertension   . LBBB (left bundle branch block) 02/16/2019   noted on EKG  . Lipid disorder   . LVH (left ventricular hypertrophy) 03/03/2019    Moderate, Noted on ECHO  . Nerve pain   . Pleural effusion 01/2019   Right  . Pneumonia 01/2019  . Prostate cancer ([place]) dx'd 1993   surg only  . Thyroid cyst 09/2007   Bilateral  . Vertigo     Patient Active Problem List   Diagnosis Date Noted  . Bladder tumor 03/17/2019  . Chronic heart failure with preserved ejection fraction ([place]) 02/27/2019  . Pre-op evaluation 02/27/2019  . Hematuria 02/13/2019  . AKI (acute kidney injury) ([place])  02/13/2019  . CKD stage G3b/A2, GFR 30-44 and albumin creatinine ratio 30-299 mg/g (HCC) 02/13/2019  . Diastolic CHF ([place]) 62/95/2841  . Acute CHF (congestive heart failure) ([place]) 12/02/2017  . Hypothermia 12/02/2017  . Dyspnea 12/02/2017  . CHF (congestive heart failure) ([place]) 12/02/2017  . Carpal tunnel syndrome, bilateral 11/21/2016  . Falls frequently   . Vertigo 05/20/2014  . PROSTATE CANCER 08/19/2008  . DM 08/19/2008  . HYPERLIPIDEMIA 08/19/2008  . Essential hypertension 08/19/2008  . Personal history of colon cancer, stage II 08/19/2008    Past Surgical History:  Procedure Laterality Date  . CATARACT EXTRACTION W/PHACO Left 06/10/2013   Procedure: CATARACT EXTRACTION PHACO AND INTRAOCULAR LENS PLACEMENT (IOC);  Surgeon: Adonis Brook, MD;  Location: [facility];  Service: Ophthalmology;  Laterality: Left;  . COLONOSCOPY    . ESOPHAGOGASTRODUODENOSCOPY    . EYE SURGERY     cataract surgery, right eye  . laparoscopic assisted right hemicolectomy  09/21/07  . PROSTATECTOMY    . TRANSURETHRAL RESECTION OF BLADDER TUMOR N/A 03/17/2019   Procedure: TRANSURETHRAL RESECTION OF BLADDER TUMOR (TURBT);  Surgeon: Cleon Gustin, MD;  Location: [facility];  Service: Urology;  Laterality: N/A;        Home Medications    Prior to Admission medications   Medication Sig  Start Date End Date Taking? Authorizing Provider  amLODipine (NORVASC) 10 MG tablet Take 10 mg by mouth daily.    [provider]  aspirin EC 81 MG tablet Take 81 mg by mouth daily.    [provider]  Blood Glucose Monitoring Suppl (ONE TOUCH ULTRA 2) w/Device KIT USE TO CHECK GLUCOSE AS DIRECTED ONCE DAILY 01/15/19   [provider]  citalopram (CELEXA) 10 MG tablet Take 10 mg by mouth daily. 11/24/17   [provider]  ENTRESTO 24-26 MG Take 1 tablet by mouth 2 (two) times daily. 11/17/18   [provider]  furosemide (LASIX) 40 MG tablet Take 40 mg by mouth daily.    [provider]  ipratropium-albuterol (DUONEB) 0.5-2.5 (3) MG/3ML SOLN Take 3 mLs by nebulization every 6 (six) hours as needed. Patient taking differently: Take 3 mLs by nebulization every 6 (six) hours as needed (shortness of breath).  02/17/19   Sheikh, Omair Latif, DO  ONE TOUCH ULTRA TEST test strip USE 1 STRIP TO CHECK GLUCOSE TWICE DAILY 01/15/19   [provider]  oxybutynin (DITROPAN) 5 MG tablet Take 2.5 mg by mouth 3 (three) times daily.    [provider]  polyethylene glycol (MIRALAX / GLYCOLAX) 17 g packet Take 17 g by mouth daily as needed for mild constipation.    [provider]  tamsulosin (FLOMAX) 0.4 MG CAPS capsule Take 1 capsule (0.4 mg total) by mouth daily. 02/18/19   Kerney Elbe, DO    Family History Family History  Problem Relation Age of Onset  . Diabetes Other   . Hypertension Mother   . Stroke Mother     Social History Social History   Tobacco Use  . Smoking status: Former Smoker    Years: 1.00    Types: Cigarettes    Last attempt to quit: 06/10/1983    Years since quitting: 35.8  . Smokeless tobacco: Never Used  Substance Use Topics  . Alcohol use: Not Currently  . Drug use: No     Allergies   Patient has no known allergies.   Review of Systems Review of Systems Ten systems reviewed and are negative for acute change, except as noted in the HPI.    Physical Exam Updated Vital Signs BP (!) 150/80   Pulse 62   Temp 97.9 F (36.6 C) (Oral)   Resp 17   SpO2 96%   Physical Exam Vitals signs and nursing note reviewed.  Constitutional:      General: He is not in acute distress.    Appearance: He is well-developed. He is not diaphoretic.     Comments: Patient in NAD  HENT:     Head: Normocephalic and atraumatic.  Eyes:     General: No scleral icterus.    Conjunctiva/sclera: Conjunctivae normal.  Neck:     Musculoskeletal: Normal range of motion.  Pulmonary:     Effort: Pulmonary effort is normal. No  respiratory distress.     Comments: Respirations even and unlabored Genitourinary:    Comments: Uncircumcised penis with small blood clot at the urethral meatus. No evidence of significant trauma. Musculoskeletal: Normal range of motion.  Skin:    General: Skin is warm and dry.     Coloration: Skin is not pale.     Findings: No erythema or rash.  Neurological:     Mental Status: He is alert.     Comments: Moving all extremities spontaneously.  Psychiatric:  Behavior: Behavior normal.      ED Treatments / Results  Labs (all labs ordered are listed, but only abnormal results are displayed) Labs Reviewed - No data to display  EKG None  Radiology No results found.  Procedures Procedures (including critical care time)  Medications Ordered in ED Medications - No data to display   1:29 AM Attempted to contact the patient's daughter, Mate Rota, without answer.   Initial Impression / Assessment and Plan / ED Course  I have reviewed the triage vital signs and the nursing notes.  Pertinent labs & imaging results that were available during my care of the patient were reviewed by me and considered in my medical decision making (see chart for details).        85 year old male s/p transurethral bladder tumor resection presents to the emergency department for evaluation after he accidentally pulled his Foley catheter out while pulling down his pants tonight.  Foley was placed in the emergency department without difficulty.  There was gross hematuria on initial Foley placement.  Trauma from displacement of initial foley likely, at least in part, causing hematuria tonight.  No use of chronic anticoagulation.  Patient with no additional complaints or pain.  He is to follow-up with urology as scheduled.  Return precautions discussed and provided. Patient discharged in stable condition with no unaddressed concerns.  Vitals:   03/20/19 0036 03/20/19 0214 03/20/19 0330  BP:  (!) 142/61 (!) 156/67 (!) 150/80  Pulse: 75 70 62  Resp: '20 18 17  '$ Temp: 97.9 F (36.6 C)    TempSrc: Oral    SpO2: 94% 99% 96%    Final Clinical Impressions(s) / ED Diagnoses   Final diagnoses:  Problem with Foley catheter, initial encounter [facility])    ED Discharge Orders    None       Antonietta Breach, PA-C 03/20/19 0411    Fatima Blank, MD 03/24/19 2330

## 2019-03-20 NOTE — ED Notes (Signed)
Charge Rn spoke with sister and she is picking pt up

## 2019-03-20 NOTE — ED Notes (Signed)
Bed: WA17 Expected date:  Expected time:  Means of arrival:  Comments: EMS pulled catheter out

## 2019-03-20 NOTE — ED Notes (Signed)
Changed drainage bag to leg bag.

## 2019-03-20 NOTE — ED Triage Notes (Signed)
Per EMS - Pt had tumor removed form bladder 2 days ago here at [facility]. While at home this evening pt attempted to pull down pants and pulled indwelling cath out.   150 100 HR R 18 CBG 154 98.9

## 2019-03-21 DIAGNOSIS — B964 Proteus (mirabilis) (morganii) as the cause of diseases classified elsewhere: Secondary | ICD-10-CM | POA: Diagnosis not present

## 2019-03-21 DIAGNOSIS — M1990 Unspecified osteoarthritis, unspecified site: Secondary | ICD-10-CM | POA: Diagnosis not present

## 2019-03-21 DIAGNOSIS — Z9181 History of falling: Secondary | ICD-10-CM | POA: Diagnosis not present

## 2019-03-21 DIAGNOSIS — Z7982 Long term (current) use of aspirin: Secondary | ICD-10-CM | POA: Diagnosis not present

## 2019-03-21 DIAGNOSIS — Z85038 Personal history of other malignant neoplasm of large intestine: Secondary | ICD-10-CM | POA: Diagnosis not present

## 2019-03-21 DIAGNOSIS — N183 Chronic kidney disease, stage 3 (moderate): Secondary | ICD-10-CM | POA: Diagnosis not present

## 2019-03-21 DIAGNOSIS — G5603 Carpal tunnel syndrome, bilateral upper limbs: Secondary | ICD-10-CM | POA: Diagnosis not present

## 2019-03-21 DIAGNOSIS — I5033 Acute on chronic diastolic (congestive) heart failure: Secondary | ICD-10-CM | POA: Diagnosis not present

## 2019-03-21 DIAGNOSIS — M6281 Muscle weakness (generalized): Secondary | ICD-10-CM | POA: Diagnosis not present

## 2019-03-21 DIAGNOSIS — J189 Pneumonia, unspecified organism: Secondary | ICD-10-CM | POA: Diagnosis not present

## 2019-03-21 DIAGNOSIS — N390 Urinary tract infection, site not specified: Secondary | ICD-10-CM | POA: Diagnosis not present

## 2019-03-21 DIAGNOSIS — E1122 Type 2 diabetes mellitus with diabetic chronic kidney disease: Secondary | ICD-10-CM | POA: Diagnosis not present

## 2019-03-21 DIAGNOSIS — N130 Hydronephrosis with ureteropelvic junction obstruction: Secondary | ICD-10-CM | POA: Diagnosis not present

## 2019-03-21 DIAGNOSIS — I130 Hypertensive heart and chronic kidney disease with heart failure and stage 1 through stage 4 chronic kidney disease, or unspecified chronic kidney disease: Secondary | ICD-10-CM | POA: Diagnosis not present

## 2019-03-21 DIAGNOSIS — I509 Heart failure, unspecified: Secondary | ICD-10-CM | POA: Diagnosis not present

## 2019-03-21 DIAGNOSIS — Z87891 Personal history of nicotine dependence: Secondary | ICD-10-CM | POA: Diagnosis not present

## 2019-03-21 DIAGNOSIS — Z792 Long term (current) use of antibiotics: Secondary | ICD-10-CM | POA: Diagnosis not present

## 2019-03-23 DIAGNOSIS — J449 Chronic obstructive pulmonary disease, unspecified: Secondary | ICD-10-CM | POA: Diagnosis not present

## 2019-03-23 DIAGNOSIS — I509 Heart failure, unspecified: Secondary | ICD-10-CM | POA: Diagnosis not present

## 2019-03-23 DIAGNOSIS — Z85038 Personal history of other malignant neoplasm of large intestine: Secondary | ICD-10-CM | POA: Diagnosis not present

## 2019-03-23 DIAGNOSIS — M1990 Unspecified osteoarthritis, unspecified site: Secondary | ICD-10-CM | POA: Diagnosis not present

## 2019-03-23 DIAGNOSIS — Z792 Long term (current) use of antibiotics: Secondary | ICD-10-CM | POA: Diagnosis not present

## 2019-03-23 DIAGNOSIS — G5603 Carpal tunnel syndrome, bilateral upper limbs: Secondary | ICD-10-CM | POA: Diagnosis not present

## 2019-03-23 DIAGNOSIS — N130 Hydronephrosis with ureteropelvic junction obstruction: Secondary | ICD-10-CM | POA: Diagnosis not present

## 2019-03-23 DIAGNOSIS — Z7982 Long term (current) use of aspirin: Secondary | ICD-10-CM | POA: Diagnosis not present

## 2019-03-23 DIAGNOSIS — Z9181 History of falling: Secondary | ICD-10-CM | POA: Diagnosis not present

## 2019-03-23 DIAGNOSIS — N390 Urinary tract infection, site not specified: Secondary | ICD-10-CM | POA: Diagnosis not present

## 2019-03-23 DIAGNOSIS — N183 Chronic kidney disease, stage 3 (moderate): Secondary | ICD-10-CM | POA: Diagnosis not present

## 2019-03-23 DIAGNOSIS — J189 Pneumonia, unspecified organism: Secondary | ICD-10-CM | POA: Diagnosis not present

## 2019-03-23 DIAGNOSIS — I5033 Acute on chronic diastolic (congestive) heart failure: Secondary | ICD-10-CM | POA: Diagnosis not present

## 2019-03-23 DIAGNOSIS — E1122 Type 2 diabetes mellitus with diabetic chronic kidney disease: Secondary | ICD-10-CM | POA: Diagnosis not present

## 2019-03-23 DIAGNOSIS — Z87891 Personal history of nicotine dependence: Secondary | ICD-10-CM | POA: Diagnosis not present

## 2019-03-23 DIAGNOSIS — B964 Proteus (mirabilis) (morganii) as the cause of diseases classified elsewhere: Secondary | ICD-10-CM | POA: Diagnosis not present

## 2019-03-23 DIAGNOSIS — M6281 Muscle weakness (generalized): Secondary | ICD-10-CM | POA: Diagnosis not present

## 2019-03-23 DIAGNOSIS — I130 Hypertensive heart and chronic kidney disease with heart failure and stage 1 through stage 4 chronic kidney disease, or unspecified chronic kidney disease: Secondary | ICD-10-CM | POA: Diagnosis not present

## 2019-03-24 DIAGNOSIS — Z87891 Personal history of nicotine dependence: Secondary | ICD-10-CM | POA: Diagnosis not present

## 2019-03-24 DIAGNOSIS — Z792 Long term (current) use of antibiotics: Secondary | ICD-10-CM | POA: Diagnosis not present

## 2019-03-24 DIAGNOSIS — I5033 Acute on chronic diastolic (congestive) heart failure: Secondary | ICD-10-CM | POA: Diagnosis not present

## 2019-03-24 DIAGNOSIS — Z466 Encounter for fitting and adjustment of urinary device: Secondary | ICD-10-CM | POA: Diagnosis not present

## 2019-03-24 DIAGNOSIS — R262 Difficulty in walking, not elsewhere classified: Secondary | ICD-10-CM | POA: Diagnosis not present

## 2019-03-24 DIAGNOSIS — I130 Hypertensive heart and chronic kidney disease with heart failure and stage 1 through stage 4 chronic kidney disease, or unspecified chronic kidney disease: Secondary | ICD-10-CM | POA: Diagnosis not present

## 2019-03-24 DIAGNOSIS — Z9181 History of falling: Secondary | ICD-10-CM | POA: Diagnosis not present

## 2019-03-24 DIAGNOSIS — G5603 Carpal tunnel syndrome, bilateral upper limbs: Secondary | ICD-10-CM | POA: Diagnosis not present

## 2019-03-24 DIAGNOSIS — N183 Chronic kidney disease, stage 3 (moderate): Secondary | ICD-10-CM | POA: Diagnosis not present

## 2019-03-24 DIAGNOSIS — Z7982 Long term (current) use of aspirin: Secondary | ICD-10-CM | POA: Diagnosis not present

## 2019-03-24 DIAGNOSIS — N130 Hydronephrosis with ureteropelvic junction obstruction: Secondary | ICD-10-CM | POA: Diagnosis not present

## 2019-03-24 DIAGNOSIS — E1122 Type 2 diabetes mellitus with diabetic chronic kidney disease: Secondary | ICD-10-CM | POA: Diagnosis not present

## 2019-03-24 DIAGNOSIS — Z87440 Personal history of urinary (tract) infections: Secondary | ICD-10-CM | POA: Diagnosis not present

## 2019-03-24 DIAGNOSIS — Z85038 Personal history of other malignant neoplasm of large intestine: Secondary | ICD-10-CM | POA: Diagnosis not present

## 2019-03-25 NOTE — Discharge Summary (Signed)
Physician Discharge Summary  Patient ID: [name] MRN: [id number] DOB/AGE: 1924-09-13 85 y.o.  Admit date: 03/17/2019 Discharge date: 03/19/2019  Admission Diagnoses: Bladder tumor Discharge Diagnoses:  Active Problems:   Bladder tumor   Discharged Condition: good  Hospital Course: The patient tolerated the procedure well and was transferred to the floor on IV pain meds, IV fluid. On POD#1 pt was started on regular diet and CBI was decreased. His urine cleared on POD#2. On POD#2 IVFs were discontinued, and the patient passed flatus. Prior to discharge the pt was tolerating a regular diet, pain was controlled on PO pain meds, and they had normal bowel function.  Consults: None  Significant Diagnostic Studies: none  Treatments: surgery: Bladder tumor resection  Discharge Exam: Blood pressure (!) 129/54, pulse 65, temperature 98 F (36.7 C), resp. rate 20, height '5\' 4"'$  (1.626 m), weight 85 kg, SpO2 94 %. General appearance: alert, cooperative and appears stated age Head: Normocephalic, without obvious abnormality, atraumatic Nose: Nares normal. Septum midline. Mucosa normal. No drainage or sinus tenderness. Neck: no adenopathy, no carotid bruit, no JVD, supple, symmetrical, trachea midline and thyroid not enlarged, symmetric, no tenderness/mass/nodules Resp: clear to auscultation bilaterally Cardio: regular rate and rhythm, S1, S2 normal, no murmur, click, rub or gallop GI: soft, non-tender; bowel sounds normal; no masses,  no organomegaly Extremities: extremities normal, atraumatic, no cyanosis or edema  Disposition:    Allergies as of 03/19/2019   No Known Allergies     Medication List    TAKE these medications   amLODipine 10 MG tablet Commonly known as:  NORVASC Take 10 mg by mouth daily.   aspirin EC 81 MG tablet Take 81 mg by mouth daily.   citalopram 10 MG tablet Commonly known as:  CELEXA Take 10 mg by mouth daily.   Entresto 24-26 MG Generic drug:   sacubitril-valsartan Take 1 tablet by mouth 2 (two) times daily.   furosemide 40 MG tablet Commonly known as:  LASIX Take 40 mg by mouth daily.   ipratropium-albuterol 0.5-2.5 (3) MG/3ML Soln Commonly known as:  DUONEB Take 3 mLs by nebulization every 6 (six) hours as needed. What changed:  reasons to take this   ONE TOUCH ULTRA 2 w/Device Kit USE TO CHECK GLUCOSE AS DIRECTED ONCE DAILY   ONE TOUCH ULTRA TEST test strip Generic drug:  glucose blood USE 1 STRIP TO CHECK GLUCOSE TWICE DAILY   oxybutynin 5 MG tablet Commonly known as:  DITROPAN Take 2.5 mg by mouth 3 (three) times daily.   polyethylene glycol 17 g packet Commonly known as:  MIRALAX / GLYCOLAX Take 17 g by mouth daily as needed for mild constipation.   tamsulosin 0.4 MG Caps capsule Commonly known as:  FLOMAX Take 1 capsule (0.4 mg total) by mouth daily.      Follow-up Information    Karen Kays, NP. Call in 1 week(s).   Specialty:  Nurse Practitioner Contact information: [address] [place] [address] [phone]           Signed: Nicolette Bang 03/25/2019, 9:45 PM

## 2019-03-27 DIAGNOSIS — C671 Malignant neoplasm of dome of bladder: Secondary | ICD-10-CM | POA: Diagnosis not present

## 2019-03-27 DIAGNOSIS — C673 Malignant neoplasm of anterior wall of bladder: Secondary | ICD-10-CM | POA: Diagnosis not present

## 2019-04-01 DIAGNOSIS — Z9181 History of falling: Secondary | ICD-10-CM | POA: Diagnosis not present

## 2019-04-01 DIAGNOSIS — R262 Difficulty in walking, not elsewhere classified: Secondary | ICD-10-CM | POA: Diagnosis not present

## 2019-04-01 DIAGNOSIS — Z7982 Long term (current) use of aspirin: Secondary | ICD-10-CM | POA: Diagnosis not present

## 2019-04-01 DIAGNOSIS — Z85038 Personal history of other malignant neoplasm of large intestine: Secondary | ICD-10-CM | POA: Diagnosis not present

## 2019-04-01 DIAGNOSIS — I5033 Acute on chronic diastolic (congestive) heart failure: Secondary | ICD-10-CM | POA: Diagnosis not present

## 2019-04-01 DIAGNOSIS — Z792 Long term (current) use of antibiotics: Secondary | ICD-10-CM | POA: Diagnosis not present

## 2019-04-01 DIAGNOSIS — Z87440 Personal history of urinary (tract) infections: Secondary | ICD-10-CM | POA: Diagnosis not present

## 2019-04-01 DIAGNOSIS — N183 Chronic kidney disease, stage 3 (moderate): Secondary | ICD-10-CM | POA: Diagnosis not present

## 2019-04-01 DIAGNOSIS — I130 Hypertensive heart and chronic kidney disease with heart failure and stage 1 through stage 4 chronic kidney disease, or unspecified chronic kidney disease: Secondary | ICD-10-CM | POA: Diagnosis not present

## 2019-04-01 DIAGNOSIS — N130 Hydronephrosis with ureteropelvic junction obstruction: Secondary | ICD-10-CM | POA: Diagnosis not present

## 2019-04-01 DIAGNOSIS — Z466 Encounter for fitting and adjustment of urinary device: Secondary | ICD-10-CM | POA: Diagnosis not present

## 2019-04-01 DIAGNOSIS — Z87891 Personal history of nicotine dependence: Secondary | ICD-10-CM | POA: Diagnosis not present

## 2019-04-01 DIAGNOSIS — E1122 Type 2 diabetes mellitus with diabetic chronic kidney disease: Secondary | ICD-10-CM | POA: Diagnosis not present

## 2019-04-01 DIAGNOSIS — R311 Benign essential microscopic hematuria: Secondary | ICD-10-CM | POA: Diagnosis not present

## 2019-04-01 DIAGNOSIS — R310 Gross hematuria: Secondary | ICD-10-CM | POA: Diagnosis not present

## 2019-04-01 DIAGNOSIS — G5603 Carpal tunnel syndrome, bilateral upper limbs: Secondary | ICD-10-CM | POA: Diagnosis not present

## 2019-04-02 DIAGNOSIS — Z87440 Personal history of urinary (tract) infections: Secondary | ICD-10-CM | POA: Diagnosis not present

## 2019-04-02 DIAGNOSIS — Z7982 Long term (current) use of aspirin: Secondary | ICD-10-CM | POA: Diagnosis not present

## 2019-04-02 DIAGNOSIS — I5033 Acute on chronic diastolic (congestive) heart failure: Secondary | ICD-10-CM | POA: Diagnosis not present

## 2019-04-02 DIAGNOSIS — I130 Hypertensive heart and chronic kidney disease with heart failure and stage 1 through stage 4 chronic kidney disease, or unspecified chronic kidney disease: Secondary | ICD-10-CM | POA: Diagnosis not present

## 2019-04-02 DIAGNOSIS — R262 Difficulty in walking, not elsewhere classified: Secondary | ICD-10-CM | POA: Diagnosis not present

## 2019-04-02 DIAGNOSIS — N183 Chronic kidney disease, stage 3 (moderate): Secondary | ICD-10-CM | POA: Diagnosis not present

## 2019-04-02 DIAGNOSIS — N130 Hydronephrosis with ureteropelvic junction obstruction: Secondary | ICD-10-CM | POA: Diagnosis not present

## 2019-04-02 DIAGNOSIS — Z792 Long term (current) use of antibiotics: Secondary | ICD-10-CM | POA: Diagnosis not present

## 2019-04-02 DIAGNOSIS — Z466 Encounter for fitting and adjustment of urinary device: Secondary | ICD-10-CM | POA: Diagnosis not present

## 2019-04-02 DIAGNOSIS — Z87891 Personal history of nicotine dependence: Secondary | ICD-10-CM | POA: Diagnosis not present

## 2019-04-02 DIAGNOSIS — Z85038 Personal history of other malignant neoplasm of large intestine: Secondary | ICD-10-CM | POA: Diagnosis not present

## 2019-04-02 DIAGNOSIS — G5603 Carpal tunnel syndrome, bilateral upper limbs: Secondary | ICD-10-CM | POA: Diagnosis not present

## 2019-04-02 DIAGNOSIS — Z9181 History of falling: Secondary | ICD-10-CM | POA: Diagnosis not present

## 2019-04-02 DIAGNOSIS — E1122 Type 2 diabetes mellitus with diabetic chronic kidney disease: Secondary | ICD-10-CM | POA: Diagnosis not present

## 2019-04-03 DIAGNOSIS — C671 Malignant neoplasm of dome of bladder: Secondary | ICD-10-CM | POA: Diagnosis not present

## 2019-04-03 DIAGNOSIS — C673 Malignant neoplasm of anterior wall of bladder: Secondary | ICD-10-CM | POA: Diagnosis not present

## 2019-04-06 ENCOUNTER — Telehealth: Payer: Self-pay | Admitting: Oncology

## 2019-04-06 DIAGNOSIS — Z87891 Personal history of nicotine dependence: Secondary | ICD-10-CM | POA: Diagnosis not present

## 2019-04-06 DIAGNOSIS — E1122 Type 2 diabetes mellitus with diabetic chronic kidney disease: Secondary | ICD-10-CM | POA: Diagnosis not present

## 2019-04-06 DIAGNOSIS — Z7982 Long term (current) use of aspirin: Secondary | ICD-10-CM | POA: Diagnosis not present

## 2019-04-06 DIAGNOSIS — Z85038 Personal history of other malignant neoplasm of large intestine: Secondary | ICD-10-CM | POA: Diagnosis not present

## 2019-04-06 DIAGNOSIS — N183 Chronic kidney disease, stage 3 (moderate): Secondary | ICD-10-CM | POA: Diagnosis not present

## 2019-04-06 DIAGNOSIS — N130 Hydronephrosis with ureteropelvic junction obstruction: Secondary | ICD-10-CM | POA: Diagnosis not present

## 2019-04-06 DIAGNOSIS — G5603 Carpal tunnel syndrome, bilateral upper limbs: Secondary | ICD-10-CM | POA: Diagnosis not present

## 2019-04-06 DIAGNOSIS — Z9181 History of falling: Secondary | ICD-10-CM | POA: Diagnosis not present

## 2019-04-06 DIAGNOSIS — Z466 Encounter for fitting and adjustment of urinary device: Secondary | ICD-10-CM | POA: Diagnosis not present

## 2019-04-06 DIAGNOSIS — Z792 Long term (current) use of antibiotics: Secondary | ICD-10-CM | POA: Diagnosis not present

## 2019-04-06 DIAGNOSIS — I5033 Acute on chronic diastolic (congestive) heart failure: Secondary | ICD-10-CM | POA: Diagnosis not present

## 2019-04-06 DIAGNOSIS — R262 Difficulty in walking, not elsewhere classified: Secondary | ICD-10-CM | POA: Diagnosis not present

## 2019-04-06 DIAGNOSIS — I130 Hypertensive heart and chronic kidney disease with heart failure and stage 1 through stage 4 chronic kidney disease, or unspecified chronic kidney disease: Secondary | ICD-10-CM | POA: Diagnosis not present

## 2019-04-06 DIAGNOSIS — Z87440 Personal history of urinary (tract) infections: Secondary | ICD-10-CM | POA: Diagnosis not present

## 2019-04-06 NOTE — Telephone Encounter (Signed)
Called patient regarding upcoming [facility] appointment, patient's daughter requested this to be a telephone visit.

## 2019-04-07 ENCOUNTER — Inpatient Hospital Stay: Payer: Medicare Other | Attending: Oncology | Admitting: Oncology

## 2019-04-07 DIAGNOSIS — C61 Malignant neoplasm of prostate: Secondary | ICD-10-CM | POA: Diagnosis not present

## 2019-04-07 NOTE — Progress Notes (Signed)
Hematology and Oncology Follow Up for Telemedicine Visits  [name] [id number] 1924/01/08 85 y.o. 04/07/2019 11:14 AM [name], MDBland, Robert Rude, MD   I connected with Mr. [name] on 04/07/19 at 11:30 AM EDT by telephone visit and verified that I am speaking with the correct person using two identifiers.   I discussed the limitations, risks, security and privacy concerns of performing an evaluation and management service by telemedicine and the availability of in-person appointments. I also discussed with the patient that there may be a patient responsible charge related to this service. The patient expressed understanding and agreed to proceed.  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  He was found to have prostate cancer in 2008.   Prior Therapy:  He received radiation therapy as a definitive modality at the time of diagnosis. He developed recurrent disease treated with androgen deprivation therapy alone and subsequently Gillermina Phy was added on May 20, 2016. His PSA dropped from originally 4 down to 3 at that time. His PSA continues to be intermittently between 3 and 4 between 2017 and 2019. He has been intermittently taking Xtandi in the interim. His most recent PSA in September 2019 was up to 12.2.   Current therapy: Androgen deprivation therapy alone.  Interim History: Mr. [name] was referred back again by Dr. Alyson Ingles after a rise in his PSA up to 4 in December 2019.  His PSA has been around 4 in June 2019 and up to 12 in September 2019.  He was also hospitalized in the end of May and underwent transurethral resection of a bladder tumor that was superficial although muscle invasion is suspected.  Clinically, he continues to be quite debilitated with very limited  performance status.  He spends the majority of his day in bed and ambulating very little.  He denies any worsening pain including abdominal discomfort or flank pain.  His appetite remains marginal.     Medications: I have reviewed the patient's current medications.  Current Outpatient Medications  Medication Sig Dispense Refill  . amLODipine (NORVASC) 10 MG tablet Take 10 mg by mouth daily.    Marland Kitchen aspirin EC 81 MG tablet Take 81 mg by mouth daily.    . Blood Glucose Monitoring Suppl (ONE TOUCH ULTRA 2) w/Device KIT USE TO CHECK GLUCOSE AS DIRECTED ONCE DAILY    . citalopram (CELEXA) 10 MG tablet Take 10 mg by mouth daily.    Marland Kitchen ENTRESTO 24-26 MG Take 1 tablet by mouth 2 (two) times daily.    . furosemide (LASIX) 40 MG tablet Take 40 mg by mouth daily.    Marland Kitchen ipratropium-albuterol (DUONEB) 0.5-2.5 (3) MG/3ML SOLN Take 3 mLs by nebulization every 6 (six) hours as needed. (Patient taking differently: Take 3 mLs by nebulization every 6 (six) hours as needed (shortness of breath). ) 360 mL 0  . ONE TOUCH ULTRA TEST test strip USE 1 STRIP TO CHECK GLUCOSE TWICE DAILY    . oxybutynin (DITROPAN) 5 MG tablet Take 2.5 mg by mouth 3 (three) times daily.    . polyethylene glycol (MIRALAX / GLYCOLAX) 17 g packet Take 17 g by mouth daily as needed for mild constipation.    . tamsulosin (FLOMAX) 0.4 MG CAPS capsule Take 1 capsule (0.4 mg total) by mouth daily. 30 capsule 0   No current facility-administered medications  for this visit.      Allergies: No Known Allergies     Lab Results: Lab Results  Component Value Date   WBC 5.6 03/18/2019   HGB 8.7 (L) 03/18/2019   HCT 28.8 (L) 03/18/2019   MCV 86.0 03/18/2019   PLT 122 (L) 03/18/2019     Chemistry      Component Value Date/Time   NA 139 03/18/2019 0544   NA 144 10/28/2013 1059   K 4.2 03/18/2019 0544   K 3.6 10/28/2013 1059   CL 107 03/18/2019 0544   CL 103 10/24/2012 1017   CO2 25 03/18/2019 0544   CO2 29 10/28/2013 1059   BUN 55 (H)  03/18/2019 0544   BUN 44.9 (H) 10/28/2013 1059   CREATININE 2.69 (H) 03/18/2019 0544   CREATININE 2.31 (H) 08/06/2018 1104   CREATININE 1.9 (H) 10/28/2013 1059      Component Value Date/Time   CALCIUM 8.5 (L) 03/18/2019 0544   CALCIUM 10.2 10/28/2013 1059   ALKPHOS 77 02/17/2019 0418   ALKPHOS 69 10/28/2013 1059   AST 16 02/17/2019 0418   AST 18 08/06/2018 1104   AST 14 10/28/2013 1059   ALT 12 02/17/2019 0418   ALT 8 08/06/2018 1104   ALT 9 10/28/2013 1059   BILITOT 0.3 02/17/2019 0418   BILITOT 0.5 08/06/2018 1104   BILITOT 0.73 10/28/2013 1059       Impression and Plan:  85 year old man with:  1. 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  He has progressed on Xtandi and his recent PSA is up to 53.1.  The natural course of this disease was reviewed today with the patient and his daughters that accompanied him on his virtual visit.  He is not a candidate for any additional therapy at this time.  I think he would be a poor candidate for Zytiga which likely will not be effective and certainly not a candidate for systemic chemotherapy.  I recommended continuing supportive care only with consideration of hospice if his condition continues to decline.  2. Prognosis: This was discussed today with the patient and his family.  I anticipate limited life expectancy of less than a year potentially of 6 months.  He would be a reasonable hospice candidate if he continues on this trajectory.  3. Follow-up: I am happy to assist in his care anytime needed in the future.       I discussed the assessment and treatment plan with the patient. The patient was provided an opportunity to ask questions and all were answered. The patient agreed with the plan and demonstrated an understanding of the instructions.   The patient was advised to call back or seek an in-person evaluation if the symptoms worsen or if the condition fails to improve as anticipated.  I provided 25  minutes of face-to-face video visit time during this encounter, and > 50% was dedicated to reviewing his disease status, laboratory data and answering questions regarding future plan of care.  Zola Button, MD 04/07/2019 11:14 AM

## 2019-04-08 ENCOUNTER — Telehealth: Payer: Self-pay | Admitting: Oncology

## 2019-04-08 DIAGNOSIS — N130 Hydronephrosis with ureteropelvic junction obstruction: Secondary | ICD-10-CM | POA: Diagnosis not present

## 2019-04-08 DIAGNOSIS — Z7982 Long term (current) use of aspirin: Secondary | ICD-10-CM | POA: Diagnosis not present

## 2019-04-08 DIAGNOSIS — Z466 Encounter for fitting and adjustment of urinary device: Secondary | ICD-10-CM | POA: Diagnosis not present

## 2019-04-08 DIAGNOSIS — Z9181 History of falling: Secondary | ICD-10-CM | POA: Diagnosis not present

## 2019-04-08 DIAGNOSIS — Z85038 Personal history of other malignant neoplasm of large intestine: Secondary | ICD-10-CM | POA: Diagnosis not present

## 2019-04-08 DIAGNOSIS — Z792 Long term (current) use of antibiotics: Secondary | ICD-10-CM | POA: Diagnosis not present

## 2019-04-08 DIAGNOSIS — I5033 Acute on chronic diastolic (congestive) heart failure: Secondary | ICD-10-CM | POA: Diagnosis not present

## 2019-04-08 DIAGNOSIS — E1122 Type 2 diabetes mellitus with diabetic chronic kidney disease: Secondary | ICD-10-CM | POA: Diagnosis not present

## 2019-04-08 DIAGNOSIS — Z87891 Personal history of nicotine dependence: Secondary | ICD-10-CM | POA: Diagnosis not present

## 2019-04-08 DIAGNOSIS — I130 Hypertensive heart and chronic kidney disease with heart failure and stage 1 through stage 4 chronic kidney disease, or unspecified chronic kidney disease: Secondary | ICD-10-CM | POA: Diagnosis not present

## 2019-04-08 DIAGNOSIS — Z87440 Personal history of urinary (tract) infections: Secondary | ICD-10-CM | POA: Diagnosis not present

## 2019-04-08 DIAGNOSIS — N183 Chronic kidney disease, stage 3 (moderate): Secondary | ICD-10-CM | POA: Diagnosis not present

## 2019-04-08 DIAGNOSIS — R262 Difficulty in walking, not elsewhere classified: Secondary | ICD-10-CM | POA: Diagnosis not present

## 2019-04-08 DIAGNOSIS — G5603 Carpal tunnel syndrome, bilateral upper limbs: Secondary | ICD-10-CM | POA: Diagnosis not present

## 2019-04-08 NOTE — Telephone Encounter (Signed)
No 6/16 los

## 2019-04-09 DIAGNOSIS — Z85038 Personal history of other malignant neoplasm of large intestine: Secondary | ICD-10-CM | POA: Diagnosis not present

## 2019-04-09 DIAGNOSIS — Z7982 Long term (current) use of aspirin: Secondary | ICD-10-CM | POA: Diagnosis not present

## 2019-04-09 DIAGNOSIS — Z87440 Personal history of urinary (tract) infections: Secondary | ICD-10-CM | POA: Diagnosis not present

## 2019-04-09 DIAGNOSIS — E1122 Type 2 diabetes mellitus with diabetic chronic kidney disease: Secondary | ICD-10-CM | POA: Diagnosis not present

## 2019-04-09 DIAGNOSIS — Z792 Long term (current) use of antibiotics: Secondary | ICD-10-CM | POA: Diagnosis not present

## 2019-04-09 DIAGNOSIS — I5033 Acute on chronic diastolic (congestive) heart failure: Secondary | ICD-10-CM | POA: Diagnosis not present

## 2019-04-09 DIAGNOSIS — Z87891 Personal history of nicotine dependence: Secondary | ICD-10-CM | POA: Diagnosis not present

## 2019-04-09 DIAGNOSIS — Z9181 History of falling: Secondary | ICD-10-CM | POA: Diagnosis not present

## 2019-04-09 DIAGNOSIS — N183 Chronic kidney disease, stage 3 (moderate): Secondary | ICD-10-CM | POA: Diagnosis not present

## 2019-04-09 DIAGNOSIS — I130 Hypertensive heart and chronic kidney disease with heart failure and stage 1 through stage 4 chronic kidney disease, or unspecified chronic kidney disease: Secondary | ICD-10-CM | POA: Diagnosis not present

## 2019-04-09 DIAGNOSIS — N130 Hydronephrosis with ureteropelvic junction obstruction: Secondary | ICD-10-CM | POA: Diagnosis not present

## 2019-04-09 DIAGNOSIS — R262 Difficulty in walking, not elsewhere classified: Secondary | ICD-10-CM | POA: Diagnosis not present

## 2019-04-09 DIAGNOSIS — G5603 Carpal tunnel syndrome, bilateral upper limbs: Secondary | ICD-10-CM | POA: Diagnosis not present

## 2019-04-09 DIAGNOSIS — Z466 Encounter for fitting and adjustment of urinary device: Secondary | ICD-10-CM | POA: Diagnosis not present

## 2019-04-10 DIAGNOSIS — Z9181 History of falling: Secondary | ICD-10-CM | POA: Diagnosis not present

## 2019-04-10 DIAGNOSIS — Z87440 Personal history of urinary (tract) infections: Secondary | ICD-10-CM | POA: Diagnosis not present

## 2019-04-10 DIAGNOSIS — Z7982 Long term (current) use of aspirin: Secondary | ICD-10-CM | POA: Diagnosis not present

## 2019-04-10 DIAGNOSIS — I130 Hypertensive heart and chronic kidney disease with heart failure and stage 1 through stage 4 chronic kidney disease, or unspecified chronic kidney disease: Secondary | ICD-10-CM | POA: Diagnosis not present

## 2019-04-10 DIAGNOSIS — G5603 Carpal tunnel syndrome, bilateral upper limbs: Secondary | ICD-10-CM | POA: Diagnosis not present

## 2019-04-10 DIAGNOSIS — N130 Hydronephrosis with ureteropelvic junction obstruction: Secondary | ICD-10-CM | POA: Diagnosis not present

## 2019-04-10 DIAGNOSIS — N183 Chronic kidney disease, stage 3 (moderate): Secondary | ICD-10-CM | POA: Diagnosis not present

## 2019-04-10 DIAGNOSIS — Z85038 Personal history of other malignant neoplasm of large intestine: Secondary | ICD-10-CM | POA: Diagnosis not present

## 2019-04-10 DIAGNOSIS — I5033 Acute on chronic diastolic (congestive) heart failure: Secondary | ICD-10-CM | POA: Diagnosis not present

## 2019-04-10 DIAGNOSIS — R262 Difficulty in walking, not elsewhere classified: Secondary | ICD-10-CM | POA: Diagnosis not present

## 2019-04-10 DIAGNOSIS — Z87891 Personal history of nicotine dependence: Secondary | ICD-10-CM | POA: Diagnosis not present

## 2019-04-10 DIAGNOSIS — Z466 Encounter for fitting and adjustment of urinary device: Secondary | ICD-10-CM | POA: Diagnosis not present

## 2019-04-10 DIAGNOSIS — Z792 Long term (current) use of antibiotics: Secondary | ICD-10-CM | POA: Diagnosis not present

## 2019-04-10 DIAGNOSIS — E1122 Type 2 diabetes mellitus with diabetic chronic kidney disease: Secondary | ICD-10-CM | POA: Diagnosis not present

## 2019-04-13 ENCOUNTER — Telehealth: Payer: Self-pay | Admitting: *Deleted

## 2019-04-13 DIAGNOSIS — Z792 Long term (current) use of antibiotics: Secondary | ICD-10-CM | POA: Diagnosis not present

## 2019-04-13 DIAGNOSIS — Z85038 Personal history of other malignant neoplasm of large intestine: Secondary | ICD-10-CM | POA: Diagnosis not present

## 2019-04-13 DIAGNOSIS — R262 Difficulty in walking, not elsewhere classified: Secondary | ICD-10-CM | POA: Diagnosis not present

## 2019-04-13 DIAGNOSIS — E1122 Type 2 diabetes mellitus with diabetic chronic kidney disease: Secondary | ICD-10-CM | POA: Diagnosis not present

## 2019-04-13 DIAGNOSIS — I130 Hypertensive heart and chronic kidney disease with heart failure and stage 1 through stage 4 chronic kidney disease, or unspecified chronic kidney disease: Secondary | ICD-10-CM | POA: Diagnosis not present

## 2019-04-13 DIAGNOSIS — N183 Chronic kidney disease, stage 3 (moderate): Secondary | ICD-10-CM | POA: Diagnosis not present

## 2019-04-13 DIAGNOSIS — I5033 Acute on chronic diastolic (congestive) heart failure: Secondary | ICD-10-CM | POA: Diagnosis not present

## 2019-04-13 DIAGNOSIS — N130 Hydronephrosis with ureteropelvic junction obstruction: Secondary | ICD-10-CM | POA: Diagnosis not present

## 2019-04-13 DIAGNOSIS — Z87440 Personal history of urinary (tract) infections: Secondary | ICD-10-CM | POA: Diagnosis not present

## 2019-04-13 DIAGNOSIS — Z87891 Personal history of nicotine dependence: Secondary | ICD-10-CM | POA: Diagnosis not present

## 2019-04-13 DIAGNOSIS — Z466 Encounter for fitting and adjustment of urinary device: Secondary | ICD-10-CM | POA: Diagnosis not present

## 2019-04-13 DIAGNOSIS — Z9181 History of falling: Secondary | ICD-10-CM | POA: Diagnosis not present

## 2019-04-13 DIAGNOSIS — G5603 Carpal tunnel syndrome, bilateral upper limbs: Secondary | ICD-10-CM | POA: Diagnosis not present

## 2019-04-13 DIAGNOSIS — Z7982 Long term (current) use of aspirin: Secondary | ICD-10-CM | POA: Diagnosis not present

## 2019-04-13 NOTE — Telephone Encounter (Signed)
Medical records faxed to [facility] - Release: [id number]

## 2019-04-14 DIAGNOSIS — I130 Hypertensive heart and chronic kidney disease with heart failure and stage 1 through stage 4 chronic kidney disease, or unspecified chronic kidney disease: Secondary | ICD-10-CM | POA: Diagnosis not present

## 2019-04-14 DIAGNOSIS — Z792 Long term (current) use of antibiotics: Secondary | ICD-10-CM | POA: Diagnosis not present

## 2019-04-14 DIAGNOSIS — Z9181 History of falling: Secondary | ICD-10-CM | POA: Diagnosis not present

## 2019-04-14 DIAGNOSIS — E1122 Type 2 diabetes mellitus with diabetic chronic kidney disease: Secondary | ICD-10-CM | POA: Diagnosis not present

## 2019-04-14 DIAGNOSIS — Z85038 Personal history of other malignant neoplasm of large intestine: Secondary | ICD-10-CM | POA: Diagnosis not present

## 2019-04-14 DIAGNOSIS — I5033 Acute on chronic diastolic (congestive) heart failure: Secondary | ICD-10-CM | POA: Diagnosis not present

## 2019-04-14 DIAGNOSIS — Z7982 Long term (current) use of aspirin: Secondary | ICD-10-CM | POA: Diagnosis not present

## 2019-04-14 DIAGNOSIS — Z87440 Personal history of urinary (tract) infections: Secondary | ICD-10-CM | POA: Diagnosis not present

## 2019-04-14 DIAGNOSIS — Z466 Encounter for fitting and adjustment of urinary device: Secondary | ICD-10-CM | POA: Diagnosis not present

## 2019-04-14 DIAGNOSIS — G5603 Carpal tunnel syndrome, bilateral upper limbs: Secondary | ICD-10-CM | POA: Diagnosis not present

## 2019-04-14 DIAGNOSIS — N183 Chronic kidney disease, stage 3 (moderate): Secondary | ICD-10-CM | POA: Diagnosis not present

## 2019-04-14 DIAGNOSIS — R262 Difficulty in walking, not elsewhere classified: Secondary | ICD-10-CM | POA: Diagnosis not present

## 2019-04-14 DIAGNOSIS — N130 Hydronephrosis with ureteropelvic junction obstruction: Secondary | ICD-10-CM | POA: Diagnosis not present

## 2019-04-14 DIAGNOSIS — Z87891 Personal history of nicotine dependence: Secondary | ICD-10-CM | POA: Diagnosis not present

## 2019-04-15 DIAGNOSIS — Z87891 Personal history of nicotine dependence: Secondary | ICD-10-CM | POA: Diagnosis not present

## 2019-04-15 DIAGNOSIS — Z9181 History of falling: Secondary | ICD-10-CM | POA: Diagnosis not present

## 2019-04-15 DIAGNOSIS — I5033 Acute on chronic diastolic (congestive) heart failure: Secondary | ICD-10-CM | POA: Diagnosis not present

## 2019-04-15 DIAGNOSIS — I130 Hypertensive heart and chronic kidney disease with heart failure and stage 1 through stage 4 chronic kidney disease, or unspecified chronic kidney disease: Secondary | ICD-10-CM | POA: Diagnosis not present

## 2019-04-15 DIAGNOSIS — G5603 Carpal tunnel syndrome, bilateral upper limbs: Secondary | ICD-10-CM | POA: Diagnosis not present

## 2019-04-15 DIAGNOSIS — Z792 Long term (current) use of antibiotics: Secondary | ICD-10-CM | POA: Diagnosis not present

## 2019-04-15 DIAGNOSIS — Z85038 Personal history of other malignant neoplasm of large intestine: Secondary | ICD-10-CM | POA: Diagnosis not present

## 2019-04-15 DIAGNOSIS — R262 Difficulty in walking, not elsewhere classified: Secondary | ICD-10-CM | POA: Diagnosis not present

## 2019-04-15 DIAGNOSIS — N130 Hydronephrosis with ureteropelvic junction obstruction: Secondary | ICD-10-CM | POA: Diagnosis not present

## 2019-04-15 DIAGNOSIS — Z7982 Long term (current) use of aspirin: Secondary | ICD-10-CM | POA: Diagnosis not present

## 2019-04-15 DIAGNOSIS — Z466 Encounter for fitting and adjustment of urinary device: Secondary | ICD-10-CM | POA: Diagnosis not present

## 2019-04-15 DIAGNOSIS — N183 Chronic kidney disease, stage 3 (moderate): Secondary | ICD-10-CM | POA: Diagnosis not present

## 2019-04-15 DIAGNOSIS — E1122 Type 2 diabetes mellitus with diabetic chronic kidney disease: Secondary | ICD-10-CM | POA: Diagnosis not present

## 2019-04-15 DIAGNOSIS — Z87440 Personal history of urinary (tract) infections: Secondary | ICD-10-CM | POA: Diagnosis not present

## 2019-04-16 DIAGNOSIS — Z87440 Personal history of urinary (tract) infections: Secondary | ICD-10-CM | POA: Diagnosis not present

## 2019-04-16 DIAGNOSIS — Z85038 Personal history of other malignant neoplasm of large intestine: Secondary | ICD-10-CM | POA: Diagnosis not present

## 2019-04-16 DIAGNOSIS — Z7982 Long term (current) use of aspirin: Secondary | ICD-10-CM | POA: Diagnosis not present

## 2019-04-16 DIAGNOSIS — N130 Hydronephrosis with ureteropelvic junction obstruction: Secondary | ICD-10-CM | POA: Diagnosis not present

## 2019-04-16 DIAGNOSIS — Z9181 History of falling: Secondary | ICD-10-CM | POA: Diagnosis not present

## 2019-04-16 DIAGNOSIS — I130 Hypertensive heart and chronic kidney disease with heart failure and stage 1 through stage 4 chronic kidney disease, or unspecified chronic kidney disease: Secondary | ICD-10-CM | POA: Diagnosis not present

## 2019-04-16 DIAGNOSIS — R262 Difficulty in walking, not elsewhere classified: Secondary | ICD-10-CM | POA: Diagnosis not present

## 2019-04-16 DIAGNOSIS — I5033 Acute on chronic diastolic (congestive) heart failure: Secondary | ICD-10-CM | POA: Diagnosis not present

## 2019-04-16 DIAGNOSIS — E1122 Type 2 diabetes mellitus with diabetic chronic kidney disease: Secondary | ICD-10-CM | POA: Diagnosis not present

## 2019-04-16 DIAGNOSIS — Z87891 Personal history of nicotine dependence: Secondary | ICD-10-CM | POA: Diagnosis not present

## 2019-04-16 DIAGNOSIS — Z466 Encounter for fitting and adjustment of urinary device: Secondary | ICD-10-CM | POA: Diagnosis not present

## 2019-04-16 DIAGNOSIS — Z792 Long term (current) use of antibiotics: Secondary | ICD-10-CM | POA: Diagnosis not present

## 2019-04-16 DIAGNOSIS — G5603 Carpal tunnel syndrome, bilateral upper limbs: Secondary | ICD-10-CM | POA: Diagnosis not present

## 2019-04-16 DIAGNOSIS — N183 Chronic kidney disease, stage 3 (moderate): Secondary | ICD-10-CM | POA: Diagnosis not present

## 2019-04-17 DIAGNOSIS — Z7982 Long term (current) use of aspirin: Secondary | ICD-10-CM | POA: Diagnosis not present

## 2019-04-17 DIAGNOSIS — Z87440 Personal history of urinary (tract) infections: Secondary | ICD-10-CM | POA: Diagnosis not present

## 2019-04-17 DIAGNOSIS — Z87891 Personal history of nicotine dependence: Secondary | ICD-10-CM | POA: Diagnosis not present

## 2019-04-17 DIAGNOSIS — R262 Difficulty in walking, not elsewhere classified: Secondary | ICD-10-CM | POA: Diagnosis not present

## 2019-04-17 DIAGNOSIS — I5033 Acute on chronic diastolic (congestive) heart failure: Secondary | ICD-10-CM | POA: Diagnosis not present

## 2019-04-17 DIAGNOSIS — Z9181 History of falling: Secondary | ICD-10-CM | POA: Diagnosis not present

## 2019-04-17 DIAGNOSIS — Z792 Long term (current) use of antibiotics: Secondary | ICD-10-CM | POA: Diagnosis not present

## 2019-04-17 DIAGNOSIS — N130 Hydronephrosis with ureteropelvic junction obstruction: Secondary | ICD-10-CM | POA: Diagnosis not present

## 2019-04-17 DIAGNOSIS — Z466 Encounter for fitting and adjustment of urinary device: Secondary | ICD-10-CM | POA: Diagnosis not present

## 2019-04-17 DIAGNOSIS — I130 Hypertensive heart and chronic kidney disease with heart failure and stage 1 through stage 4 chronic kidney disease, or unspecified chronic kidney disease: Secondary | ICD-10-CM | POA: Diagnosis not present

## 2019-04-17 DIAGNOSIS — N183 Chronic kidney disease, stage 3 (moderate): Secondary | ICD-10-CM | POA: Diagnosis not present

## 2019-04-17 DIAGNOSIS — E1122 Type 2 diabetes mellitus with diabetic chronic kidney disease: Secondary | ICD-10-CM | POA: Diagnosis not present

## 2019-04-17 DIAGNOSIS — Z85038 Personal history of other malignant neoplasm of large intestine: Secondary | ICD-10-CM | POA: Diagnosis not present

## 2019-04-17 DIAGNOSIS — G5603 Carpal tunnel syndrome, bilateral upper limbs: Secondary | ICD-10-CM | POA: Diagnosis not present

## 2019-04-22 DIAGNOSIS — Z85038 Personal history of other malignant neoplasm of large intestine: Secondary | ICD-10-CM | POA: Diagnosis not present

## 2019-04-22 DIAGNOSIS — Z9181 History of falling: Secondary | ICD-10-CM | POA: Diagnosis not present

## 2019-04-22 DIAGNOSIS — E1122 Type 2 diabetes mellitus with diabetic chronic kidney disease: Secondary | ICD-10-CM | POA: Diagnosis not present

## 2019-04-22 DIAGNOSIS — Z792 Long term (current) use of antibiotics: Secondary | ICD-10-CM | POA: Diagnosis not present

## 2019-04-22 DIAGNOSIS — G5603 Carpal tunnel syndrome, bilateral upper limbs: Secondary | ICD-10-CM | POA: Diagnosis not present

## 2019-04-22 DIAGNOSIS — R262 Difficulty in walking, not elsewhere classified: Secondary | ICD-10-CM | POA: Diagnosis not present

## 2019-04-22 DIAGNOSIS — I5033 Acute on chronic diastolic (congestive) heart failure: Secondary | ICD-10-CM | POA: Diagnosis not present

## 2019-04-22 DIAGNOSIS — Z87891 Personal history of nicotine dependence: Secondary | ICD-10-CM | POA: Diagnosis not present

## 2019-04-22 DIAGNOSIS — J449 Chronic obstructive pulmonary disease, unspecified: Secondary | ICD-10-CM | POA: Diagnosis not present

## 2019-04-22 DIAGNOSIS — N183 Chronic kidney disease, stage 3 (moderate): Secondary | ICD-10-CM | POA: Diagnosis not present

## 2019-04-22 DIAGNOSIS — Z7982 Long term (current) use of aspirin: Secondary | ICD-10-CM | POA: Diagnosis not present

## 2019-04-22 DIAGNOSIS — Z87440 Personal history of urinary (tract) infections: Secondary | ICD-10-CM | POA: Diagnosis not present

## 2019-04-22 DIAGNOSIS — I130 Hypertensive heart and chronic kidney disease with heart failure and stage 1 through stage 4 chronic kidney disease, or unspecified chronic kidney disease: Secondary | ICD-10-CM | POA: Diagnosis not present

## 2019-04-22 DIAGNOSIS — Z466 Encounter for fitting and adjustment of urinary device: Secondary | ICD-10-CM | POA: Diagnosis not present

## 2019-04-22 DIAGNOSIS — N130 Hydronephrosis with ureteropelvic junction obstruction: Secondary | ICD-10-CM | POA: Diagnosis not present

## 2019-04-24 DIAGNOSIS — I5033 Acute on chronic diastolic (congestive) heart failure: Secondary | ICD-10-CM | POA: Diagnosis not present

## 2019-04-24 DIAGNOSIS — Z87891 Personal history of nicotine dependence: Secondary | ICD-10-CM | POA: Diagnosis not present

## 2019-04-24 DIAGNOSIS — G5603 Carpal tunnel syndrome, bilateral upper limbs: Secondary | ICD-10-CM | POA: Diagnosis not present

## 2019-04-24 DIAGNOSIS — E1122 Type 2 diabetes mellitus with diabetic chronic kidney disease: Secondary | ICD-10-CM | POA: Diagnosis not present

## 2019-04-24 DIAGNOSIS — N130 Hydronephrosis with ureteropelvic junction obstruction: Secondary | ICD-10-CM | POA: Diagnosis not present

## 2019-04-24 DIAGNOSIS — Z87440 Personal history of urinary (tract) infections: Secondary | ICD-10-CM | POA: Diagnosis not present

## 2019-04-24 DIAGNOSIS — Z85038 Personal history of other malignant neoplasm of large intestine: Secondary | ICD-10-CM | POA: Diagnosis not present

## 2019-04-24 DIAGNOSIS — Z466 Encounter for fitting and adjustment of urinary device: Secondary | ICD-10-CM | POA: Diagnosis not present

## 2019-04-24 DIAGNOSIS — Z792 Long term (current) use of antibiotics: Secondary | ICD-10-CM | POA: Diagnosis not present

## 2019-04-24 DIAGNOSIS — Z7982 Long term (current) use of aspirin: Secondary | ICD-10-CM | POA: Diagnosis not present

## 2019-04-24 DIAGNOSIS — N183 Chronic kidney disease, stage 3 (moderate): Secondary | ICD-10-CM | POA: Diagnosis not present

## 2019-04-24 DIAGNOSIS — R262 Difficulty in walking, not elsewhere classified: Secondary | ICD-10-CM | POA: Diagnosis not present

## 2019-04-24 DIAGNOSIS — I130 Hypertensive heart and chronic kidney disease with heart failure and stage 1 through stage 4 chronic kidney disease, or unspecified chronic kidney disease: Secondary | ICD-10-CM | POA: Diagnosis not present

## 2019-04-24 DIAGNOSIS — Z9181 History of falling: Secondary | ICD-10-CM | POA: Diagnosis not present

## 2019-04-30 DIAGNOSIS — N130 Hydronephrosis with ureteropelvic junction obstruction: Secondary | ICD-10-CM | POA: Diagnosis not present

## 2019-04-30 DIAGNOSIS — Z9181 History of falling: Secondary | ICD-10-CM | POA: Diagnosis not present

## 2019-04-30 DIAGNOSIS — Z87440 Personal history of urinary (tract) infections: Secondary | ICD-10-CM | POA: Diagnosis not present

## 2019-04-30 DIAGNOSIS — Z7982 Long term (current) use of aspirin: Secondary | ICD-10-CM | POA: Diagnosis not present

## 2019-04-30 DIAGNOSIS — Z466 Encounter for fitting and adjustment of urinary device: Secondary | ICD-10-CM | POA: Diagnosis not present

## 2019-04-30 DIAGNOSIS — I5033 Acute on chronic diastolic (congestive) heart failure: Secondary | ICD-10-CM | POA: Diagnosis not present

## 2019-04-30 DIAGNOSIS — Z792 Long term (current) use of antibiotics: Secondary | ICD-10-CM | POA: Diagnosis not present

## 2019-04-30 DIAGNOSIS — I130 Hypertensive heart and chronic kidney disease with heart failure and stage 1 through stage 4 chronic kidney disease, or unspecified chronic kidney disease: Secondary | ICD-10-CM | POA: Diagnosis not present

## 2019-04-30 DIAGNOSIS — G5603 Carpal tunnel syndrome, bilateral upper limbs: Secondary | ICD-10-CM | POA: Diagnosis not present

## 2019-04-30 DIAGNOSIS — N183 Chronic kidney disease, stage 3 (moderate): Secondary | ICD-10-CM | POA: Diagnosis not present

## 2019-04-30 DIAGNOSIS — R262 Difficulty in walking, not elsewhere classified: Secondary | ICD-10-CM | POA: Diagnosis not present

## 2019-04-30 DIAGNOSIS — E1122 Type 2 diabetes mellitus with diabetic chronic kidney disease: Secondary | ICD-10-CM | POA: Diagnosis not present

## 2019-04-30 DIAGNOSIS — Z85038 Personal history of other malignant neoplasm of large intestine: Secondary | ICD-10-CM | POA: Diagnosis not present

## 2019-04-30 DIAGNOSIS — Z87891 Personal history of nicotine dependence: Secondary | ICD-10-CM | POA: Diagnosis not present

## 2019-05-06 DIAGNOSIS — R262 Difficulty in walking, not elsewhere classified: Secondary | ICD-10-CM | POA: Diagnosis not present

## 2019-05-06 DIAGNOSIS — N130 Hydronephrosis with ureteropelvic junction obstruction: Secondary | ICD-10-CM | POA: Diagnosis not present

## 2019-05-06 DIAGNOSIS — Z7982 Long term (current) use of aspirin: Secondary | ICD-10-CM | POA: Diagnosis not present

## 2019-05-06 DIAGNOSIS — Z85038 Personal history of other malignant neoplasm of large intestine: Secondary | ICD-10-CM | POA: Diagnosis not present

## 2019-05-06 DIAGNOSIS — I5033 Acute on chronic diastolic (congestive) heart failure: Secondary | ICD-10-CM | POA: Diagnosis not present

## 2019-05-06 DIAGNOSIS — Z466 Encounter for fitting and adjustment of urinary device: Secondary | ICD-10-CM | POA: Diagnosis not present

## 2019-05-06 DIAGNOSIS — G5603 Carpal tunnel syndrome, bilateral upper limbs: Secondary | ICD-10-CM | POA: Diagnosis not present

## 2019-05-06 DIAGNOSIS — N183 Chronic kidney disease, stage 3 (moderate): Secondary | ICD-10-CM | POA: Diagnosis not present

## 2019-05-06 DIAGNOSIS — I130 Hypertensive heart and chronic kidney disease with heart failure and stage 1 through stage 4 chronic kidney disease, or unspecified chronic kidney disease: Secondary | ICD-10-CM | POA: Diagnosis not present

## 2019-05-06 DIAGNOSIS — Z9181 History of falling: Secondary | ICD-10-CM | POA: Diagnosis not present

## 2019-05-06 DIAGNOSIS — Z792 Long term (current) use of antibiotics: Secondary | ICD-10-CM | POA: Diagnosis not present

## 2019-05-06 DIAGNOSIS — E1122 Type 2 diabetes mellitus with diabetic chronic kidney disease: Secondary | ICD-10-CM | POA: Diagnosis not present

## 2019-05-06 DIAGNOSIS — Z87440 Personal history of urinary (tract) infections: Secondary | ICD-10-CM | POA: Diagnosis not present

## 2019-05-06 DIAGNOSIS — Z87891 Personal history of nicotine dependence: Secondary | ICD-10-CM | POA: Diagnosis not present

## 2019-05-14 ENCOUNTER — Emergency Department (HOSPITAL_COMMUNITY)
Admission: EM | Admit: 2019-05-14 | Discharge: 2019-05-14 | Disposition: A | Discharge status: Home / Self Care | Payer: Medicare Other | Attending: Emergency Medicine | Admitting: Emergency Medicine

## 2019-05-14 ENCOUNTER — Encounter (HOSPITAL_COMMUNITY): Payer: Self-pay

## 2019-05-14 ENCOUNTER — Other Ambulatory Visit: Payer: Self-pay

## 2019-05-14 DIAGNOSIS — N183 Chronic kidney disease, stage 3 (moderate): Secondary | ICD-10-CM | POA: Insufficient documentation

## 2019-05-14 DIAGNOSIS — I130 Hypertensive heart and chronic kidney disease with heart failure and stage 1 through stage 4 chronic kidney disease, or unspecified chronic kidney disease: Secondary | ICD-10-CM | POA: Insufficient documentation

## 2019-05-14 DIAGNOSIS — R1084 Generalized abdominal pain: Secondary | ICD-10-CM | POA: Insufficient documentation

## 2019-05-14 DIAGNOSIS — Z79899 Other long term (current) drug therapy: Secondary | ICD-10-CM | POA: Diagnosis not present

## 2019-05-14 DIAGNOSIS — K5641 Fecal impaction: Secondary | ICD-10-CM | POA: Insufficient documentation

## 2019-05-14 DIAGNOSIS — Z85038 Personal history of other malignant neoplasm of large intestine: Secondary | ICD-10-CM | POA: Insufficient documentation

## 2019-05-14 DIAGNOSIS — Z7982 Long term (current) use of aspirin: Secondary | ICD-10-CM | POA: Insufficient documentation

## 2019-05-14 DIAGNOSIS — I5032 Chronic diastolic (congestive) heart failure: Secondary | ICD-10-CM | POA: Insufficient documentation

## 2019-05-14 DIAGNOSIS — E1122 Type 2 diabetes mellitus with diabetic chronic kidney disease: Secondary | ICD-10-CM | POA: Diagnosis not present

## 2019-05-14 DIAGNOSIS — Z8546 Personal history of malignant neoplasm of prostate: Secondary | ICD-10-CM | POA: Diagnosis not present

## 2019-05-14 DIAGNOSIS — Z87891 Personal history of nicotine dependence: Secondary | ICD-10-CM | POA: Insufficient documentation

## 2019-05-14 LAB — CBC WITH DIFFERENTIAL/PLATELET
Abs Immature Granulocytes: 0.02 10*3/uL (ref 0.00–0.07)
Basophils Absolute: 0 10*3/uL (ref 0.0–0.1)
Basophils Relative: 0 %
Eosinophils Absolute: 0.2 10*3/uL (ref 0.0–0.5)
Eosinophils Relative: 4 %
HCT: 29.4 % — ABNORMAL LOW (ref 39.0–52.0)
Hemoglobin: 9.2 g/dL — ABNORMAL LOW (ref 13.0–17.0)
Immature Granulocytes: 0 %
Lymphocytes Relative: 25 %
Lymphs Abs: 1.1 10*3/uL (ref 0.7–4.0)
MCH: 25.7 pg — ABNORMAL LOW (ref 26.0–34.0)
MCHC: 31.3 g/dL (ref 30.0–36.0)
MCV: 82.1 fL (ref 80.0–100.0)
Monocytes Absolute: 0.4 10*3/uL (ref 0.1–1.0)
Monocytes Relative: 8 %
Neutro Abs: 2.8 10*3/uL (ref 1.7–7.7)
Neutrophils Relative %: 63 %
Platelets: 222 10*3/uL (ref 150–400)
RBC: 3.58 MIL/uL — ABNORMAL LOW (ref 4.22–5.81)
RDW: 17.2 % — ABNORMAL HIGH (ref 11.5–15.5)
WBC: 4.5 10*3/uL (ref 4.0–10.5)
nRBC: 0 % (ref 0.0–0.2)

## 2019-05-14 LAB — COMPREHENSIVE METABOLIC PANEL
ALT: 7 U/L (ref 0–44)
AST: 33 U/L (ref 15–41)
Albumin: 3.2 g/dL — ABNORMAL LOW (ref 3.5–5.0)
Alkaline Phosphatase: 83 U/L (ref 38–126)
Anion gap: 11 (ref 5–15)
BUN: 63 mg/dL — ABNORMAL HIGH (ref 8–23)
CO2: 24 mmol/L (ref 22–32)
Calcium: 9.1 mg/dL (ref 8.9–10.3)
Chloride: 105 mmol/L (ref 98–111)
Creatinine, Ser: 2.63 mg/dL — ABNORMAL HIGH (ref 0.61–1.24)
GFR calc Af Amer: 23 mL/min — ABNORMAL LOW (ref >60)
GFR calc non Af Amer: 20 mL/min — ABNORMAL LOW (ref >60)
Glucose, Bld: 118 mg/dL — ABNORMAL HIGH (ref 70–99)
Potassium: 5 mmol/L (ref 3.5–5.1)
Sodium: 140 mmol/L (ref 135–145)
Total Bilirubin: 1.1 mg/dL (ref 0.3–1.2)
Total Protein: 7.2 g/dL (ref 6.5–8.1)

## 2019-05-14 LAB — LIPASE, BLOOD: Lipase: 49 U/L (ref 11–51)

## 2019-05-14 MED ORDER — FLEET ENEMA 7-19 GM/118ML RE ENEM
1.0000 | ENEMA | Freq: Once | RECTAL | Status: AC
  Administered 2019-05-14: 1 via RECTAL
  Filled 2019-05-14: qty 1

## 2019-05-14 NOTE — ED Notes (Signed)
Pt awaiting dt for pick up.

## 2019-05-14 NOTE — Discharge Instructions (Addendum)
Magnesium citrate: Drink the entire 10 ounce bottle mixed with equal parts Sprite or Gatorade for relief of constipation/retained stool.  Return to the emergency department if you develop high fever, worsening pain, bloody stools, or other new and concerning symptoms.

## 2019-05-14 NOTE — ED Provider Notes (Signed)
[facility] DEPT Provider Note   CSN: [id number] Arrival date & time: 05/14/19  1724     History   Chief Complaint Chief Complaint  Patient presents with  . Abdominal Pain    HPI [name] is a 85 y.o. male.     Patient is a 85 year old male with past medical history of chronic renal insufficiency, diabetes, CHF.  He presents today for evaluation of abdominal pain and constipation.  States he has not had a bowel movement in 1 week.  He feels as though he has to have a bowel movement but is unable to evacuate stool.  He denies any fevers or chills.  He denies any vomiting.  The history is provided by the patient.  Abdominal Pain Pain location:  Generalized Pain quality: cramping   Pain radiates to:  Does not radiate Pain severity:  Moderate Duration:  1 week Timing:  Intermittent Progression:  Worsening Chronicity:  New   Past Medical History:  Diagnosis Date  . Adenomatous polyps 09/09/2008  . Anemia   . Aortic atherosclerosis ([place]) 01/2019  . Arthritis   . Bladder tumor   . Blood in urine   . Cardiomegaly 01/2019  . Carpal tunnel syndrome, bilateral 11/21/2016  . Chest pain on exertion   . Chronic kidney disease (CKD), stage III (moderate) (HCC)   . Colon cancer ([place]) dx'd 2008   surg only  . Confusion    with last hospital visit, april 2020  . DDD (degenerative disc disease), lumbar   . Diabetes mellitus without complication ([place])    Diet controlled  . Diastolic heart failure ([place])   . Dyspnea   . Falls frequently   . Hearing problem   . History of GI bleed   . Hypertension   . LBBB (left bundle branch block) 02/16/2019   noted on EKG  . Lipid disorder   . LVH (left ventricular hypertrophy) 03/03/2019    Moderate, Noted on ECHO  . Nerve pain   . Pleural effusion 01/2019   Right  . Pneumonia 01/2019  . Prostate cancer ([place]) dx'd 1993   surg only  . Thyroid cyst 09/2007   Bilateral  . Vertigo     Patient  Active Problem List   Diagnosis Date Noted  . Bladder tumor 03/17/2019  . Chronic heart failure with preserved ejection fraction ([place]) 02/27/2019  . Pre-op evaluation 02/27/2019  . Hematuria 02/13/2019  . AKI (acute kidney injury) ([place]) 02/13/2019  . CKD stage G3b/A2, GFR 30-44 and albumin creatinine ratio 30-299 mg/g (HCC) 02/13/2019  . Diastolic CHF ([place]) 35/00/9381  . Acute CHF (congestive heart failure) ([place]) 12/02/2017  . Hypothermia 12/02/2017  . Dyspnea 12/02/2017  . CHF (congestive heart failure) ([place]) 12/02/2017  . Carpal tunnel syndrome, bilateral 11/21/2016  . Falls frequently   . Vertigo 05/20/2014  . PROSTATE CANCER 08/19/2008  . DM 08/19/2008  . HYPERLIPIDEMIA 08/19/2008  . Essential hypertension 08/19/2008  . Personal history of colon cancer, stage II 08/19/2008    Past Surgical History:  Procedure Laterality Date  . CATARACT EXTRACTION W/PHACO Left 06/10/2013   Procedure: CATARACT EXTRACTION PHACO AND INTRAOCULAR LENS PLACEMENT (IOC);  Surgeon: Adonis Brook, MD;  Location: [facility];  Service: Ophthalmology;  Laterality: Left;  . COLONOSCOPY    . ESOPHAGOGASTRODUODENOSCOPY    . EYE SURGERY     cataract surgery, right eye  . laparoscopic assisted right hemicolectomy  09/21/07  . PROSTATECTOMY    . TRANSURETHRAL RESECTION OF BLADDER TUMOR N/A  03/17/2019   Procedure: TRANSURETHRAL RESECTION OF BLADDER TUMOR (TURBT);  Surgeon: Cleon Gustin, MD;  Location: [facility];  Service: Urology;  Laterality: N/A;        Home Medications    Prior to Admission medications   Medication Sig Start Date End Date Taking? Authorizing Provider  amLODipine (NORVASC) 10 MG tablet Take 10 mg by mouth daily.    [provider]  aspirin EC 81 MG tablet Take 81 mg by mouth daily.    [provider]  Blood Glucose Monitoring Suppl (ONE TOUCH ULTRA 2) w/Device KIT USE TO CHECK GLUCOSE AS DIRECTED ONCE DAILY 01/15/19   [provider]  citalopram (CELEXA) 10 MG  tablet Take 10 mg by mouth daily. 11/24/17   [provider]  ENTRESTO 24-26 MG Take 1 tablet by mouth 2 (two) times daily. 11/17/18   [provider]  furosemide (LASIX) 40 MG tablet Take 40 mg by mouth daily.    [provider]  ipratropium-albuterol (DUONEB) 0.5-2.5 (3) MG/3ML SOLN Take 3 mLs by nebulization every 6 (six) hours as needed. Patient taking differently: Take 3 mLs by nebulization every 6 (six) hours as needed (shortness of breath).  02/17/19   Sheikh, Omair Latif, DO  ONE TOUCH ULTRA TEST test strip USE 1 STRIP TO CHECK GLUCOSE TWICE DAILY 01/15/19   [provider]  oxybutynin (DITROPAN) 5 MG tablet Take 2.5 mg by mouth 3 (three) times daily.    [provider]  polyethylene glycol (MIRALAX / GLYCOLAX) 17 g packet Take 17 g by mouth daily as needed for mild constipation.    [provider]  tamsulosin (FLOMAX) 0.4 MG CAPS capsule Take 1 capsule (0.4 mg total) by mouth daily. 02/18/19   Kerney Elbe, DO    Family History Family History  Problem Relation Age of Onset  . Diabetes Other   . Hypertension Mother   . Stroke Mother     Social History Social History   Tobacco Use  . Smoking status: Former Smoker    Years: 1.00    Types: Cigarettes    Quit date: 06/10/1983    Years since quitting: 35.9  . Smokeless tobacco: Never Used  Substance Use Topics  . Alcohol use: Not Currently  . Drug use: No     Allergies   Patient has no known allergies.   Review of Systems Review of Systems  Gastrointestinal: Positive for abdominal pain.  All other systems reviewed and are negative.    Physical Exam Updated Vital Signs BP (!) 120/51   Pulse (!) 55   Temp (!) 97.4 F (36.3 C) (Oral)   Resp 14   Wt 85 kg   SpO2 100%   BMI 32.17 kg/m   Physical Exam Vitals signs and nursing note reviewed.  Constitutional:      General: He is not in acute distress.    Appearance: He is well-developed. He is not  diaphoretic.  HENT:     Head: Normocephalic and atraumatic.  Neck:     Musculoskeletal: Normal range of motion and neck supple.  Cardiovascular:     Rate and Rhythm: Normal rate and regular rhythm.     Heart sounds: No murmur. No friction rub.  Pulmonary:     Effort: Pulmonary effort is normal. No respiratory distress.     Breath sounds: Normal breath sounds. No wheezing or rales.  Abdominal:     General: Bowel sounds are normal. There is no distension.  Palpations: Abdomen is soft.     Tenderness: There is generalized abdominal tenderness. There is no guarding or rebound.  Genitourinary:    Rectum: Tenderness present.     Comments: Fecal impaction present.  Stool brown in color with no melena or gross blood. Musculoskeletal: Normal range of motion.  Skin:    General: Skin is warm and dry.  Neurological:     Mental Status: He is alert and oriented to person, place, and time.     Coordination: Coordination normal.      ED Treatments / Results  Labs (all labs ordered are listed, but only abnormal results are displayed) Labs Reviewed  COMPREHENSIVE METABOLIC PANEL - Abnormal; Notable for the following components:      Result Value   Glucose, Bld 118 (*)    BUN 63 (*)    Creatinine, Ser 2.63 (*)    Albumin 3.2 (*)    GFR calc non Af Amer 20 (*)    GFR calc Af Amer 23 (*)    All other components within normal limits  CBC WITH DIFFERENTIAL/PLATELET - Abnormal; Notable for the following components:   RBC 3.58 (*)    Hemoglobin 9.2 (*)    HCT 29.4 (*)    MCH 25.7 (*)    RDW 17.2 (*)    All other components within normal limits  LIPASE, BLOOD    EKG None  Radiology No results found.  Procedures Procedures (including critical care time)  Medications Ordered in ED Medications  sodium phosphate (FLEET) 7-19 GM/118ML enema 1 enema (1 enema Rectal Given 05/14/19 1915)     Initial Impression / Assessment and Plan / ED Course  I have reviewed the triage vital  signs and the nursing notes.  Pertinent labs & imaging results that were available during my care of the patient were reviewed by me and considered in my medical decision making (see chart for details).  Patient presenting with generalized abdominal pain and constipation for the past week.  Rectal examination reveals a fecal impaction.  Patient was not tolerant of manual disimpaction.  He was given a fleets enema with good results.  Patient is now feeling much improved.  He will be discharged with magnesium citrate and is to follow-up as needed.  His abdominal exam remains benign and laboratory studies are reassuring.  He has no fever and no white count.  Creatinine is elevated, but consistent with his baseline.  Final Clinical Impressions(s) / ED Diagnoses   Final diagnoses:  None    ED Discharge Orders    None       Veryl Speak, MD 05/14/19 2028

## 2019-05-14 NOTE — ED Triage Notes (Addendum)
Pt states he has been having stomach and backside pain x 1 week.  Pt has hx of colon cancer- no chemo tx.  Daughter is [name], who is working for [employer]. Work phone is [phone].

## 2019-05-23 DIAGNOSIS — J449 Chronic obstructive pulmonary disease, unspecified: Secondary | ICD-10-CM | POA: Diagnosis not present

## 2019-06-05 IMAGING — CR DG CHEST 2V
2 series · 2 of 2 positions shown · non-contrast
Comparison: Prior radiograph from 10/23/2017

CLINICAL DATA: Initial evaluation for acute shortness of breath.

EXAM:
CHEST - 2 VIEW

[w chest lat]
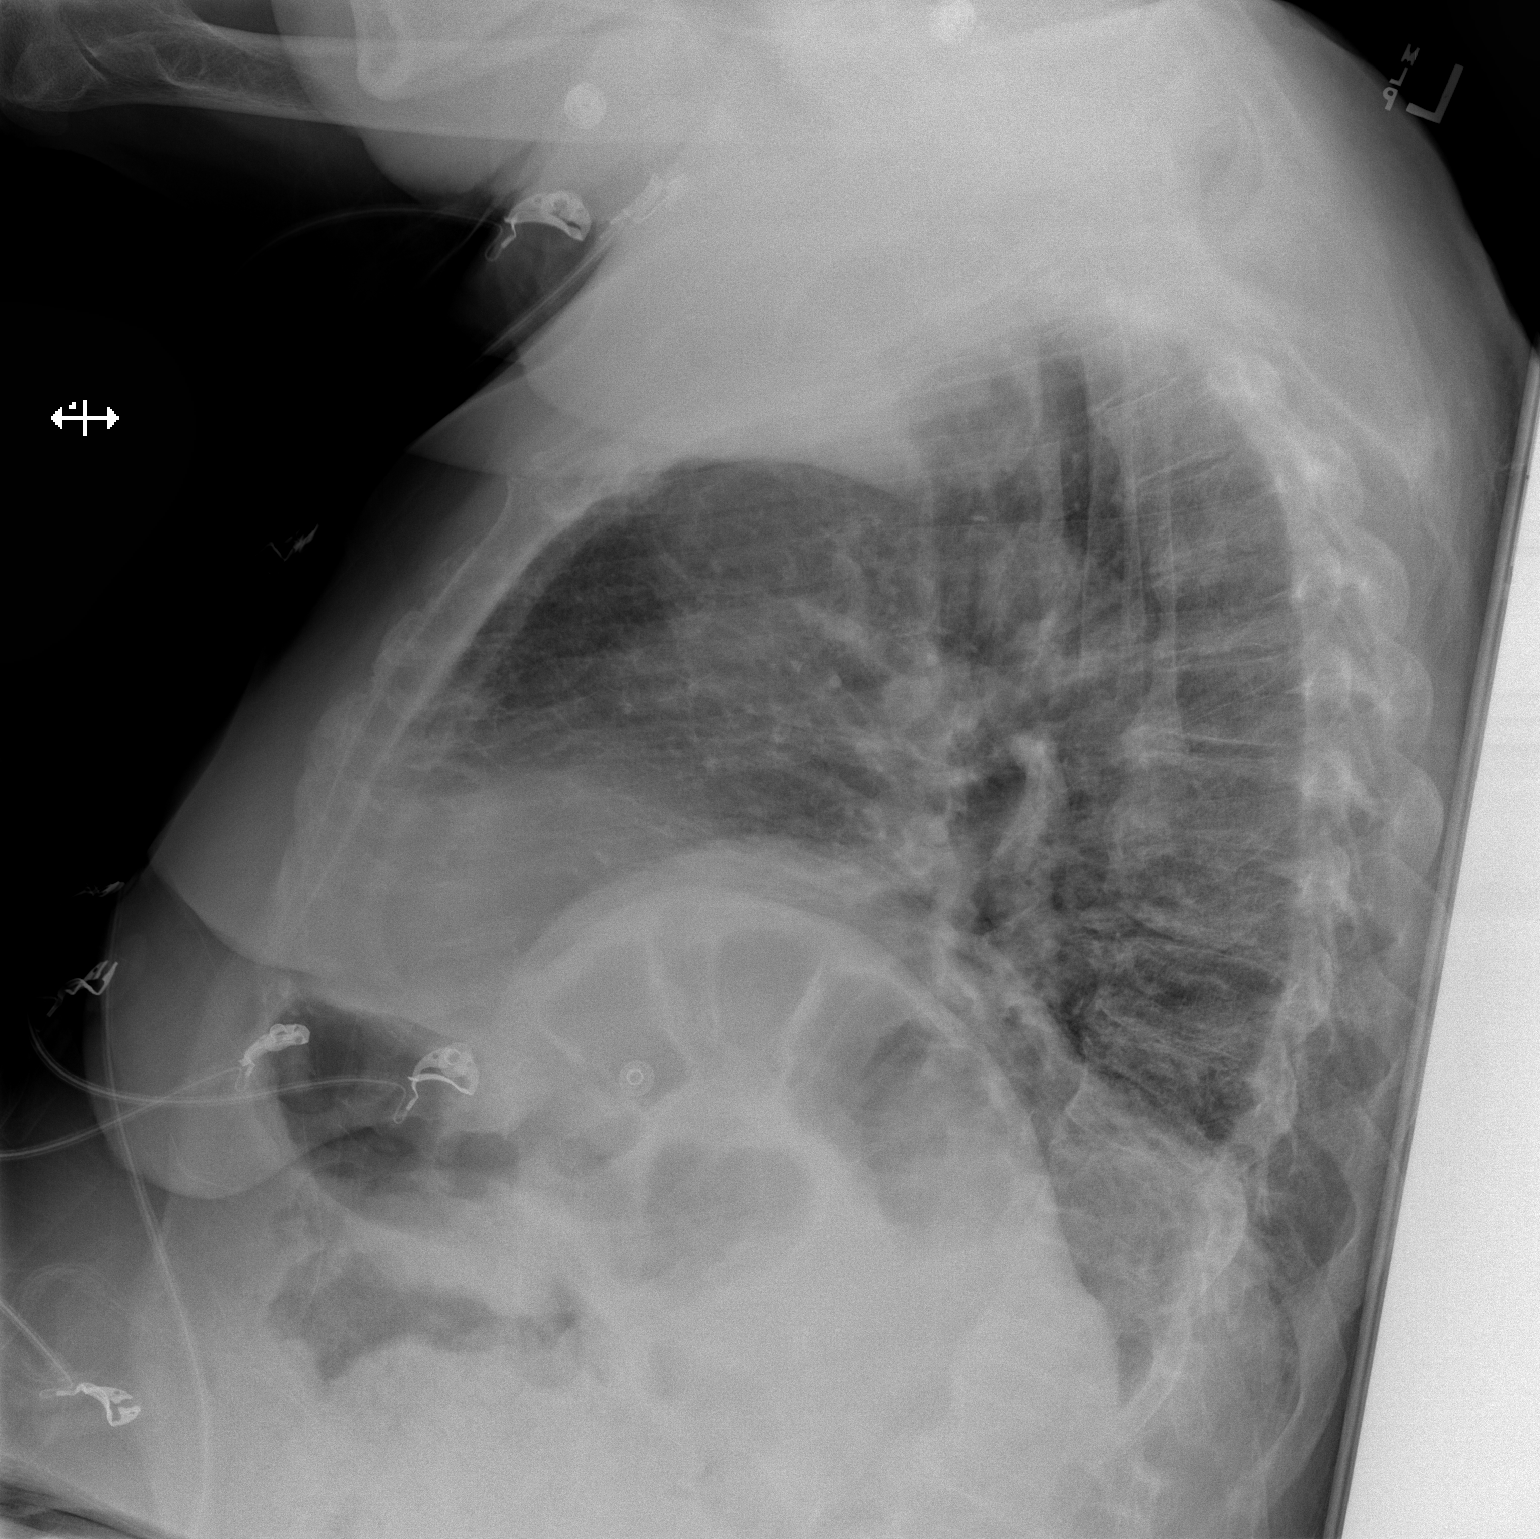

[x chest ap]
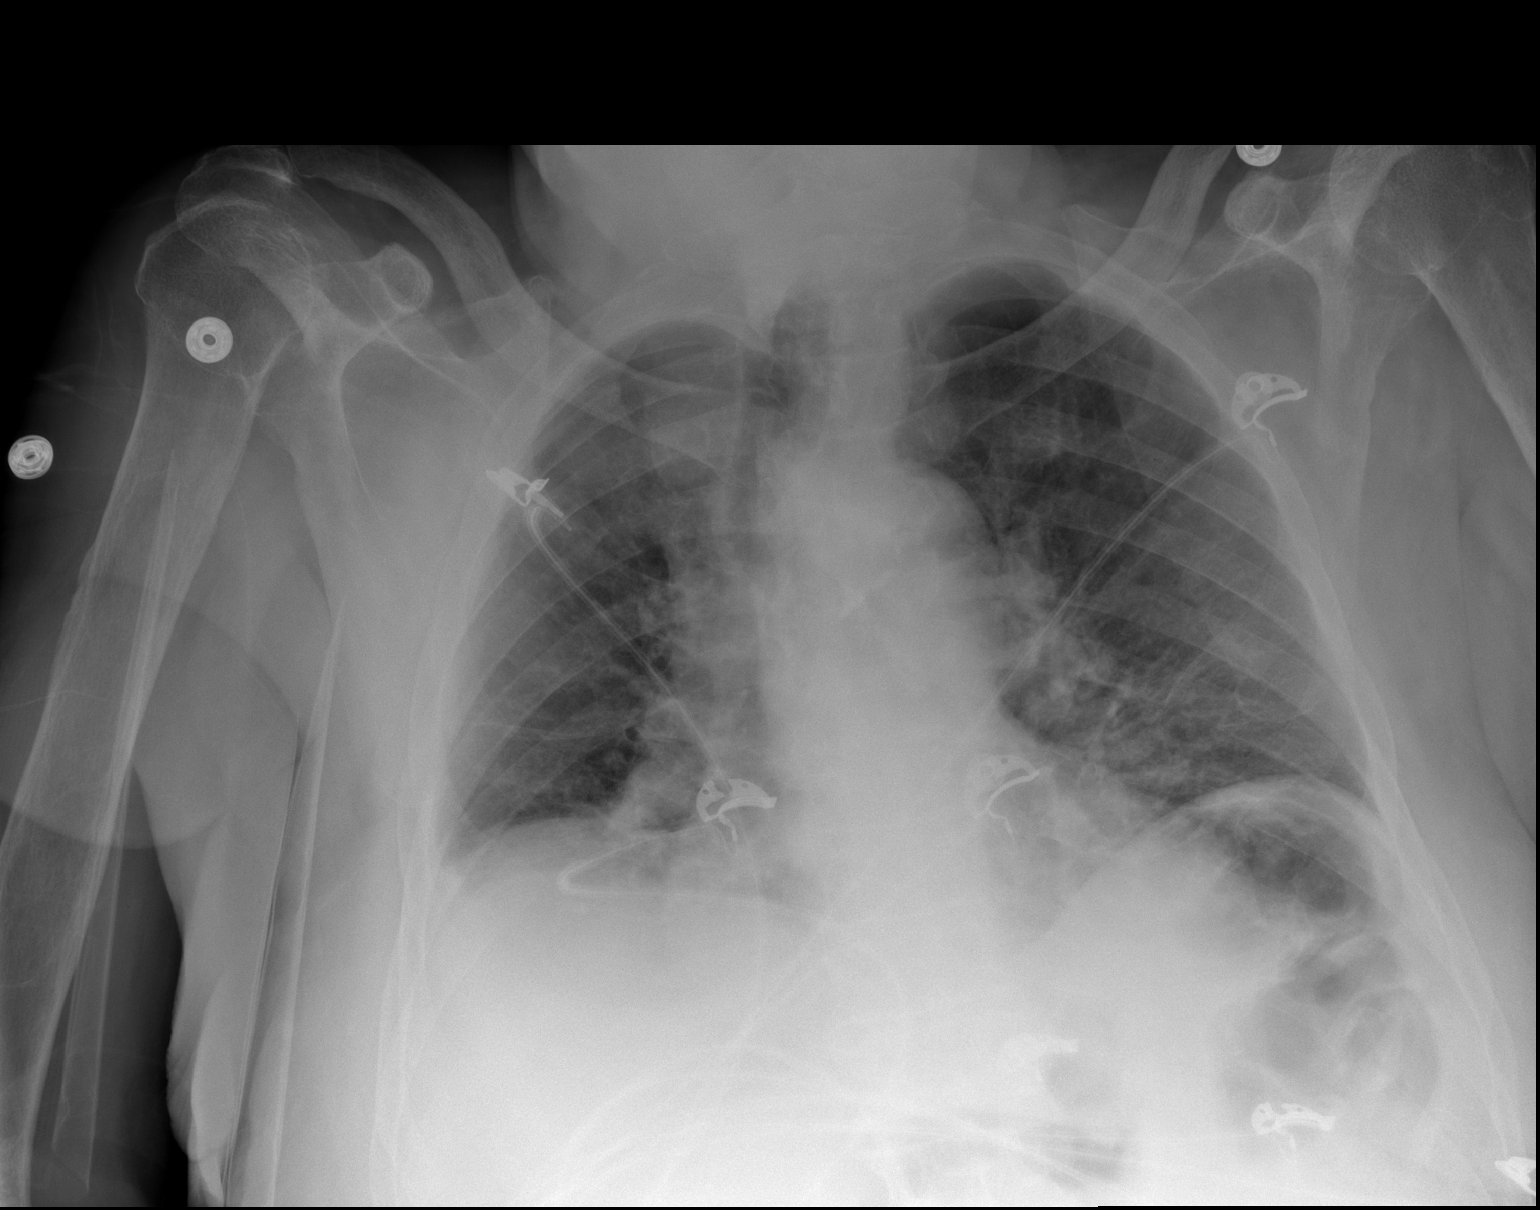

[2 of 2 positions shown; findings below may reference images not displayed]

FINDINGS: Mild cardiomegaly, stable. Mediastinal silhouette within normal
limits.

Lungs are hypoinflated. Perihilar vascular congestion without frank
pulmonary edema. Small right pleural effusion. Superimposed
bibasilar atelectatic changes. No consolidative opacity. No
pneumothorax.

No acute osseous abnormality.
IMPRESSION: 1. Stable cardiomegaly with mild perihilar vascular congestion
without overt pulmonary edema.
2. Small right pleural effusion.
3. Low lung volumes with superimposed bibasilar atelectatic changes.

## 2019-06-23 DIAGNOSIS — J449 Chronic obstructive pulmonary disease, unspecified: Secondary | ICD-10-CM | POA: Diagnosis not present

## 2019-07-02 DIAGNOSIS — C673 Malignant neoplasm of anterior wall of bladder: Secondary | ICD-10-CM | POA: Diagnosis not present

## 2019-07-02 DIAGNOSIS — C671 Malignant neoplasm of dome of bladder: Secondary | ICD-10-CM | POA: Diagnosis not present

## 2019-07-17 DIAGNOSIS — I9589 Other hypotension: Secondary | ICD-10-CM | POA: Diagnosis not present

## 2019-07-17 DIAGNOSIS — R531 Weakness: Secondary | ICD-10-CM | POA: Diagnosis not present

## 2019-07-17 DIAGNOSIS — I509 Heart failure, unspecified: Secondary | ICD-10-CM | POA: Diagnosis not present

## 2019-07-23 DIAGNOSIS — J449 Chronic obstructive pulmonary disease, unspecified: Secondary | ICD-10-CM | POA: Diagnosis not present

## 2019-08-02 IMAGING — DX PORTABLE CHEST - 1 VIEW
1 series · 1 of 1 positions shown · non-contrast
Comparison: One-view chest x-ray 02/15/2019

CLINICAL DATA: Shortness of breath.

EXAM:
PORTABLE CHEST 1 VIEW

[chest ap]
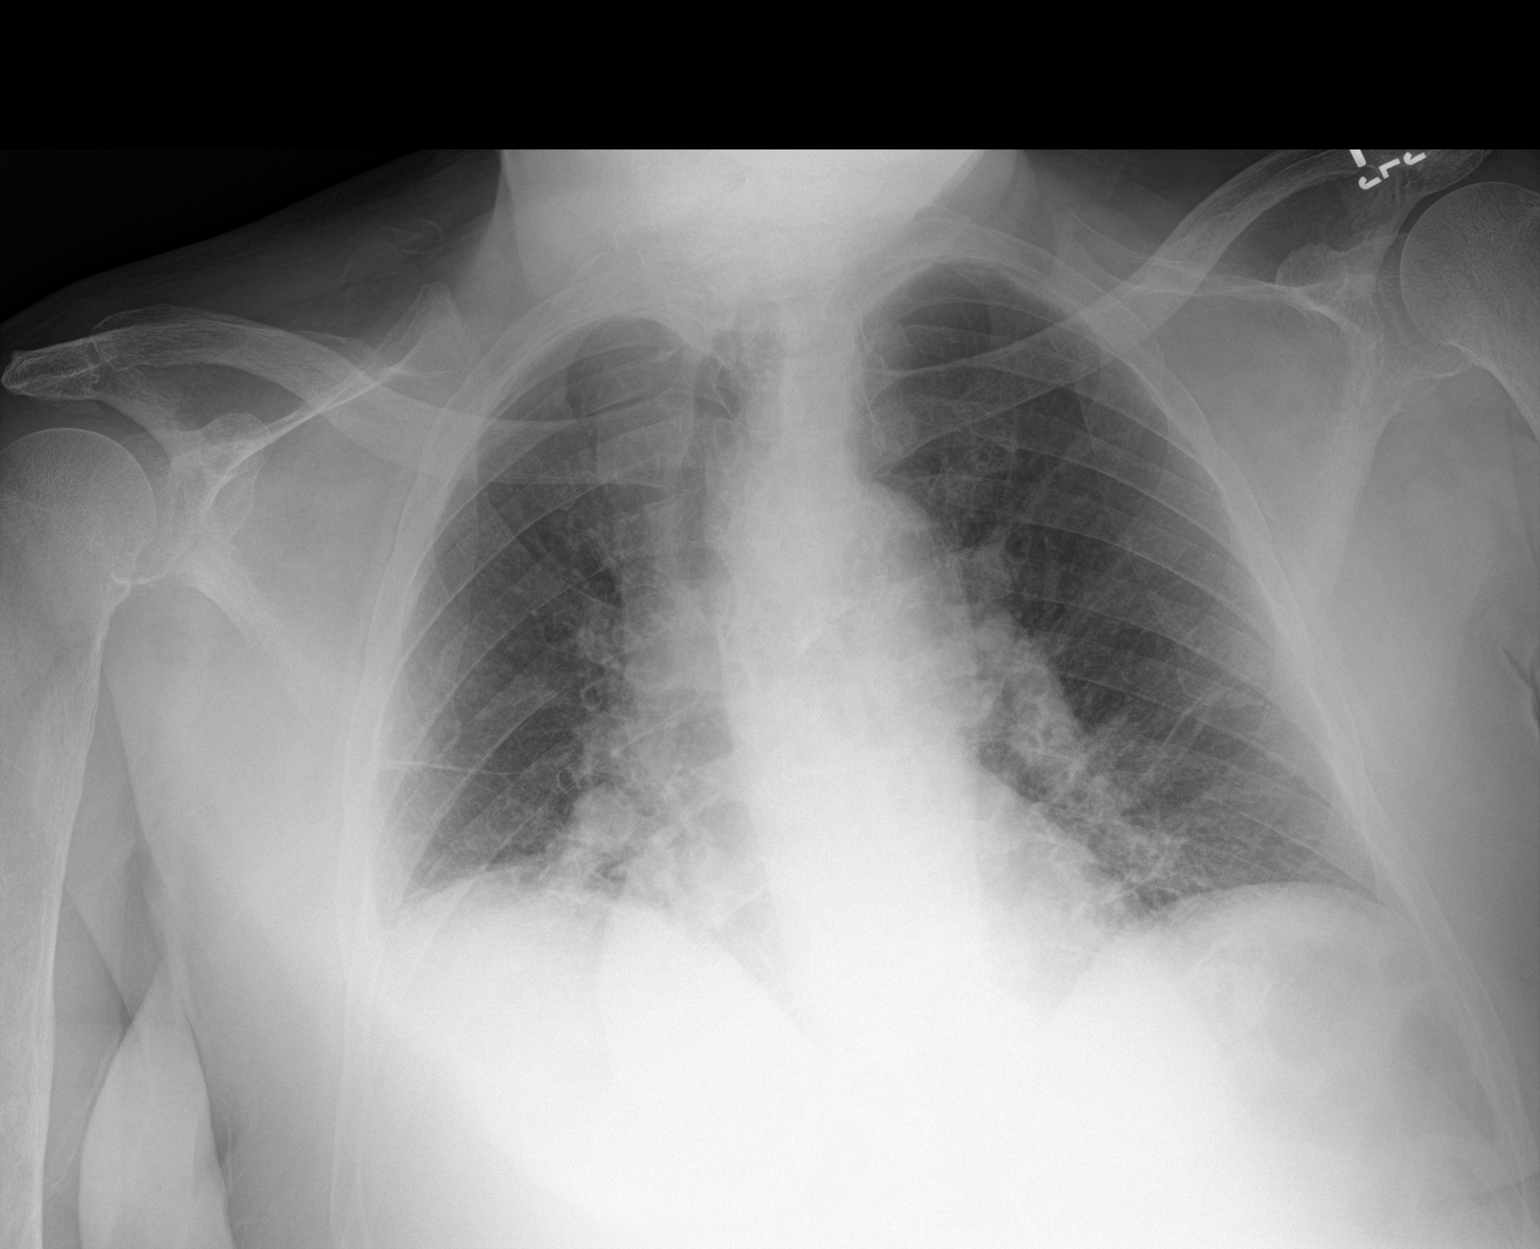

[1 of 1 positions shown; findings below may reference images not displayed]

FINDINGS: Heart size is exaggerated by low lung volumes. Right lower lobe
airspace disease is again seen. There is minimal atelectasis at the
left base. Pulmonary vascular congestion is noted. The visualized
soft tissues and bony thorax are unremarkable.
IMPRESSION: 1. Low lung volumes with progressive right lower lobe airspace
disease/pneumonia. Aspiration is also considered.
2. Moderate pulmonary vascular congestion.

## 2019-08-04 DIAGNOSIS — Z5111 Encounter for antineoplastic chemotherapy: Secondary | ICD-10-CM | POA: Diagnosis not present

## 2019-08-23 DIAGNOSIS — J449 Chronic obstructive pulmonary disease, unspecified: Secondary | ICD-10-CM | POA: Diagnosis not present

## 2019-09-22 DEATH — deceased
